# Patient Record
Sex: Male | Born: 1970 | Race: Black or African American | Hispanic: No | Marital: Married | Smoking: Never smoker
Health system: Southern US, Community
[De-identification: ages and names within clinical notes are randomized; demographics above are authoritative.]

## PROBLEM LIST (undated history)

## (undated) DIAGNOSIS — E785 Hyperlipidemia, unspecified: Secondary | ICD-10-CM

## (undated) DIAGNOSIS — I10 Essential (primary) hypertension: Secondary | ICD-10-CM

## (undated) DIAGNOSIS — R7303 Prediabetes: Secondary | ICD-10-CM

## (undated) DIAGNOSIS — E66812 Obesity, class 2: Secondary | ICD-10-CM

## (undated) HISTORY — DX: Essential (primary) hypertension: I10

## (undated) HISTORY — PX: APPENDECTOMY: SHX54

---
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
## 2010-01-23 ENCOUNTER — Emergency Department: Payer: Self-pay | Admitting: Emergency Medicine

## 2011-09-18 ENCOUNTER — Ambulatory Visit: Payer: Self-pay

## 2013-01-31 ENCOUNTER — Ambulatory Visit: Payer: Self-pay | Admitting: Family Medicine

## 2013-02-14 ENCOUNTER — Ambulatory Visit: Payer: Self-pay | Admitting: Family Medicine

## 2013-04-18 ENCOUNTER — Ambulatory Visit: Payer: Self-pay | Admitting: Family Medicine

## 2013-08-25 ENCOUNTER — Ambulatory Visit: Payer: Self-pay | Admitting: General Surgery

## 2013-09-06 ENCOUNTER — Ambulatory Visit: Payer: Self-pay | Admitting: General Surgery

## 2013-09-29 ENCOUNTER — Telehealth: Payer: Self-pay | Admitting: *Deleted

## 2013-09-29 NOTE — Telephone Encounter (Signed)
No show for appt. 

## 2014-04-18 ENCOUNTER — Encounter: Payer: Self-pay | Admitting: *Deleted

## 2014-04-26 ENCOUNTER — Encounter: Payer: Self-pay | Admitting: General Surgery

## 2014-04-26 ENCOUNTER — Ambulatory Visit (INDEPENDENT_AMBULATORY_CARE_PROVIDER_SITE_OTHER): Payer: BC Managed Care – PPO | Admitting: General Surgery

## 2014-04-26 VITALS — BP 134/80 | HR 66 | Resp 14 | Ht 70.0 in | Wt 252.0 lb

## 2014-04-26 DIAGNOSIS — K4090 Unilateral inguinal hernia, without obstruction or gangrene, not specified as recurrent: Secondary | ICD-10-CM

## 2014-04-26 NOTE — Progress Notes (Signed)
Patient ID: Jeffrey ModenaCalvin L Osmon, male   DOB: 1970-12-08, [age] y.o.   MRN: [id number]  Chief Complaint  Patient presents with  . Other    Evaluation of inguinal hernia    HPI Jeffrey Rivers is a 43 y.o. male who presents for an evaluation of an inguinal hernia. The patient states he noticed the hernia approximately 1 year ago. He states it has gotten larger. He has some irritation in that area from time to time. He also has an occasional burning sensation in that area. He is able to push the bulge back in.   HPI  Past Medical History  Diagnosis Date  . Hypertension     Past Surgical History  Procedure Laterality Date  . Appendectomy      Family History  Problem Relation Age of Onset  . Cancer Father 9960    colon    Social History History  Substance Use Topics  . Smoking status: Never Smoker   . Smokeless tobacco: Never Used  . Alcohol Use: No    No Known Allergies  Current Outpatient Prescriptions  Medication Sig Dispense Refill  . lisinopril (PRINIVIL,ZESTRIL) 40 MG tablet Take 1 tablet by mouth daily.  5  . tamsulosin (FLOMAX) 0.4 MG CAPS capsule Take 0.4 mg by mouth daily.   5   No current facility-administered medications for this visit.    Review of Systems Review of Systems  Constitutional: Negative.   Respiratory: Negative.   Cardiovascular: Negative.   Gastrointestinal: Negative.     Blood pressure 134/80, pulse 66, resp. rate 14, height 5\' 10"  (1.778 m), weight 252 lb (114.306 kg).  Physical Exam Physical Exam  Constitutional: He is oriented to person, place, and time. He appears well-developed and well-nourished.  Eyes: Conjunctivae are normal. No scleral icterus.  Neck: Neck supple. No thyromegaly present.  Cardiovascular: Normal rate, regular rhythm and normal heart sounds.   No murmur heard. Pulmonary/Chest: Effort normal and breath sounds normal.  Abdominal: Soft. Normal appearance and bowel sounds are normal. There is no hepatosplenomegaly. There  is no tenderness. A hernia is present. Hernia confirmed positive in the right inguinal area.    Lymphadenopathy:    He has no cervical adenopathy.  Neurological: He is alert and oriented to person, place, and time.  Skin: Skin is warm and dry.    Data Reviewed    Assessment    Right inguinal hernia.      Plan   Recommended repair. Feel open repair is reasonable given the significant scar associated in lower mid line. Procedure, risks and beefits, use of mesh all discussed. Pt is agreeable. [facility].       Sharlette Jansma G 04/27/2014, 6:21 AM

## 2014-04-26 NOTE — Patient Instructions (Addendum)
Open Hernia Repair Open hernia repair is surgery to fix a hernia. A hernia occurs when an internal organ or tissue pushes out through a weak spot in the abdominal wall muscles. Hernias commonly occur in the groin and around the navel. Most hernias tend to get worse over time. Surgery is often done to prevent the hernia from getting bigger, becoming uncomfortable, or becoming an emergency. Emergency surgery may be needed if abdominal contents get stuck in the opening (incarcerated hernia) or the blood supply gets cut off (strangulated hernia). In an open repair, a large cut (incision) is made in the abdomen to perform the surgery. LET [facility] CARE PROVIDER KNOW ABOUT:  Any allergies you have.  All medicines you are taking, including vitamins, herbs, eye drops, creams, and over-the-counter medicines.  Previous problems you or members of your family have had with the use of anesthetics.  Any blood disorders you have.  Previous surgeries you have had.  Medical conditions you have. RISKS AND COMPLICATIONS Generally, this is a safe procedure. However, as with any procedure, complications can occur. Possible complications include:  Infection.  Bleeding.  Nerve injury.  Chronic pain.  The hernia can come back.  Injury to the intestines. BEFORE THE PROCEDURE  Ask your health care provider about changing or stopping any regular medicines. Avoid taking aspirin or blood thinners as directed by your health care provider.  Do noteat or drink anything after midnight the night before surgery.  If you smoke, do not smoke for at least 2 weeks before your surgery.  Do not drink alcohol the day before your surgery.  Let your health care provider know if you develop a cold or any infection before your surgery.  Arrange for someone to drive you home after the procedure or after your hospital stay. Also arrange for someone to help you with activities during recovery. PROCEDURE   Small  monitors will be put on your body. They are used to check your heart, blood pressure, and oxygen level.   An IV access tube will be put into one of your veins. Medicine will be able to flow directly into your body through this IV tube.   You might be given a medicine to help you relax (sedative).   You will be given a medicine to make you sleep (general anesthetic). A breathing tube may be placed into your lungs during the procedure.  A cut (incision) is made over the hernia defect, and the contents are pushed back into the abdomen.  If the hernia is small, stitches may be used to bring the muscle edges back together.  Typically, a surgeon will place a mesh patch made of man-made material (synthetic) to cover the defect. The mesh is sewn to healthy muscle. This reduces the risk of the hernia coming back.  The tissue and skin over the hernia are then closed with stitches or staples.  If the hernia was large, a drain may be left in place to collect excess fluid where the hernia used to be.  Bandages (dressings) are used to cover the incision. AFTER THE PROCEDURE  You will be taken to a recovery area where your progress will be monitored.  If the hernia was small or in the groin (inguinal) region, you will likely be allowed to go home once you are awake, stable, and taking fluids well.  If the hernia was large, you may have to wait for your bowel function to return. You may need to stay in the hospital  for 2-3 days until you can eat and your pain is controlled. A drain may be left in place for 5-7 days. You will be taught how to care for the drain. Document Released: 11/26/2000 Document Revised: 03/23/2013 Document Reviewed: 01/12/2013 [facility] Patient Information 2015 [employer] [place]. This information is not intended to replace advice given to you by your health care provider. Make sure you discuss any questions you have with your health care provider.  Patient[facility] has been  [facility].

## 2014-04-27 ENCOUNTER — Encounter: Payer: Self-pay | Admitting: General Surgery

## 2014-05-26 ENCOUNTER — Other Ambulatory Visit: Payer: Self-pay | Admitting: General Surgery

## 2014-05-26 DIAGNOSIS — K4090 Unilateral inguinal hernia, without obstruction or gangrene, not specified as recurrent: Secondary | ICD-10-CM

## 2014-06-05 ENCOUNTER — Ambulatory Visit: Payer: Self-pay | Admitting: General Surgery

## 2014-06-05 ENCOUNTER — Encounter: Payer: Self-pay | Admitting: *Deleted

## 2014-06-05 DIAGNOSIS — K4090 Unilateral inguinal hernia, without obstruction or gangrene, not specified as recurrent: Secondary | ICD-10-CM

## 2014-06-05 HISTORY — PX: INGUINAL HERNIA REPAIR: SHX194

## 2014-06-05 HISTORY — PX: HERNIA REPAIR: SHX51

## 2014-06-22 ENCOUNTER — Ambulatory Visit (INDEPENDENT_AMBULATORY_CARE_PROVIDER_SITE_OTHER): Payer: Self-pay | Admitting: General Surgery

## 2014-06-22 ENCOUNTER — Encounter: Payer: Self-pay | Admitting: General Surgery

## 2014-06-22 VITALS — BP 140/90 | HR 76 | Resp 12 | Ht 70.0 in | Wt 249.0 lb

## 2014-06-22 DIAGNOSIS — K4090 Unilateral inguinal hernia, without obstruction or gangrene, not specified as recurrent: Secondary | ICD-10-CM

## 2014-06-22 NOTE — Patient Instructions (Addendum)
Patient to return in one month. Resume activities as tolerated.

## 2014-06-22 NOTE — Progress Notes (Signed)
This is a 44 year old male here today for his post op right inguinal hernia done on 06/05/14. Patient states he is doing well.  The right inguinal area is clean and healing well.Repair is intact. Patient to return on one month. Resume activities as tolerated.

## 2014-06-23 ENCOUNTER — Ambulatory Visit: Payer: Self-pay | Admitting: Family Medicine

## 2014-06-26 ENCOUNTER — Encounter: Payer: Self-pay | Admitting: *Deleted

## 2014-07-19 ENCOUNTER — Telehealth: Payer: Self-pay | Admitting: *Deleted

## 2014-07-19 NOTE — Telephone Encounter (Signed)
Left message for patient reminder appointment 07/20/14.

## 2014-07-20 ENCOUNTER — Encounter: Payer: Self-pay | Admitting: General Surgery

## 2014-07-20 ENCOUNTER — Ambulatory Visit (INDEPENDENT_AMBULATORY_CARE_PROVIDER_SITE_OTHER): Payer: Self-pay | Admitting: General Surgery

## 2014-07-20 VITALS — BP 130/80 | HR 76 | Resp 12 | Ht 70.0 in | Wt 252.0 lb

## 2014-07-20 DIAGNOSIS — K4090 Unilateral inguinal hernia, without obstruction or gangrene, not specified as recurrent: Secondary | ICD-10-CM

## 2014-07-20 NOTE — Progress Notes (Signed)
This is a 44 year old male here today for his post op right inguinal hernia done on 06/05/14. Patient states he is doing well.  The right inguinal area is clean and well healed. Repair is intact. Patient to return as needed. No activity restrictions.

## 2014-07-20 NOTE — Patient Instructions (Signed)
Patient to return as needed. 

## 2014-10-07 NOTE — Op Note (Signed)
PATIENT NAME:  Jeffrey Rivers, Jeffrey Rivers MR#:  [id number] DATE OF BIRTH:  Aug 13, 1970  DATE OF PROCEDURE:  06/05/2014  DATE OF PROCEDURE: 06/05/2014.   PREOPERATIVE DIAGNOSIS: Right inguinal hernia.   POSTOPERATIVE DIAGNOSIS: Right inguinal hernia.   OPERATION: Repair right inguinal hernia.   ANESTHESIA: General.   COMPLICATIONS: None.   ESTIMATED BLOOD LOSS: Minimal.   DRAINS: None.   DESCRIPTION OF PROCEDURE: The patient was put to sleep in the supine position on the operating room table. The right inguinal region was prepped and draped out as a sterile field. A timeout was performed. The landmarks were marked in the anterior/superior iliac spine and the pubic tubercle and 0.5% Marcaine with 1% Xylocaine was instilled along the inguinal canal and along the medial two thirds skin incision was made. It was then deepened through a fairly substantial layer of fatty tissue constituting the Camper's fascia and then the Scarpa fascia. Bleeding was controlled with cautery and ligatures of 3-0 Vicryl. The external oblique was exposed and was then opened along the line of its fibers through the external ring. Exploration revealed that the patient, in fact, had a small lipoma on an indirect sac but did not have a direct-type hernia. This lipoma and hernia were then freed and a high ligation with 2-0 Vicryl stitch was performed and amputated. This suture was used to transfix the stump to the undersurface of the internal oblique. The fascia covering the cord was reapproximated with 2-0 Vicryl. After ensuring proper anatomical landmarks it was noted that the ilioinguinal nerve was at a high level and did not come into the area of repair.  A Parietex  Pro-Grip mesh was then brought up and traced around the internal ring was snugged down to the posterior wall of the inguinal canal, tucked underneath the external oblique laterally. The medial end was tacked to the pubic tubercle with a single 2-0 PDS stitch. The external  oblique was closed over with a running 2-0 PDS. The wound was irrigated and closed in layers with 3-0 Vicryl and the skin with subcuticular 4-0 Vicryl, covered with LiquiBand.   The procedure was well tolerated. He was subsequently extubated and returned to the recovery room in stable condition   ____________________________ S.Wynona LunaG. Detra Bores, MD sgs:at D: 06/05/2014 08:47:09 ET T: 06/05/2014 08:58:41 ET JOB#: [id number]  cc: Timoteo ExposeS.G. Evette CristalSankar, MD, <Dictator> [facility] Wynona LunaG Tomeka Kantner MD ELECTRONICALLY SIGNED 06/06/2014 8:44

## 2015-04-12 ENCOUNTER — Other Ambulatory Visit: Payer: Self-pay | Admitting: Family Medicine

## 2015-04-12 NOTE — Telephone Encounter (Signed)
I am forwarding this encounter to the designated PCP and/or their nursing staff for further management of the tasks requested. Thank you.  

## 2015-08-23 ENCOUNTER — Emergency Department: Payer: 59

## 2015-08-23 ENCOUNTER — Encounter: Payer: Self-pay | Admitting: Emergency Medicine

## 2015-08-23 ENCOUNTER — Emergency Department
Admission: EM | Admit: 2015-08-23 | Discharge: 2015-08-23 | Disposition: A | Discharge status: Home / Self Care | Payer: 59 | Attending: Emergency Medicine | Admitting: Emergency Medicine

## 2015-08-23 DIAGNOSIS — I10 Essential (primary) hypertension: Secondary | ICD-10-CM | POA: Diagnosis not present

## 2015-08-23 DIAGNOSIS — R42 Dizziness and giddiness: Secondary | ICD-10-CM | POA: Diagnosis not present

## 2015-08-23 DIAGNOSIS — M25512 Pain in left shoulder: Secondary | ICD-10-CM | POA: Insufficient documentation

## 2015-08-23 DIAGNOSIS — Z79899 Other long term (current) drug therapy: Secondary | ICD-10-CM | POA: Diagnosis not present

## 2015-08-23 LAB — CBC
HCT: 44.4 % (ref 40.0–52.0)
Hemoglobin: 14.8 g/dL (ref 13.0–18.0)
MCH: 28 pg (ref 26.0–34.0)
MCHC: 33.5 g/dL (ref 32.0–36.0)
MCV: 83.5 fL (ref 80.0–100.0)
Platelets: 184 10*3/uL (ref 150–440)
RBC: 5.31 MIL/uL (ref 4.40–5.90)
RDW: 14.4 % (ref 11.5–14.5)
WBC: 4.5 10*3/uL (ref 3.8–10.6)

## 2015-08-23 LAB — BASIC METABOLIC PANEL
Anion gap: 6 (ref 5–15)
BUN: 17 mg/dL (ref 6–20)
CO2: 28 mmol/L (ref 22–32)
Calcium: 9.3 mg/dL (ref 8.9–10.3)
Chloride: 106 mmol/L (ref 101–111)
Creatinine, Ser: 1.04 mg/dL (ref 0.61–1.24)
GFR calc Af Amer: >60 mL/min (ref >60)
GFR calc non Af Amer: >60 mL/min (ref >60)
Glucose, Bld: 96 mg/dL (ref 65–99)
Potassium: 4.1 mmol/L (ref 3.5–5.1)
Sodium: 140 mmol/L (ref 135–145)

## 2015-08-23 LAB — TROPONIN I: Troponin I: <0.03 ng/mL (ref <0.031)

## 2015-08-23 NOTE — ED Notes (Signed)
Pt reports right shoulder pain x2 days and dizziness today; sent here from urgent care with copy of EKG. Attached to chart.

## 2015-08-23 NOTE — ED Provider Notes (Signed)
[facility] Emergency Department Provider Note  ____________________________________________  Time seen: 7:00 PM  I have reviewed the triage vital signs and the nursing notes.   HISTORY  Chief Complaint Shoulder Pain and Dizziness    HPI Jeffrey Rivers is a 45 y.o. male who complains of left shoulder pain that started about 7:00 AM today. He was just in the superior aspect of the shoulder on the left. Not worse with movement. He was at work doing usual activities when this happened. He has been doing increased weight lifting recently which may be related. He was not in the chest or neck. No headache vision changes.He did feel somewhat dizzy. He drank some water and the dizziness resolved and he felt better. He then took an aspirin and the shoulder pain resolved. He continued to work the rest of the day without difficulty and felt back to normal. He went to urgent care after work were EKG was abnormal so they sent him to the ED for evaluation. He denies any exertional symptoms, nor orthopnea, no pleuritic symptoms. History of hypertension but no diabetes hyperlipidemia smoking or early CAD in his family.     Past Medical History  Diagnosis Date  . Hypertension      There are no active problems to display for this patient.    Past Surgical History  Procedure Laterality Date  . Appendectomy    . Hernia repair Right 06/05/14     Current Outpatient Rx  Name  Route  Sig  Dispense  Refill  . ADVAIR DISKUS 250-50 MCG/DOSE AEPB      INHALE 1 PUFF BY MOUTH TWICE DAILY   1 each   0   . lisinopril (PRINIVIL,ZESTRIL) 40 MG tablet   Oral   Take 1 tablet by mouth daily.      5   . tamsulosin (FLOMAX) 0.4 MG CAPS capsule   Oral   Take 0.4 mg by mouth daily.       5      Allergies Review of patient's allergies indicates no known allergies.   Family History  Problem Relation Age of Onset  . Cancer Father 69    colon    Social  History Social History  Substance Use Topics  . Smoking status: Never Smoker   . Smokeless tobacco: Never Used  . Alcohol Use: No    Review of Systems  Constitutional:   No fever or chills. No weight changes Eyes:   No blurry vision or double vision.  ENT:   No sore throat.  Cardiovascular:   No chest pain. Respiratory:   No dyspnea or cough. Gastrointestinal:   Negative for abdominal pain, vomiting and diarrhea.  No BRBPR or melena. Genitourinary:   Negative for dysuria or difficulty urinating. Musculoskeletal:   Left shoulder pain as above. Skin:   Negative for rash. Neurological:   Negative for headaches, focal weakness or numbness. Psychiatric:  No anxiety or depression.   Endocrine:  No changes in energy or sleep difficulty.  10-point ROS otherwise negative.  ____________________________________________   PHYSICAL EXAM:  VITAL SIGNS: ED Triage Vitals  Enc Vitals Group     BP 08/23/15 1853 135/96 mmHg     Pulse Rate 08/23/15 1853 65     Resp 08/23/15 1853 18     Temp 08/23/15 1853 97.9 F (36.6 C)     Temp Source 08/23/15 1853 Oral     SpO2 08/23/15 1853 100 %     Weight 08/23/15 1853  250 lb (113.399 kg)     Height 08/23/15 1853 5\' 10"  (1.778 m)     Head Cir --      Peak Flow --      Pain Score 08/23/15 1801 0     Pain Loc --      Pain Edu? --      Excl. in GC? --     Vital signs reviewed, nursing assessments reviewed.   Constitutional:   Alert and oriented. Well appearing and in no distress. Eyes:   No scleral icterus. No conjunctival pallor. PERRL. EOMI ENT   Head:   Normocephalic and atraumatic.   Nose:   No congestion/rhinnorhea. No septal hematoma   Mouth/Throat:   MMM, no pharyngeal erythema. No peritonsillar mass.    Neck:   No stridor. No SubQ emphysema. No meningismus. No bruit Hematological/Lymphatic/Immunilogical:   No cervical lymphadenopathy. Cardiovascular:   RRR. Symmetric bilateral radial and DP pulses.  No murmurs.   Respiratory:   Normal respiratory effort without tachypnea nor retractions. Breath sounds are clear and equal bilaterally. No wheezes/rales/rhonchi. Musculoskeletal:   Nontender with normal range of motion in all extremities. No joint effusions.  No lower extremity tenderness.  No edema. No shoulder tenderness. No pain with axial loading of the C-spine. Neurologic:   Normal speech and language.  CN 2-10 normal. Motor grossly intact. No gross focal neurologic deficits are appreciated.   Psychiatric:   Mood and affect are normal. ____________________________________________    LABS (pertinent positives/negatives) (all labs ordered are listed, but only abnormal results are displayed) Labs Reviewed  BASIC METABOLIC PANEL  TROPONIN I  CBC   ____________________________________________   EKG  Interpreted by me Normal sinus rhythm rate of 62, normal axis, normal intervals. High voltage QRS complexes. Criteria for LVH. Normal ST segments. Isolated T-wave inversion in lead 3 which is nonspecific and is likely repolarization abnormality in setting of LVH  ____________________________________________    RADIOLOGY  Chest x-ray unremarkable  ____________________________________________   PROCEDURES   ____________________________________________   INITIAL IMPRESSION / ASSESSMENT AND PLAN / ED COURSE  Pertinent labs & imaging results that were available during my care of the patient were reviewed by me and considered in my medical decision making (see chart for details).  Patient presents with vague left shoulder pain in the superior aspect. Nonreproducible on exam, but does not sound at all like anything related to his cardiovascular system, lungs, or a neurologic event. He is well-appearing, no acute distress. Symptoms resolved as of 9 AM, at least 9 hours prior to ED evaluation. Labs chest x-ray EKG are all unremarkable except for LVH. We'll have him follow-up with cardiology  and primary care.     ____________________________________________   FINAL CLINICAL IMPRESSION(S) / ED DIAGNOSES  Final diagnoses:  Shoulder pain, acute, left      Sharman CheekPhillip Earlean Fidalgo, MD 08/23/15 1921

## 2015-08-23 NOTE — ED Notes (Signed)
MD Stafford at bedside. 

## 2015-08-23 NOTE — Discharge Instructions (Signed)
Heat Therapy °Heat therapy can help ease sore, stiff, injured, and tight muscles and joints. Heat relaxes your muscles, which may help ease your pain.  °RISKS AND COMPLICATIONS °If you have any of the following conditions, do not use heat therapy unless your health care provider has approved: °· Poor circulation. °· Healing wounds or scarred skin in the area being treated. °· Diabetes, heart disease, or high blood pressure. °· Not being able to feel (numbness) the area being treated. °· Unusual swelling of the area being treated. °· Active infections. °· Blood clots. °· Cancer. °· Inability to communicate pain. This may include young children and people who have problems with their brain function (dementia). °· Pregnancy. °Heat therapy should only be used on old, pre-existing, or long-lasting (chronic) injuries. Do not use heat therapy on new injuries unless directed by your health care provider. °HOW TO USE HEAT THERAPY °There are several different kinds of heat therapy, including: °· Moist heat pack. °· Warm water bath. °· Hot water bottle. °· Electric heating pad. °· Heated gel pack. °· Heated wrap. °· Electric heating pad. °Use the heat therapy method suggested by your health care provider. Follow your health care provider's instructions on when and how to use heat therapy. °GENERAL HEAT THERAPY RECOMMENDATIONS °· Do not sleep while using heat therapy. Only use heat therapy while you are awake. °· Your skin may turn pink while using heat therapy. Do not use heat therapy if your skin turns red. °· Do not use heat therapy if you have new pain. °· High heat or long exposure to heat can cause burns. Be careful when using heat therapy to avoid burning your skin. °· Do not use heat therapy on areas of your skin that are already irritated, such as with a rash or sunburn. °SEEK MEDICAL CARE IF: °· You have blisters, redness, swelling, or numbness. °· You have new pain. °· Your pain is worse. °MAKE SURE  YOU: °· Understand these instructions. °· Will watch your condition. °· Will get help right away if you are not doing well or get worse. °  °This information is not intended to replace advice given to you by your health care provider. Make sure you discuss any questions you have with your health care provider. °  °Document Released: 08/25/2011 Document Revised: 06/23/2014 Document Reviewed: 07/26/2013 °Elsevier Interactive Patient Education ©2016 [employer] ° °Shoulder Pain °The shoulder is the joint that connects your arms to your body. The bones that form the shoulder joint include the upper arm bone (humerus), the shoulder blade (scapula), and the collarbone (clavicle). The top of the humerus is shaped like a ball and fits into a rather flat socket on the scapula (glenoid cavity). A combination of muscles and strong, fibrous tissues that connect muscles to bones (tendons) support your shoulder joint and hold the ball in the socket. Small, fluid-filled sacs (bursae) are located in different areas of the joint. They act as cushions between the bones and the overlying soft tissues and help reduce friction between the gliding tendons and the bone as you move your arm. Your shoulder joint allows a wide range of motion in your arm. This range of motion allows you to do things like scratch your back or throw a ball. However, this range of motion also makes your shoulder more prone to pain from overuse and injury. °Causes of shoulder pain can originate from both injury and overuse and usually can be grouped in the following four categories: °· Redness,   swelling, and pain (inflammation) of the tendon (tendinitis) or the bursae (bursitis). °· Instability, such as a dislocation of the joint. °· Inflammation of the joint (arthritis). °· Broken bone (fracture). °HOME CARE INSTRUCTIONS  °· Apply ice to the sore area. °¨ Put ice in a plastic bag. °¨ Place a towel between your skin and the bag. °¨ Leave the ice on for 15-20  minutes, 3-4 times per day for the first 2 days, or as directed by your health care provider. °· Stop using cold packs if they do not help with the pain. °· If you have a shoulder sling or immobilizer, wear it as long as your caregiver instructs. Only remove it to shower or bathe. Move your arm as little as possible, but keep your hand moving to prevent swelling. °· Squeeze a soft ball or foam pad as much as possible to help prevent swelling. °· Only take over-the-counter or prescription medicines for pain, discomfort, or fever as directed by your caregiver. °SEEK MEDICAL CARE IF:  °· Your shoulder pain increases, or new pain develops in your arm, hand, or fingers. °· Your hand or fingers become cold and numb. °· Your pain is not relieved with medicines. °SEEK IMMEDIATE MEDICAL CARE IF:  °· Your arm, hand, or fingers are numb or tingling. °· Your arm, hand, or fingers are significantly swollen or turn white or blue. °MAKE SURE YOU:  °· Understand these instructions. °· Will watch your condition. °· Will get help right away if you are not doing well or get worse. °  °This information is not intended to replace advice given to you by your health care provider. Make sure you discuss any questions you have with your health care provider. °  °Document Released: 03/12/2005 Document Revised: 06/23/2014 Document Reviewed: 09/25/2014 °Elsevier Interactive Patient Education ©2016 [employer] ° °

## 2015-09-10 ENCOUNTER — Other Ambulatory Visit: Payer: Self-pay

## 2015-09-10 MED ORDER — LISINOPRIL 40 MG PO TABS: 40.0000 mg | ORAL_TABLET | Freq: Every day | ORAL | Status: DC

## 2015-10-08 ENCOUNTER — Encounter: Payer: Self-pay | Admitting: Family Medicine

## 2015-10-17 ENCOUNTER — Encounter: Payer: Self-pay | Admitting: Family Medicine

## 2015-11-02 ENCOUNTER — Other Ambulatory Visit: Payer: Self-pay | Admitting: Family Medicine

## 2015-11-02 NOTE — Telephone Encounter (Signed)
Patient requesting refill. 

## 2015-11-05 ENCOUNTER — Encounter: Payer: Self-pay | Admitting: Family Medicine

## 2015-11-05 ENCOUNTER — Ambulatory Visit (INDEPENDENT_AMBULATORY_CARE_PROVIDER_SITE_OTHER): Payer: 59 | Admitting: Family Medicine

## 2015-11-05 VITALS — BP 118/78 | HR 72 | Temp 97.8°F | Resp 14 | Wt 249.0 lb

## 2015-11-05 DIAGNOSIS — I517 Cardiomegaly: Secondary | ICD-10-CM | POA: Diagnosis not present

## 2015-11-05 DIAGNOSIS — R55 Syncope and collapse: Secondary | ICD-10-CM

## 2015-11-05 DIAGNOSIS — R39198 Other difficulties with micturition: Secondary | ICD-10-CM | POA: Diagnosis not present

## 2015-11-05 DIAGNOSIS — Z5181 Encounter for therapeutic drug level monitoring: Secondary | ICD-10-CM | POA: Diagnosis not present

## 2015-11-05 DIAGNOSIS — R000 Tachycardia, unspecified: Secondary | ICD-10-CM

## 2015-11-05 DIAGNOSIS — Z13220 Encounter for screening for lipoid disorders: Secondary | ICD-10-CM

## 2015-11-05 DIAGNOSIS — R9431 Abnormal electrocardiogram [ECG] [EKG]: Secondary | ICD-10-CM | POA: Diagnosis not present

## 2015-11-05 DIAGNOSIS — I10 Essential (primary) hypertension: Secondary | ICD-10-CM | POA: Diagnosis not present

## 2015-11-05 DIAGNOSIS — Z125 Encounter for screening for malignant neoplasm of prostate: Secondary | ICD-10-CM | POA: Insufficient documentation

## 2015-11-05 HISTORY — DX: Essential (primary) hypertension: I10

## 2015-11-05 MED ORDER — LISINOPRIL 40 MG PO TABS: 40.0000 mg | ORAL_TABLET | Freq: Every day | ORAL | Status: DC

## 2015-11-05 MED ORDER — TAMSULOSIN HCL 0.4 MG PO CAPS: 0.4000 mg | ORAL_CAPSULE | Freq: Every day | ORAL | Status: DC

## 2015-11-05 NOTE — Progress Notes (Signed)
BP 118/78 mmHg  Pulse 72  Temp(Src) 97.8 F (36.6 C) (Oral)  Resp 14  Wt 249 lb (112.946 kg)  SpO2 96%   Subjective:    Patient ID: Jeffrey Rivers, male    DOB: [birth year], 45 y.o.   MRN: [id number]  HPI: Jeffrey Rivers is a 45 y.o. male  Chief Complaint  Patient presents with  . 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; no chest pain No chest pain; he is having SHOB No leg edema He knows he needs to lose weight EKG done today; see copy on the chart; interpreted by me; compared to previous, relatively unchanged from last ER EKG  ER note reviewed; he was on Advair in Nov/Dec but not taking for months CBC, tropinin I, BMP all normal in the ER No hx of scarlet fever; no one in the family  [facility] 2[facility] 11/05/2015  Decreased Interest 0  Down, Depressed, Hopeless 0  PHQ - 2 Score 0   Relevant past medical, surgical, family and social history reviewed Past Medical History  Diagnosis Date  . Hypertension   . Essential hypertension, benign 11/05/2015   Past Surgical History  Procedure Laterality Date  . Appendectomy    . Hernia repair Right 06/05/14   Family History  Problem Relation Age of Onset  . Cancer Father 4860    colon  negative for heart disease Diabetes - uncles Social History  Substance Use Topics  . Smoking status: Never Smoker   . Smokeless tobacco: Never Used  . Alcohol Use: No   Interim  medical history since last visit reviewed. Allergies and medications reviewed  Review of Systems Per HPI unless specifically indicated above     Objective:    BP 118/78 mmHg  Pulse 72  Temp(Src) 97.8 F (36.6 C) (Oral)  Resp 14  Wt 249 lb (112.946 kg)  SpO2 96%  Wt Readings from Last 3 Encounters:  11/05/15 249 lb (112.946 kg)  08/23/15 250 lb (113.399 kg)  07/20/14 252 lb (114.306 kg)   body mass index is 35.73 kg/(m^2).  Physical Exam  Constitutional: He appears well-developed and well-nourished. No distress.  obese  HENT:  Head: Normocephalic and atraumatic.  Eyes: EOM are normal. No scleral icterus.  Neck: Carotid bruit is not present. No thyromegaly present.  Cardiovascular: Normal rate and regular rhythm.   No extrasystoles are present. PMI is not displaced.  Exam reveals no gallop.   No murmur heard. Pulses:      Radial pulses are 2+ on the right side, and 2+ on the left side.  Pulmonary/Chest: Effort normal and breath sounds normal.  Abdominal: Soft. Bowel sounds are normal. He exhibits no distension.  Musculoskeletal: He exhibits no edema.  Neurological: He is alert. He displays no tremor. No cranial nerve deficit. Coordination and gait normal.  Skin: Skin is warm and dry. He is  not diaphoretic. No pallor.  Psychiatric: He has a normal mood and affect. His behavior is normal. Judgment and thought content normal. His mood appears not anxious. He does not exhibit a depressed mood.      Assessment & Plan:   Problem List Items Addressed This Visit      Cardiovascular and Mediastinum   Essential hypertension, benign    Controlled today; weight loss and DASH guidelines would be helpful      Relevant Medications   lisinopril (PRINIVIL,ZESTRIL) 40 MG tablet   Other Relevant Orders   Basic metabolic panel (Completed)   LVH (left ventricular hypertrophy)    And abnormal EKG; i am concerned about the possible of LVOT obstruction causing his symptoms; urgent  referral to cardiology; no strenuous activities, including energetic preaching; reasons to seek medical attention, call 911 reviewed; patient agrees      Relevant Medications   lisinopril (PRINIVIL,ZESTRIL) 40 MG tablet   Other Relevant Orders   Ambulatory referral to Cardiology     Other   Medication monitoring encounter   Relevant Orders   Basic metabolic panel (Completed)   Prostate cancer screening   Relevant Orders   PSA (Completed)   Tachycardia   Relevant Orders   TSH (Completed)    Other Visit Diagnoses    Syncope, near    -  Primary    which sounds more cardiogenic in nature, given the abnormal EKG; refer to cardiologist for urgent work-up, echo; no strenuous activities in the meantime    Relevant Medications    lisinopril (PRINIVIL,ZESTRIL) 40 MG tablet    Other Relevant Orders    EKG 12-Lead    Ambulatory referral to Cardiology    Abnormal EKG        Relevant Orders    Ambulatory referral to Cardiology    Basic metabolic panel (Completed)    TSH (Completed)    Slow urinary stream        Relevant Orders    PSA (Completed)    Lipid screening        Relevant Orders    Lipid Panel w/o Chol/HDL Ratio (Completed)       Follow up plan: Return in about 2 weeks (around 11/19/2015).  An after-visit summary was printed and given to the patient at check-out.  Please see the patient instructions which may contain other information and recommendations beyond what is mentioned above in the assessment and plan.  Meds ordered this encounter  Medications  . lisinopril (PRINIVIL,ZESTRIL) 40 MG tablet    Sig: Take 1 tablet (40 mg total) by mouth daily.    Dispense:  30 tablet    Refill:  5  . tamsulosin (FLOMAX) 0.4 MG CAPS capsule    Sig: Take 1 capsule (0.4 mg total) by mouth daily.    Dispense:  30 capsule    Refill:  5   Orders Placed This Encounter  Procedures  . Basic metabolic panel  . Lipid Panel w/o Chol/HDL Ratio  . PSA  . TSH  . Ambulatory referral to  Cardiology  . EKG 12-Lead

## 2015-11-05 NOTE — Patient Instructions (Addendum)
We'll have you see the cardiologist today or tomorrow Limit your physical activity and stress; do not exert yourself physically until cleared by cardiologist If you feel light-headed, stop what you doing, sit down, drink fluids If you develop chest pain, then call 911 Let's get bloodwork today If you have not heard anything from my staff by tomorrow at lunch about any orders/referrals/studies from today, please contact us here to follow-up [phone]

## 2015-11-06 ENCOUNTER — Ambulatory Visit: Payer: Self-pay | Admitting: Family Medicine

## 2015-11-06 ENCOUNTER — Telehealth: Payer: Self-pay | Admitting: Family Medicine

## 2015-11-06 LAB — LIPID PANEL W/O CHOL/HDL RATIO
Cholesterol, Total: 217 mg/dL — ABNORMAL HIGH (ref 100–199)
HDL: 59 mg/dL (ref >39)
LDL Calculated: 146 mg/dL — ABNORMAL HIGH (ref 0–99)
Triglycerides: 62 mg/dL (ref 0–149)
VLDL Cholesterol Cal: 12 mg/dL (ref 5–40)

## 2015-11-06 LAB — BASIC METABOLIC PANEL
BUN/Creatinine Ratio: 15 (ref 9–20)
BUN: 16 mg/dL (ref 6–24)
CO2: 23 mmol/L (ref 18–29)
Calcium: 9.5 mg/dL (ref 8.7–10.2)
Chloride: 101 mmol/L (ref 96–106)
Creatinine, Ser: 1.08 mg/dL (ref 0.76–1.27)
GFR calc Af Amer: 97 mL/min/{1.73_m2} (ref >59)
GFR calc non Af Amer: 84 mL/min/{1.73_m2} (ref >59)
Glucose: 95 mg/dL (ref 65–99)
Potassium: 4.4 mmol/L (ref 3.5–5.2)
Sodium: 141 mmol/L (ref 134–144)

## 2015-11-06 LAB — TSH: TSH: 1.19 u[IU]/mL (ref 0.450–4.500)

## 2015-11-06 LAB — PSA: Prostate Specific Ag, Serum: 0.5 ng/mL (ref 0.0–4.0)

## 2015-11-06 NOTE — Telephone Encounter (Signed)
Checking status on lab results, please return call

## 2015-11-07 NOTE — Telephone Encounter (Signed)
I spoke with patient; he did a holter monitor; he has not heard anything back yet; they did not do an echo (?); that was specifically in my referral note I talked with patient about labs; cholesterol modestly elevated (total and LDL) but excellent HDL; he'll try to lose a little weight and cut down on saturated fats; recheck in 3-6 months ----------------- Jeffrey Rivers, Please call cardiology office; I was really hoping that they would get an echocardiogram; I'm concerned about possible LVOT obstruction If they are not planning on getting an echo, then let's order one to be done ASAP Request their note too, please; I don't see him in CareEverywhere; thank you

## 2015-11-08 NOTE — Telephone Encounter (Signed)
They are faxing not and he has an echo and stress test scheduled on the 16th

## 2015-11-08 NOTE — Telephone Encounter (Signed)
Thank you :)

## 2015-11-17 NOTE — Assessment & Plan Note (Addendum)
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

## 2015-11-17 NOTE — Assessment & Plan Note (Signed)
Controlled today; weight loss and DASH guidelines would be helpful

## 2015-11-19 ENCOUNTER — Ambulatory Visit: Payer: 59 | Admitting: Family Medicine

## 2015-11-29 ENCOUNTER — Ambulatory Visit (INDEPENDENT_AMBULATORY_CARE_PROVIDER_SITE_OTHER): Payer: 59 | Admitting: Family Medicine

## 2015-11-29 ENCOUNTER — Encounter: Payer: Self-pay | Admitting: Family Medicine

## 2015-11-29 VITALS — BP 112/82 | HR 65 | Temp 97.8°F | Resp 18 | Ht 70.0 in | Wt 249.2 lb

## 2015-11-29 DIAGNOSIS — N138 Other obstructive and reflux uropathy: Secondary | ICD-10-CM | POA: Insufficient documentation

## 2015-11-29 DIAGNOSIS — N401 Enlarged prostate with lower urinary tract symptoms: Secondary | ICD-10-CM

## 2015-11-29 DIAGNOSIS — I10 Essential (primary) hypertension: Secondary | ICD-10-CM

## 2015-11-29 DIAGNOSIS — I517 Cardiomegaly: Secondary | ICD-10-CM

## 2015-11-29 NOTE — Progress Notes (Signed)
BP 112/82 mmHg  Pulse 65  Temp(Src) 97.8 F (36.6 C) (Oral)  Resp 18  Ht  (1.778 m)  Wt 249 lb 3.2 oz (113.036 kg)  BMI 35.76 kg/m2  SpO2 98%   Subjective:    Patient ID: Jeffrey Rivers, male    DOB: 19-Jun-1970, 45 y.o.   MRN: [id number]  HPI: Jeffrey Rivers is a 45 y.o. male  Chief Complaint  Patient presents with  . Hypertension    has not taken medication in 1 month. Makes him dizzy, drained, has cardiology appoinment tomorrow   He has been feeling good off of the medicine Not dizzy and not light-headed No chest pain No swelling in the legs Quit medicine about a month ago; no bad reactions Does some fast food, some canned beans No decongestants  Went to see cardiologist; stress test pending Supposed to have stress test tomorrow but will reschedule Not much physical work Tested for sleep apnea already last year, weight same as last year Going on vacation next week He would love to lose 10-20 pounds  Mentions BPH; will refer to urology  Depression screen [facility] 2/9 11/29/2015 11/05/2015  Decreased Interest 0 0  Down, Depressed, Hopeless 0 0  PHQ - 2 Score 0 0   Relevant past medical, surgical, family and social history reviewed Past Medical History  Diagnosis Date  . Hypertension   . Essential hypertension, benign 11/05/2015   Social History  Substance Use Topics  . Smoking status: Never Smoker   . Smokeless tobacco: Never Used  . Alcohol Use: No   Interim medical history since last visit reviewed. Allergies and medications reviewed  Review of Systems Per HPI unless specifically indicated above     Objective:    BP 112/82 mmHg  Pulse 65  Temp(Src) 97.8 F (36.6 C) (Oral)  Resp 18  Ht  (1.778 m)  Wt 249 lb 3.2 oz (113.036 kg)  BMI 35.76 kg/m2  SpO2 98%  Wt Readings from Last 3 Encounters:  11/29/15 249 lb 3.2 oz (113.036 kg)  11/05/15 249 lb (112.946 kg)  08/23/15 250 lb (113.399 kg)    Physical Exam  Constitutional: He appears  well-developed and well-nourished. No distress.  Weight stable  Cardiovascular: Normal rate and regular rhythm.   Pulmonary/Chest: Effort normal and breath sounds normal.  Abdominal: He exhibits no distension.  Musculoskeletal: He exhibits no edema.  Psychiatric: He has a normal mood and affect.  Vitals reviewed.     Assessment & Plan:   Problem List Items Addressed This Visit      Cardiovascular and Mediastinum   LVH (left ventricular hypertrophy)    Seeing cardiologist; BP controlled today; so important to work on weight loss and he'll try to lose 10-20 pounds over the upcoming few months      Essential hypertension, benign - Primary    Controlled today; ork on weight loss and DASH guidelines        Genitourinary   BPH (benign prostatic hypertrophy) with urinary obstruction    Refer to urologist      Relevant Orders   Ambulatory referral to Urology      Follow up plan: Return in about 3 months (around 02/29/2016) for follow-up.  An after-visit summary was printed and given to the patient at check-out.  Please see the patient instructions which may contain other information and recommendations beyond what is mentioned above in the assessment and plan.  Orders Placed This Encounter  Procedures  .  Ambulatory referral to Urology

## 2015-11-29 NOTE — Patient Instructions (Addendum)
Check out the information at familydoctor.org entitled "Nutrition for Weight Loss: What You Need to Know about Fad Diets" Try to lose between 1-2 pounds per week by taking in fewer calories and burning off more calories You can succeed by limiting portions, limiting foods dense in calories and fat, becoming more active, and drinking 8 glasses of water a day (64 ounces) Don't skip meals, especially breakfast, as skipping meals may alter your metabolism Do not use over-the-counter weight loss pills or gimmicks that claim rapid weight loss A healthy BMI (or body mass index) is between 18.5 and 24.9 You can calculate your ideal BMI at the NIH website JobEconomics.hu  No strenuous activity until cleared by cardiologist  We'll have you see the urologist  Your goal blood pressure is less than 140 mmHg on top. Try to follow the DASH guidelines (DASH stands for Dietary Approaches to Stop Hypertension) Try to limit the sodium in your diet.  Ideally, consume less than 1.5 grams (less than 1,500mg ) per day. Do not add salt when cooking or at the table.  Check the sodium amount on labels when shopping, and choose items lower in sodium when given a choice. Avoid or limit foods that already contain a lot of sodium. Eat a diet rich in fruits and vegetables and whole grains.  DASH Eating Plan DASH stands for "Dietary Approaches to Stop Hypertension." The DASH eating plan is a healthy eating plan that has been shown to reduce high blood pressure (hypertension). Additional health benefits may include reducing the risk of type 2 diabetes mellitus, heart disease, and stroke. The DASH eating plan may also help with weight loss. WHAT DO I NEED TO KNOW ABOUT THE DASH EATING PLAN? For the DASH eating plan, you will follow these general guidelines:  Choose foods with a percent daily value for sodium of less than 5% (as listed on the food label).  Use salt-free  seasonings or herbs instead of table salt or sea salt.  Check with your health care provider or pharmacist before using salt substitutes.  Eat lower-sodium products, often labeled as "lower sodium" or "no salt added."  Eat fresh foods.  Eat more vegetables, fruits, and low-fat dairy products.  Choose whole grains. Look for the word "whole" as the first word in the ingredient list.  Choose fish and skinless chicken or [place] more often than red meat. Limit fish, poultry, and meat to 6 oz (170 g) each day.  Limit sweets, desserts, sugars, and sugary drinks.  Choose heart-healthy fats.  Limit cheese to 1 oz (28 g) per day.  Eat more home-cooked food and less restaurant, buffet, and fast food.  Limit fried foods.  Cook foods using methods other than frying.  Limit canned vegetables. If you do use them, rinse them well to decrease the sodium.  When eating at a restaurant, ask that your food be prepared with less salt, or no salt if possible. WHAT FOODS CAN I EAT? Seek help from a dietitian for individual calorie needs. Grains Whole grain or whole wheat bread. Brown rice. Whole grain or whole wheat pasta. Quinoa, bulgur, and whole grain cereals. Low-sodium cereals. Corn or whole wheat flour tortillas. Whole grain cornbread. Whole grain crackers. Low-sodium crackers. Vegetables Fresh or frozen vegetables (raw, steamed, roasted, or grilled). Low-sodium or reduced-sodium tomato and vegetable juices. Low-sodium or reduced-sodium tomato sauce and paste. Low-sodium or reduced-sodium canned vegetables.  Fruits All fresh, canned (in natural juice), or frozen fruits. Meat and Other Protein Products Ground beef (85%  or leaner), grass-fed beef, or beef trimmed of fat. Skinless chicken or [place]. Ground chicken or [place]. Pork trimmed of fat. All fish and seafood. Eggs. Dried beans, peas, or lentils. Unsalted nuts and seeds. Unsalted canned beans. Dairy Low-fat dairy products, such as skim or  1% milk, 2% or reduced-fat cheeses, low-fat ricotta or cottage cheese, or plain low-fat yogurt. Low-sodium or reduced-sodium cheeses. Fats and Oils Tub margarines without trans fats. Light or reduced-fat mayonnaise and salad dressings (reduced sodium). Avocado. Safflower, olive, or canola oils. Natural peanut or almond butter. Other Unsalted popcorn and pretzels. The items listed above may not be a complete list of recommended foods or beverages. Contact your dietitian for more options. WHAT FOODS ARE NOT RECOMMENDED? Grains White bread. White pasta. White rice. Refined cornbread. Bagels and croissants. Crackers that contain trans fat. Vegetables Creamed or fried vegetables. Vegetables in a cheese sauce. Regular canned vegetables. Regular canned tomato sauce and paste. Regular tomato and vegetable juices. Fruits Dried fruits. Canned fruit in light or heavy syrup. Fruit juice. Meat and Other Protein Products Fatty cuts of meat. Ribs, chicken wings, bacon, sausage, bologna, salami, chitterlings, fatback, hot dogs, bratwurst, and packaged luncheon meats. Salted nuts and seeds. Canned beans with salt. Dairy Whole or 2% milk, cream, half-and-half, and cream cheese. Whole-fat or sweetened yogurt. Full-fat cheeses or blue cheese. Nondairy creamers and whipped toppings. Processed cheese, cheese spreads, or cheese curds. Condiments Onion and garlic salt, seasoned salt, table salt, and sea salt. Canned and packaged gravies. Worcestershire sauce. Tartar sauce. Barbecue sauce. Teriyaki sauce. Soy sauce, including reduced sodium. Steak sauce. Fish sauce. Oyster sauce. Cocktail sauce. Horseradish. Ketchup and mustard. Meat flavorings and tenderizers. Bouillon cubes. Hot sauce. Tabasco sauce. Marinades. Taco seasonings. Relishes. Fats and Oils Butter, stick margarine, lard, shortening, ghee, and bacon fat. Coconut, palm kernel, or palm oils. Regular salad dressings. Other Pickles and olives. Salted popcorn  and pretzels. The items listed above may not be a complete list of foods and beverages to avoid. Contact your dietitian for more information. WHERE CAN I FIND MORE INFORMATION? National Heart, Lung, and Blood Institute: CablePromo.itwww.nhlbi.nih.gov/health/health-topics/topics/dash/   This information is not intended to replace advice given to you by your health care provider. Make sure you discuss any questions you have with your health care provider.   Document Released: 05/22/2011 Document Revised: 06/23/2014 Document Reviewed: 04/06/2013 Elsevier Interactive Patient Education [employer]2016 Elsevier Inc.

## 2015-12-15 NOTE — Assessment & Plan Note (Signed)
Seeing cardiologist; BP controlled today; so important to work on weight loss and he'll try to lose 10-20 pounds over the upcoming few months

## 2015-12-15 NOTE — Assessment & Plan Note (Signed)
Controlled today; ork on weight loss and DASH guidelines

## 2015-12-15 NOTE — Assessment & Plan Note (Signed)
Refer to urologist 

## 2015-12-19 ENCOUNTER — Encounter: Payer: Self-pay | Admitting: Urology

## 2015-12-19 ENCOUNTER — Ambulatory Visit (INDEPENDENT_AMBULATORY_CARE_PROVIDER_SITE_OTHER): Payer: 59 | Admitting: Urology

## 2015-12-19 VITALS — BP 152/93 | HR 76 | Ht 70.0 in | Wt 250.0 lb

## 2015-12-19 DIAGNOSIS — R3911 Hesitancy of micturition: Secondary | ICD-10-CM | POA: Diagnosis not present

## 2015-12-19 DIAGNOSIS — N401 Enlarged prostate with lower urinary tract symptoms: Secondary | ICD-10-CM

## 2015-12-19 DIAGNOSIS — N138 Other obstructive and reflux uropathy: Secondary | ICD-10-CM

## 2015-12-19 DIAGNOSIS — I10 Essential (primary) hypertension: Secondary | ICD-10-CM | POA: Insufficient documentation

## 2015-12-19 LAB — URINALYSIS, COMPLETE
Bilirubin, UA: NEGATIVE
Glucose, UA: NEGATIVE
Ketones, UA: NEGATIVE
Leukocytes, UA: NEGATIVE
Nitrite, UA: NEGATIVE
Protein, UA: NEGATIVE
RBC, UA: NEGATIVE
Specific Gravity, UA: >1.03 — ABNORMAL HIGH (ref 1.005–1.030)
Urobilinogen, Ur: 0.2 mg/dL (ref 0.2–1.0)
pH, UA: 5 (ref 5.0–7.5)

## 2015-12-19 LAB — MICROSCOPIC EXAMINATION
Bacteria, UA: NONE SEEN
Epithelial Cells (non renal): NONE SEEN /hpf (ref 0–10)

## 2015-12-19 LAB — BLADDER SCAN AMB NON-IMAGING

## 2015-12-19 MED ORDER — ALFUZOSIN HCL ER 10 MG PO TB24: 10.0000 mg | ORAL_TABLET | Freq: Every day | ORAL | Status: DC

## 2015-12-19 NOTE — Progress Notes (Signed)
Consult: BPH with lower urinary tract symptoms Referred by: Dr. Sherie DonLada  Chief Complaint: Urgency, hesitancy and weak stream  History of Present Illness: Patient reports history of BPH and symptoms of weak stream, frequency, urgency, nocturia, intermittent flow, hesitancy and straining to void. He's had symptoms for several months. He tried tamsulosin which improved his symptoms but gave him dizziness. He denies trouble getting or maintaining an erection. He is most bothered hesitancy and weak stream which follows an urge to void.  AUA symptom score of 23, quality of life unhappy. Symptoms of incomplete emptying, frequency, intermittency, urgency, weak stream, straining, nocturia. Flow symptoms more predominant.   No dysuria or gross hematuria. No NG risk.   PSA was 0.19 Oct 2015. He has no family history of prostate cancer.  UA today normal. 0-5 white cells, 0-2 red cell, no bacteria.  He is married with one child. His wife is 43 yo and they desire no more children.   PVR = 0 ml   Past Medical History  Diagnosis Date  . Hypertension   . Essential hypertension, benign 11/05/2015   Past Surgical History  Procedure Laterality Date  . Appendectomy    . Hernia repair Right 06/05/14    Home Medications:   (Not in a hospital admission) Allergies: No Known Allergies  Family History  Problem Relation Age of Onset  . Cancer Father 4960    colon   Social History:  reports that he has never smoked. He has never used smokeless tobacco. He reports that he does not drink alcohol or use illicit drugs.  ROS: A complete review of systems was performed.  All systems are negative except for pertinent findings as noted. ROS   Physical Exam:  Vital signs in last 24 hours: @VSRANGES @ General:  Alert and oriented, No acute distress HEENT: Normocephalic, atraumatic Cardiovascular: Regular rate and rhythm Lungs: Regular rate and effort Abdomen: Soft, nontender, nondistended, no abdominal  masses Back: No CVA tenderness Extremities: No edema Neurologic: Grossly intact GU: The penis is circumcised and without mass or lesion. The testicles were descended bilaterally and palpably normal. I did not appreciate any inguinal hernias. DRE: The prostate was palpably normal and about 30 g in size. The prostate was smooth and without hard area or nodule.  Laboratory Data:  No results found for this or any previous visit (from the past 24 hour(s)). No results found for this or any previous visit (from the past 240 hour(s)). Creatinine: No results for input(s): CREATININE in the last 168 hours.  Impression/Assessment/plan:  BPH with LUTS  - etiologies include possible urethral stricture, BPH with bladder outlet obstruction, bladder neck obstruction and even overactive bladder. We discussed the nature risks and benefits of Alfuzosin and he elected to try that medicine. He'll follow-up for cystoscopy to evaluate the urethra and the prostate. Depending on his anatomy something like a Urolift or transurethral incision of the bladder neck may be less invasive than other procedures. We did discuss risk of permanent retrograde ejaculation with procedures, but patient desires no more children.   Jeffrey Rivers 12/19/2015, 3:42 PM

## 2016-01-14 ENCOUNTER — Ambulatory Visit (INDEPENDENT_AMBULATORY_CARE_PROVIDER_SITE_OTHER): Payer: 59 | Admitting: Urology

## 2016-01-14 ENCOUNTER — Encounter: Payer: Self-pay | Admitting: Urology

## 2016-01-14 VITALS — BP 133/83 | HR 78 | Ht 70.0 in | Wt 253.0 lb

## 2016-01-14 DIAGNOSIS — N401 Enlarged prostate with lower urinary tract symptoms: Secondary | ICD-10-CM | POA: Diagnosis not present

## 2016-01-14 DIAGNOSIS — N138 Other obstructive and reflux uropathy: Secondary | ICD-10-CM

## 2016-01-14 MED ORDER — CIPROFLOXACIN HCL 500 MG PO TABS
500.0000 mg | ORAL_TABLET | Freq: Once | ORAL | Status: AC
  Administered 2016-01-14: 500 mg via ORAL

## 2016-01-14 MED ORDER — LIDOCAINE HCL 2 % EX GEL
1.0000 "application " | Freq: Once | CUTANEOUS | Status: AC
  Administered 2016-01-14: 1 via URETHRAL

## 2016-01-14 NOTE — Progress Notes (Signed)
Patient returns with h/o BPH and symptoms of weak stream, frequency, urgency, nocturia, intermittent flow, hesitancy and straining to void. He's had symptoms for several months. He tried tamsulosin which improved his symptoms but gave him dizziness. He denies trouble getting or maintaining an erection. He is most bothered hesitancy and weak stream which follows an urge to void. AUA symptom score of 23, quality of life unhappy. Symptoms of incomplete emptying, frequency, intermittency, urgency, weak stream, straining, nocturia. Flow symptoms more predominant. No dysuria or gross hematuria. No NG risk. His PVR was 0 ml.   PSA was 0.19 Oct 2015. He has no family history of prostate cancer. DRE normal Jul 2017.   He is married with one child. His wife is 49 yo and they desire no more children.   Today, patient is seen for the above. He returns for cystoscopy. He tried alfuzosin but it made him feel drowsy and gave him heart palpitations and he stopped. Of note it did improve his weak stream.  Physical exam: No acute distress Alert and oriented No focal neurologic deficits Respiratory-normal rate and effort Extremity-no edema  Procedure: Cystoscopy-after consent was obtained patient was placed supine and prepped and draped in the usual sterile fashion. Lidocaine jelly was instilled per urethra. The cystoscope was passed per urethra and the bladder inspected. Findings: The urethra was normal, the lateral lobes of the prostate were nonobstructing but he had a prominent obstructing median lobe. The trigone and ureteral orifices were in their normal orthotopic position with clear efflux. The bladder was unremarkable.  Assessment/plan: Patient with predominant flow symptoms and although alpha blockers improve his symptoms he cannot tolerate the side effects. He has a prominent median lobe on exam and would likely do well with a TURP as his prostate is not very big but the median lobe appears obstructive. We  discussed the nature risks benefits and alternatives to TURP including continued surveillance, HoLEP and GLPVP. A drama picture the anatomy and we discussed in most cases flow symptoms drastically improve after these procedures and frequency and urgency usually improve but sometimes remain the same or rarely worsen. We also discussed risks of retrograde ejaculation, bleeding, stricture, incontinence and erectile dysfunction among others. He will consider. In the meantime will give him a trial of Rapaflo. He'll return in a few months.

## 2016-01-15 LAB — MICROSCOPIC EXAMINATION
Bacteria, UA: NONE SEEN
Epithelial Cells (non renal): NONE SEEN /hpf (ref 0–10)
WBC, UA: NONE SEEN /hpf (ref 0–?)

## 2016-01-15 LAB — URINALYSIS, COMPLETE
Bilirubin, UA: NEGATIVE
Glucose, UA: NEGATIVE
Ketones, UA: NEGATIVE
Leukocytes, UA: NEGATIVE
Nitrite, UA: NEGATIVE
Protein, UA: NEGATIVE
RBC, UA: NEGATIVE
Specific Gravity, UA: >1.03 — ABNORMAL HIGH (ref 1.005–1.030)
Urobilinogen, Ur: 0.2 mg/dL (ref 0.2–1.0)
pH, UA: 5 (ref 5.0–7.5)

## 2016-02-29 ENCOUNTER — Ambulatory Visit: Payer: 59 | Admitting: Family Medicine

## 2016-03-03 ENCOUNTER — Ambulatory Visit: Payer: 59 | Admitting: Family Medicine

## 2016-03-17 ENCOUNTER — Ambulatory Visit (INDEPENDENT_AMBULATORY_CARE_PROVIDER_SITE_OTHER): Payer: 59 | Admitting: Family Medicine

## 2016-03-17 ENCOUNTER — Encounter: Payer: Self-pay | Admitting: Family Medicine

## 2016-03-17 DIAGNOSIS — N401 Enlarged prostate with lower urinary tract symptoms: Secondary | ICD-10-CM

## 2016-03-17 DIAGNOSIS — I517 Cardiomegaly: Secondary | ICD-10-CM

## 2016-03-17 DIAGNOSIS — Z6836 Body mass index (BMI) 36.0-36.9, adult: Secondary | ICD-10-CM

## 2016-03-17 DIAGNOSIS — R5383 Other fatigue: Secondary | ICD-10-CM | POA: Diagnosis not present

## 2016-03-17 DIAGNOSIS — I10 Essential (primary) hypertension: Secondary | ICD-10-CM | POA: Diagnosis not present

## 2016-03-17 DIAGNOSIS — E6609 Other obesity due to excess calories: Secondary | ICD-10-CM | POA: Diagnosis not present

## 2016-03-17 DIAGNOSIS — E785 Hyperlipidemia, unspecified: Secondary | ICD-10-CM | POA: Diagnosis not present

## 2016-03-17 DIAGNOSIS — N138 Other obstructive and reflux uropathy: Secondary | ICD-10-CM | POA: Diagnosis not present

## 2016-03-17 NOTE — Assessment & Plan Note (Signed)
Check lipids (fasting); limit saturated fats 

## 2016-03-17 NOTE — Assessment & Plan Note (Signed)
Encouraged pt to call cardiologist and see about other testing in light of concerns regarding radiation, such as echo

## 2016-03-17 NOTE — Assessment & Plan Note (Signed)
Check multiple labs; encouraged 7 hours of sleep a night

## 2016-03-17 NOTE — Patient Instructions (Addendum)
Do check with your cardiologist about any other kind of stress testing, if you are concerned about radiation  Return for fasting, first morning labs some time soon  Try to limit saturated fats in your diet (bologna, hot dogs, barbeque, cheeseburgers, hamburgers, steak, bacon, sausage, cheese, etc.) and get more fresh fruits, vegetables, and whole grains  Your goal blood pressure is less than 140 mmHg on top. Try to follow the DASH guidelines (DASH stands for Dietary Approaches to Stop Hypertension) Try to limit the sodium in your diet.  Ideally, consume less than 1.5 grams (less than 1,500mg ) per day. Do not add salt when cooking or at the table.  Check the sodium amount on labels when shopping, and choose items lower in sodium when given a choice. Avoid or limit foods that already contain a lot of sodium. Eat a diet rich in fruits and vegetables and whole grains.  Check out the information at familydoctor.org entitled "Nutrition for Weight Loss: What You Need to Know about Fad Diets" Try to lose between 1-2 pounds per week by taking in fewer calories and burning off more calories You can succeed by limiting portions, limiting foods dense in calories and fat, becoming more active, and drinking 8 glasses of water a day (64 ounces) Don't skip meals, especially breakfast, as skipping meals may alter your metabolism Do not use over-the-counter weight loss pills or gimmicks that claim rapid weight loss A healthy BMI (or body mass index) is between 18.5 and 24.9 You can calculate your ideal BMI at the NIH website JobE[employer]ics.hu   DASH Eating Plan DASH stands for "Dietary Approaches to Stop Hypertension." The DASH eating plan is a healthy eating plan that has been shown to reduce high blood pressure (hypertension). Additional health benefits may include reducing the risk of type 2 diabetes mellitus, heart disease, and stroke. The DASH eating plan may  also help with weight loss. WHAT DO I NEED TO KNOW ABOUT THE DASH EATING PLAN? For the DASH eating plan, you will follow these general guidelines:  Choose foods with a percent daily value for sodium of less than 5% (as listed on the food label).  Use salt-free seasonings or herbs instead of table salt or sea salt.  Check with your health care provider or pharmacist before using salt substitutes.  Eat lower-sodium products, often labeled as "lower sodium" or "no salt added."  Eat fresh foods.  Eat more vegetables, fruits, and low-fat dairy products.  Choose whole grains. Look for the word "whole" as the first word in the ingredient list.  Choose fish and skinless chicken or [place] more often than red meat. Limit fish, poultry, and meat to 6 oz (170 g) each day.  Limit sweets, desserts, sugars, and sugary drinks.  Choose heart-healthy fats.  Limit cheese to 1 oz (28 g) per day.  Eat more home-cooked food and less restaurant, buffet, and fast food.  Limit fried foods.  Cook foods using methods other than frying.  Limit canned vegetables. If you do use them, rinse them well to decrease the sodium.  When eating at a restaurant, ask that your food be prepared with less salt, or no salt if possible. WHAT FOODS CAN I EAT? Seek help from a dietitian for individual calorie needs. Grains Whole grain or whole wheat bread. Brown rice. Whole grain or whole wheat pasta. Quinoa, bulgur, and whole grain cereals. Low-sodium cereals. Corn or whole wheat flour tortillas. Whole grain cornbread. Whole grain crackers. Low-sodium crackers. Vegetables Fresh or frozen  vegetables (raw, steamed, roasted, or grilled). Low-sodium or reduced-sodium tomato and vegetable juices. Low-sodium or reduced-sodium tomato sauce and paste. Low-sodium or reduced-sodium canned vegetables.  Fruits All fresh, canned (in natural juice), or frozen fruits. Meat and Other Protein Products Ground beef (85% or leaner),  grass-fed beef, or beef trimmed of fat. Skinless chicken or [place]. Ground chicken or [place]. Pork trimmed of fat. All fish and seafood. Eggs. Dried beans, peas, or lentils. Unsalted nuts and seeds. Unsalted canned beans. Dairy Low-fat dairy products, such as skim or 1% milk, 2% or reduced-fat cheeses, low-fat ricotta or cottage cheese, or plain low-fat yogurt. Low-sodium or reduced-sodium cheeses. Fats and Oils Tub margarines without trans fats. Light or reduced-fat mayonnaise and salad dressings (reduced sodium). Avocado. Safflower, olive, or canola oils. Natural peanut or almond butter. Other Unsalted popcorn and pretzels. The items listed above may not be a complete list of recommended foods or beverages. Contact your dietitian for more options. WHAT FOODS ARE NOT RECOMMENDED? Grains White bread. White pasta. White rice. Refined cornbread. Bagels and croissants. Crackers that contain trans fat. Vegetables Creamed or fried vegetables. Vegetables in a cheese sauce. Regular canned vegetables. Regular canned tomato sauce and paste. Regular tomato and vegetable juices. Fruits Dried fruits. Canned fruit in light or heavy syrup. Fruit juice. Meat and Other Protein Products Fatty cuts of meat. Ribs, chicken wings, bacon, sausage, bologna, salami, chitterlings, fatback, hot dogs, bratwurst, and packaged luncheon meats. Salted nuts and seeds. Canned beans with salt. Dairy Whole or 2% milk, cream, half-and-half, and cream cheese. Whole-fat or sweetened yogurt. Full-fat cheeses or blue cheese. Nondairy creamers and whipped toppings. Processed cheese, cheese spreads, or cheese curds. Condiments Onion and garlic salt, seasoned salt, table salt, and sea salt. Canned and packaged gravies. Worcestershire sauce. Tartar sauce. Barbecue sauce. Teriyaki sauce. Soy sauce, including reduced sodium. Steak sauce. Fish sauce. Oyster sauce. Cocktail sauce. Horseradish. Ketchup and mustard. Meat flavorings and  tenderizers. Bouillon cubes. Hot sauce. Tabasco sauce. Marinades. Taco seasonings. Relishes. Fats and Oils Butter, stick margarine, lard, shortening, ghee, and bacon fat. Coconut, palm kernel, or palm oils. Regular salad dressings. Other Pickles and olives. Salted popcorn and pretzels. The items listed above may not be a complete list of foods and beverages to avoid. Contact your dietitian for more information. WHERE CAN I FIND MORE INFORMATION? National Heart, Lung, and Blood Institute: CablePromo.itwww.nhlbi.nih.gov/health/health-topics/topics/dash/   This information is not intended to replace advice given to you by your health care provider. Make sure you discuss any questions you have with your health care provider.   Document Released: 05/22/2011 Document Revised: 06/23/2014 Document Reviewed: 04/06/2013 Elsevier Interactive Patient Education [employer]2016 Elsevier Inc.

## 2016-03-17 NOTE — Assessment & Plan Note (Signed)
Well-controlled today; DASH guidelines encouraged

## 2016-03-17 NOTE — Assessment & Plan Note (Signed)
Managed by urologist 

## 2016-03-17 NOTE — Progress Notes (Signed)
BP 114/62   Pulse 74   Temp 99 F (37.2 C) (Oral)   Resp 14   Wt 251 lb (113.9 kg)   SpO2 95%   BMI 36.01 kg/m    Subjective:    Patient ID: Jeffrey Rivers, male    DOB: October 04, 1970, 45 y.o.   MRN: [id number]  HPI: Jeffrey Rivers is a 45 y.o. male  Chief Complaint  Patient presents with  . Follow-up   He had HTN and LVH, saw cardiologist, did not have the stress test though (financial issues, I believe) He has been running and exercising, never had any problems  Tested for sleep apnea, one year Trying to lose weight Went to see urologist about BPH, went fine Tired, energy level not as good as it should be; around lunch time it goes down TSH normal in May Lab Results  Component Value Date   TSH 1.190 11/05/2015   Getting 5-6 hours of sleep Just a little body ache from maybe lifting something heavy; no runny nose; used some new muscles High cholesterol; LDL 146; bacon is the problem; 2-3 x a week eats red meat Sweets make him tired; chocolate makes him drained  [facility] 03/17/2016 11/29/2015 11/05/2015  Decreased Interest 0 0 0  Down, Depressed, Hopeless 0 0 0  PHQ - 2 Score 0 0 0   Relevant past medical, surgical, family and social history reviewed Past Medical History:  Diagnosis Date  . Essential hypertension, benign 11/05/2015  . Hypertension    Past Surgical History:  Procedure Laterality Date  . APPENDECTOMY    . HERNIA REPAIR Right 06/05/14   Family History  Problem Relation Age of Onset  . Cancer Father 2960    colon  . Prostate cancer Neg Hx   . Kidney cancer Neg Hx    Social History  Substance Use Topics  . Smoking status: Never Smoker  . Smokeless tobacco: Never Used  . Alcohol use No    Interim medical history since last visit reviewed. Allergies and medications reviewed  Review of Systems Per HPI unless specifically indicated above     Objective:    BP 114/62   Pulse 74   Temp 99 F (37.2 C) (Oral)   Resp 14    Wt 251 lb (113.9 kg)   SpO2 95%   BMI 36.01 kg/m   Wt Readings from Last 3 Encounters:  03/17/16 251 lb (113.9 kg)  01/14/16 253 lb (114.8 kg)  12/19/15 250 lb (113.4 kg)    Physical Exam  Constitutional: He appears well-developed and well-nourished. No distress.  obese  Cardiovascular: Normal rate and regular rhythm.   Pulmonary/Chest: Effort normal and breath sounds normal.  Abdominal: He exhibits no distension.  Musculoskeletal: He exhibits no edema.  Psychiatric: He has a normal mood and affect.  Vitals reviewed.     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     Follow up plan: Return in about 6 months (around 09/15/2016).  An after-visit summary was printed and given to the patient at check-out.  Please see the patient instructions which may contain other information and recommendations beyond what is mentioned above in the assessment and plan.  Orders Placed This Encounter  Procedures  . Testosterone Total,Free,Bio, Males  . Vitamin B12  . VITAMIN D 25  Hydroxy (Vit-D Deficiency, Fractures)  . COMPLETE METABOLIC PANEL WITH GFR  . Lipid Panel w/Direct LDL:HDL Ratio

## 2016-03-22 DIAGNOSIS — E66812 Obesity, class 2: Secondary | ICD-10-CM | POA: Insufficient documentation

## 2016-03-22 DIAGNOSIS — Z6837 Body mass index (BMI) 37.0-37.9, adult: Secondary | ICD-10-CM | POA: Insufficient documentation

## 2016-03-22 DIAGNOSIS — E669 Obesity, unspecified: Secondary | ICD-10-CM | POA: Insufficient documentation

## 2016-03-22 NOTE — Assessment & Plan Note (Signed)
Encouraged weight loss 

## 2016-04-21 ENCOUNTER — Ambulatory Visit: Payer: 59 | Admitting: Urology

## 2016-05-17 ENCOUNTER — Telehealth: Payer: Self-pay | Admitting: Family Medicine

## 2016-05-17 NOTE — Telephone Encounter (Signed)
Please follow-up with the patient about the labs that were ordered in October; please ask him to have those done in the next week or two; early morning, fasting; thank you

## 2016-05-19 NOTE — Telephone Encounter (Signed)
Called Patient regarding his labs. Labs were order in October. Patient understood that he need his labs done. Patient will come in and get them done.

## 2016-08-15 ENCOUNTER — Telehealth: Payer: Self-pay

## 2016-08-15 NOTE — Telephone Encounter (Signed)
Spoke with patient about his over due labs. Pt stated he can come in next week to get the labs done. Mention to pt that she will need lipid panel therefore he has to come in fasting. Advise pt to come in the morning; pt understood.

## 2016-12-22 ENCOUNTER — Ambulatory Visit (INDEPENDENT_AMBULATORY_CARE_PROVIDER_SITE_OTHER): Payer: BLUE CROSS/BLUE SHIELD | Admitting: Family Medicine

## 2016-12-22 ENCOUNTER — Encounter: Payer: Self-pay | Admitting: Family Medicine

## 2016-12-22 ENCOUNTER — Ambulatory Visit
Admission: RE | Admit: 2016-12-22 | Discharge: 2016-12-22 | Disposition: A | Discharge status: Home / Self Care | Payer: BLUE CROSS/BLUE SHIELD | Source: Ambulatory Visit | Attending: Family Medicine | Admitting: Family Medicine

## 2016-12-22 VITALS — BP 128/88 | HR 80 | Temp 98.4°F | Resp 18 | Ht 70.0 in | Wt 253.1 lb

## 2016-12-22 DIAGNOSIS — R52 Pain, unspecified: Secondary | ICD-10-CM | POA: Diagnosis not present

## 2016-12-22 DIAGNOSIS — R0602 Shortness of breath: Secondary | ICD-10-CM

## 2016-12-22 DIAGNOSIS — J029 Acute pharyngitis, unspecified: Secondary | ICD-10-CM

## 2016-12-22 LAB — POCT RAPID STREP A (OFFICE): Rapid Strep A Screen: NEGATIVE

## 2016-12-22 MED ORDER — MAGIC MOUTHWASH W/LIDOCAINE: 5.0000 mL | Freq: Three times a day (TID) | ORAL | 0 refills | Status: DC | PRN

## 2016-12-22 MED ORDER — AMOXICILLIN-POT CLAVULANATE 875-125 MG PO TABS: 1.0000 | ORAL_TABLET | Freq: Two times a day (BID) | ORAL | 0 refills | Status: AC

## 2016-12-22 NOTE — Progress Notes (Addendum)
Name: Jeffrey Rivers   MRN: [id number]    DOB: [birth year]   Date:12/22/2016       Progress Note  Subjective  Chief Complaint  Chief Complaint  Patient presents with  . Sore Throat    chills, sweats, bodyaches, lymph nodes feels swollen 4 days    HPI  Pt presents with 4 day history of body aches, swollen lymph nodes in neck, fevers/chills, myalgias, sore throat, shortness of breath with walking, decreased appetite and food not tasting right, and significant fatigue, some loose stools. No cough, nasal congestion, chest pain, ear pain/pressure, sinus pain/pressure, nausea or vomiting, hematochezia/melena or abdominal pain. No recent tick/insect bites.  Has been taking 600mg  Ibuprofen twice daily and this helps, but when it wears off he feels terrible again. Throat Pain is 4-5/10 now, it goes up to 8/10 without ibuprofen.  Patient Active Problem List   Diagnosis Date Noted  . Obesity 03/22/2016  . Fatigue 03/17/2016  . Hyperlipidemia LDL goal <100 03/17/2016  . BP (high blood pressure) 12/19/2015  . Hesitancy 12/19/2015  . Benign prostatic hyperplasia with urinary obstruction 11/29/2015  . Essential hypertension, benign 11/05/2015  . LVH (left ventricular hypertrophy) 11/05/2015  . Medication monitoring encounter 11/05/2015  . Prostate cancer screening 11/05/2015  . Tachycardia 11/05/2015    Social History  Substance Use Topics  . Smoking status: Never Smoker  . Smokeless tobacco: Never Used  . Alcohol use No    Current Outpatient Prescriptions:  .  alfuzosin (UROXATRAL) 10 MG 24 hr tablet, Take 1 tablet (10 mg total) by mouth daily with breakfast., Disp: 30 tablet, Rfl: 11  No Known Allergies  ROS  Ten systems reviewed and is negative except as mentioned in HPI  Objective  Vitals:   12/22/16 0844  BP: 128/88  Pulse: 80  Resp: 18  Temp: 98.4 F (36.9 C)  TempSrc: Oral  SpO2: 98%  Weight: 253 lb 1.6 oz (114.8 kg)  Height: 5\' 10"  (1.778 m)   Body mass index is  36.32 kg/m.  Nursing Note and Vital Signs reviewed.  Physical Exam Constitutional: Patient appears well-developed and well-nourished. Obese No distress.  HEENT: head atraumatic, normocephalic, pupils equal and reactive to light, EOM's intact, TM's without erythema or bulging, no maxillary or frontal sinus pain on palpation, neck supple with bilateral lymphadenopathy to submandibular nodes, oropharynx erythematous and moist without exudate, tonsils +1. Cardiovascular: Normal rate, regular rhythm, S1/S2 present.  No murmur or rub heard. No BLE edema. Pulmonary/Chest: Effort normal and breath sounds clear. No respiratory distress or retractions. Psychiatric: Patient has a normal mood and affect. behavior is normal. Judgment and thought content normal.  Recent Results (from the past 2160 hour(s))  POCT rapid strep A     Status: None   Collection Time: 12/22/16  8:47 AM  Result Value Ref Range   Rapid Strep A Screen Negative Negative     Assessment & Plan  1. Pharyngitis, unspecified etiology - magic mouthwash w/lidocaine SOLN; Take 5 mLs by mouth 3 (three) times daily as needed for mouth pain.  Dispense: 50 mL; Refill: 0 - amoxicillin-clavulanate (AUGMENTIN) 875-125 MG tablet; Take 1 tablet by mouth 2 (two) times daily.  Dispense: 20 tablet; Refill: 0  2. Sore throat - POCT rapid strep A - magic mouthwash w/lidocaine SOLN; Take 5 mLs by mouth 3 (three) times daily as needed for mouth pain.  Dispense: 50 mL; Refill: 0  3. Body aches Tylenol or Advil PRN for fevers and body aches.  4.  Shortness of breath on exertion - DG Chest 2 View; Future - Negative for PNA  - Work note for today and tomorrow provided.  -Red flags and when to present for emergency care or RTC including fever >101.67F, chest pain, shortness of breath, new/worsening/un-resolving symptoms, throat swelling/tightness, significant malaise/fatigue reviewed with patient at time of visit. Follow up and care instructions  discussed and provided in AVS.  I have reviewed this encounter including the documentation in this note and/or discussed this patient with the Deboraha Sprangprovider,Emily Boyce, FNP, NP-C. I am certifying that I agree with the content of this note as supervising physician.  Alba CoryKrichna Sowles, MD [facility] 12/22/2016, 1:22 PM

## 2016-12-22 NOTE — Patient Instructions (Addendum)
-   Please use your Albuterol inhaler as needed for shortness of breath. - Continue Ibuprofen for pain and/or fever - 600mg  every 8 hours. - Please use a cool mist humidifier at night. - Take probiotic or eat probiotic yogurt daily while taking your antibiotic and for 7 days afterwards.  Pharyngitis Pharyngitis is a sore throat (pharynx). There is redness, pain, and swelling of your throat. Follow these instructions at home:  Drink enough fluids to keep your pee (urine) clear or pale yellow.  Only take medicine as told by your doctor. ? You may get sick again if you do not take medicine as told. Finish your medicines, even if you start to feel better. ? Do not take aspirin.  Rest.  Rinse your mouth (gargle) with salt water ( tsp of salt per 1 qt of water) every 1-2 hours. This will help the pain.  If you are not at risk for choking, you can suck on hard candy or sore throat lozenges. Contact a doctor if:  You have large, tender lumps on your neck.  You have a rash.  You cough up green, yellow-brown, or bloody spit. Get help right away if:  You have a stiff neck.  You drool or cannot swallow liquids.  You throw up (vomit) or are not able to keep medicine or liquids down.  You have very bad pain that does not go away with medicine.  You have problems breathing (not from a stuffy nose). This information is not intended to replace advice given to you by your health care provider. Make sure you discuss any questions you have with your health care provider. Document Released: 11/19/2007 Document Revised: 11/08/2015 Document Reviewed: 02/07/2013 Elsevier Interactive Patient Education  2017 [employer]

## 2016-12-24 IMAGING — CR DG CHEST 2V
2 series · 2 of 2 positions shown · non-contrast
Comparison: 06/23/2014

CLINICAL DATA: Pt reports right shoulder pain x2 days and dizziness
today; sent here from [HOSPITAL]. Pt has hx of HTN. Never a smoker.
Denies heart/lung hx. shielded

EXAM:
CHEST  2 VIEW

[chest pa]
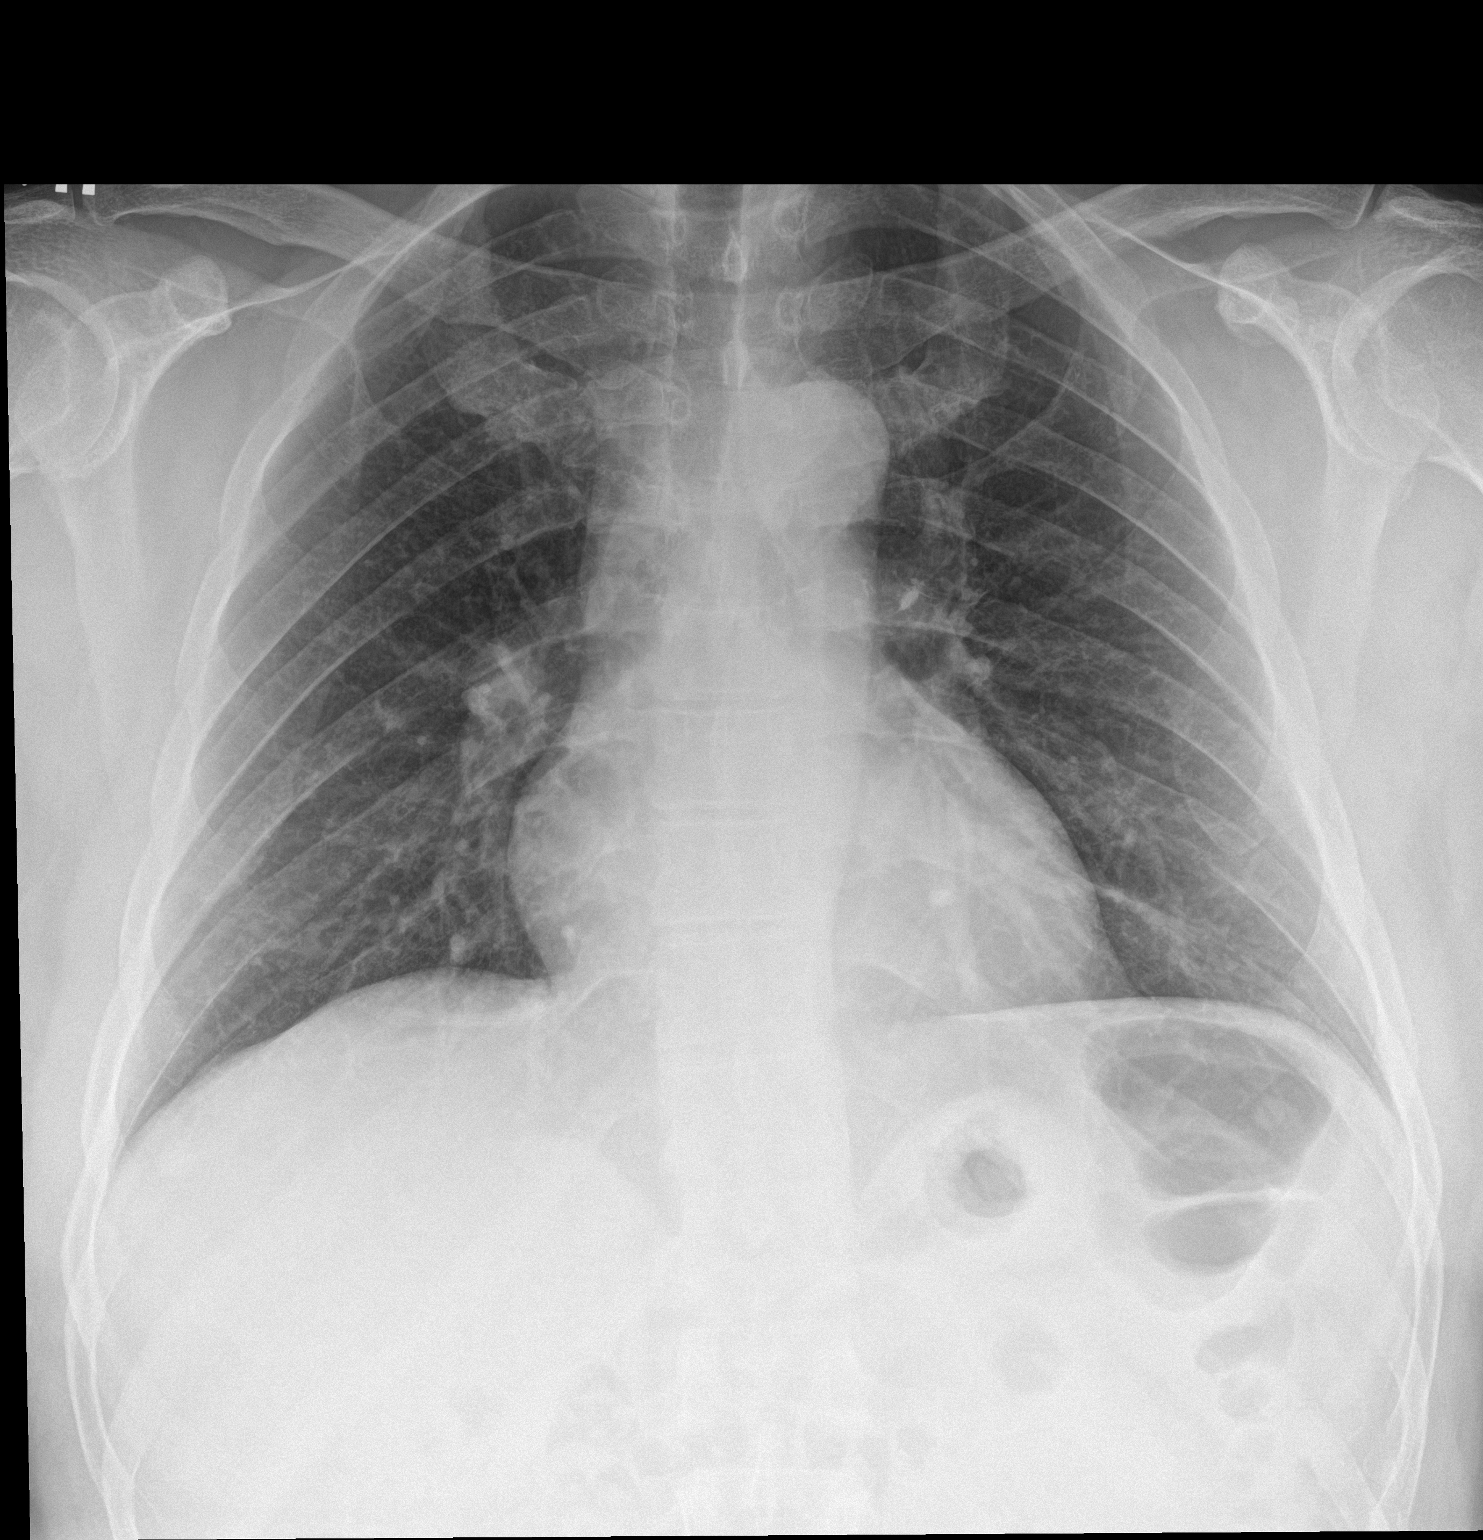

[chest lat]
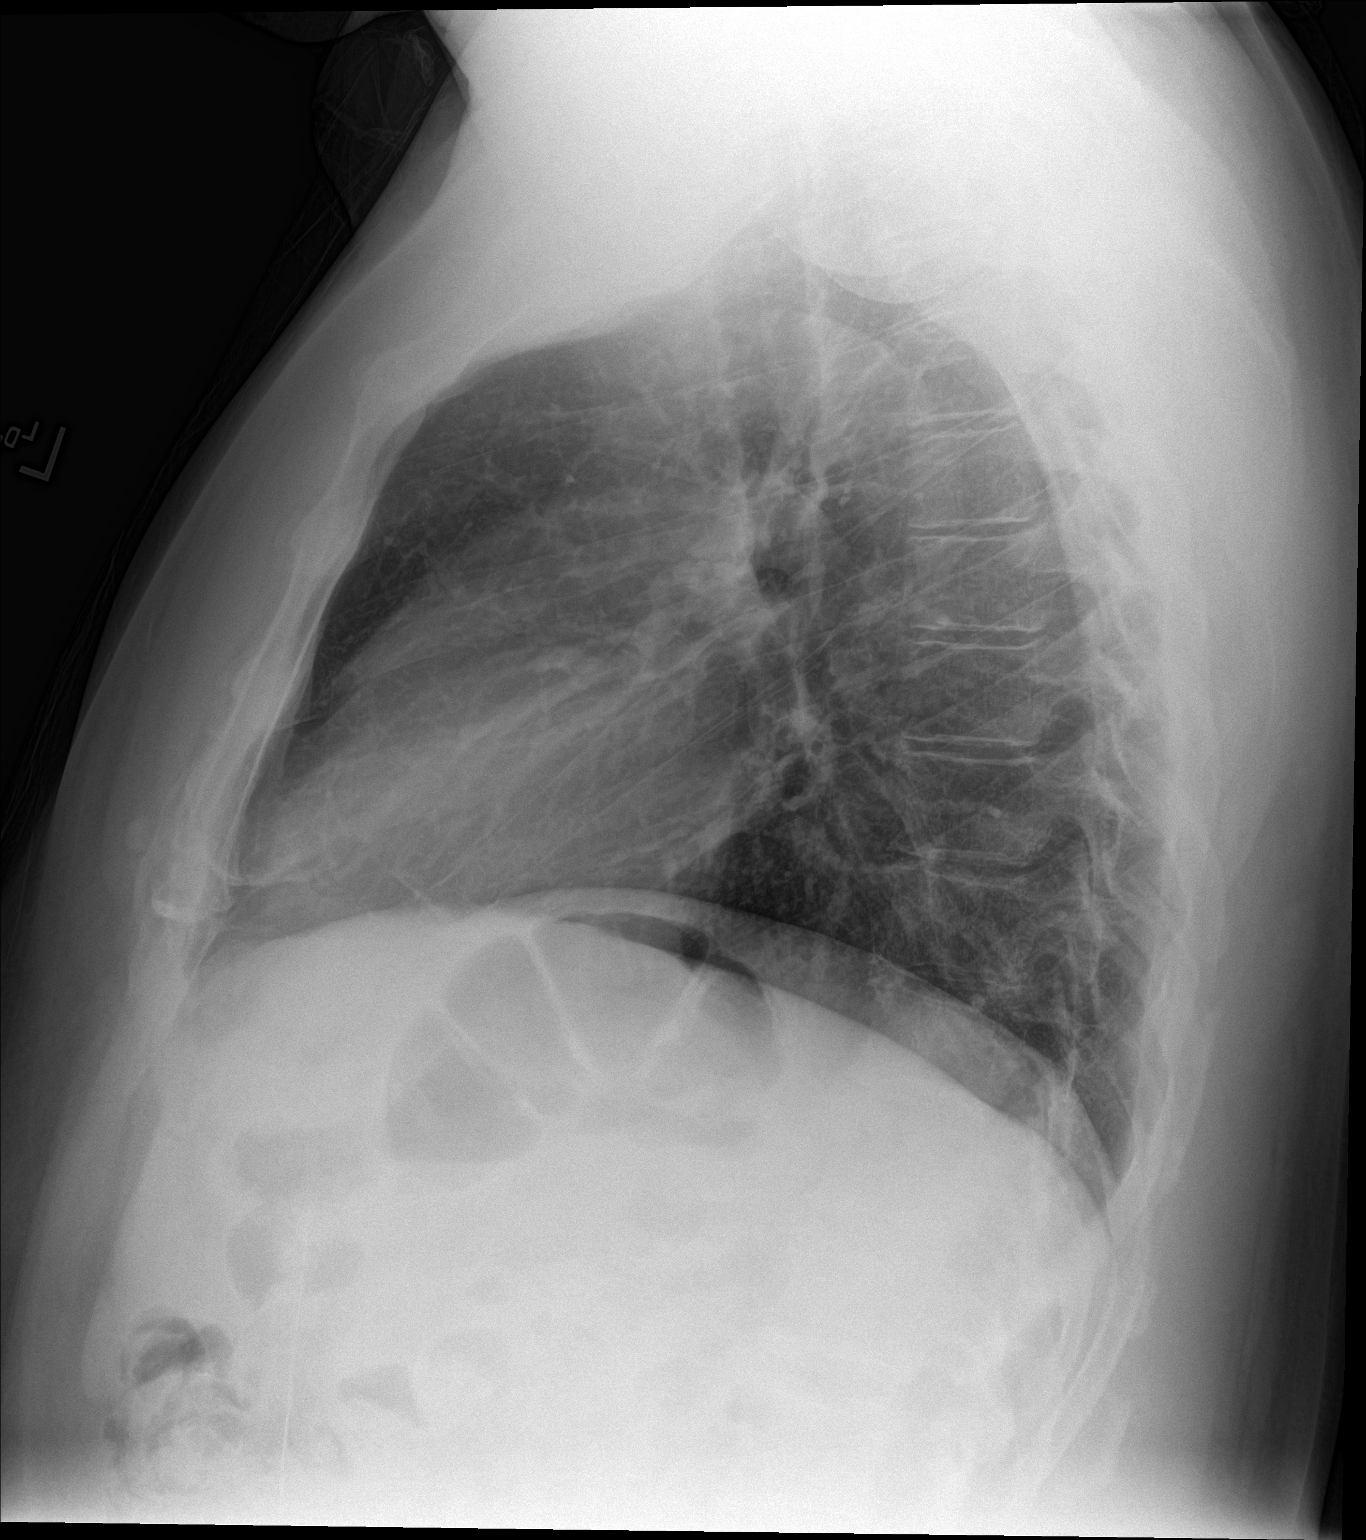

[2 of 2 positions shown; findings below may reference images not displayed]

FINDINGS: The heart size and mediastinal contours are within normal limits.
Both lungs are clear. The visualized skeletal structures are
unremarkable.
IMPRESSION: No active cardiopulmonary disease.

## 2017-01-30 ENCOUNTER — Ambulatory Visit: Payer: BLUE CROSS/BLUE SHIELD | Admitting: Family Medicine

## 2017-02-02 ENCOUNTER — Encounter: Payer: Self-pay | Admitting: Family Medicine

## 2017-02-02 ENCOUNTER — Other Ambulatory Visit: Payer: Self-pay | Admitting: Family Medicine

## 2017-02-02 ENCOUNTER — Ambulatory Visit (INDEPENDENT_AMBULATORY_CARE_PROVIDER_SITE_OTHER): Payer: BLUE CROSS/BLUE SHIELD | Admitting: Family Medicine

## 2017-02-02 VITALS — BP 128/88 | HR 68 | Temp 97.6°F | Resp 14 | Ht 70.5 in | Wt 250.6 lb

## 2017-02-02 DIAGNOSIS — Z114 Encounter for screening for human immunodeficiency virus [HIV]: Secondary | ICD-10-CM

## 2017-02-02 DIAGNOSIS — E785 Hyperlipidemia, unspecified: Secondary | ICD-10-CM

## 2017-02-02 DIAGNOSIS — Z6835 Body mass index (BMI) 35.0-35.9, adult: Secondary | ICD-10-CM

## 2017-02-02 DIAGNOSIS — I10 Essential (primary) hypertension: Secondary | ICD-10-CM | POA: Diagnosis not present

## 2017-02-02 DIAGNOSIS — E6609 Other obesity due to excess calories: Secondary | ICD-10-CM

## 2017-02-02 DIAGNOSIS — M25512 Pain in left shoulder: Secondary | ICD-10-CM

## 2017-02-02 DIAGNOSIS — N138 Other obstructive and reflux uropathy: Secondary | ICD-10-CM

## 2017-02-02 DIAGNOSIS — N401 Enlarged prostate with lower urinary tract symptoms: Secondary | ICD-10-CM | POA: Diagnosis not present

## 2017-02-02 NOTE — Progress Notes (Signed)
BP 128/88   Pulse 68   Temp 97.6 F (36.4 C) (Oral)   Resp 14   Ht 5' 10.5" (1.791 m)   Wt 250 lb 9.6 oz (113.7 kg)   SpO2 96%   BMI 35.45 kg/m    Subjective:    Patient ID: Jeffrey Rivers, male    DOB: [birth year], 46 y.o.   MRN: [id number]  HPI: Jeffrey Rivers is a 46 y.o. male  Chief Complaint  Patient presents with  . Arm Pain    left (possibly the way he was sleeping) since he scheduled has not had anymore  . blood work    HPI Patient is here for an acute visit His left arm has felt "funny" for a few days It happeneed last week; not bothering him now at all; nothing since He thinks it was how he slept on it; they bought a new mattress andw as sleeping on the side twiisted to the side Symptoms lasted about 2 days; symptoms were there when he woke up in the morning; when he turned a certain way, it would go way; sometimes feels like he has to stretch a muscle in his shoulder; no numbness or tingling down the arm Felt sore like he'd been hit by a hammer; in the shoulder joint; he could move a certain way and it would go away completely He took some ibuprofen and did push ups and it went away Nothing similar before No SHOB; no nausea; no chest pressure  Hypertension; has a maqchine at home, but not checking at home; no headaches or vision probloems High cholesterol; came fasting; last food was peanuts at 9 pm last night; not much cheese; about six eggs a week Trying to be more active, never gets chest pain when he works out, trying to lose weight; it's tough; definitifely cutting out pork and red meat; loves sweets He still has a problem with his prostate; ran out of the medicine; medicine helped  Depression screen [facility] 2/9 02/02/2017 03/17/2016 11/29/2015 11/05/2015  Decreased Interest 0 0 0 0  Down, Depressed, Hopeless 0 0 0 0  PHQ - 2 Score 0 0 0 0    Relevant past medical, surgical, family and social history reviewed Past Medical History:  Diagnosis Date  .  Essential hypertension, benign 11/05/2015  . Hypertension    Past Surgical History:  Procedure Laterality Date  . APPENDECTOMY    . HERNIA REPAIR Right 06/05/14   Family History  Problem Relation Age of Onset  . Cancer Father 23       colon  . Prostate cancer Neg Hx   . Kidney cancer Neg Hx   father was about 77 or 77 years old Social History   Social History  . Marital status: Married    Spouse name: N/A  . Number of children: N/A  . Years of education: N/A   Occupational History  . Not on file.   Social History Main Topics  . Smoking status: Never Smoker  . Smokeless tobacco: Never Used  . Alcohol use No  . Drug use: No  . Sexual activity: Not Currently   Other Topics Concern  . Not on file   Social History Narrative  . No narrative on file    Interim medical history since last visit reviewed. Allergies and medications reviewed  Review of Systems Per HPI unless specifically indicated above     Objective:    BP 128/88   Pulse 68   Temp  97.6 F (36.4 C) (Oral)   Resp 14   Ht 5' 10.5" (1.791 m)   Wt 250 lb 9.6 oz (113.7 kg)   SpO2 96%   BMI 35.45 kg/m   Wt Readings from Last 3 Encounters:  02/02/17 250 lb 9.6 oz (113.7 kg)  12/22/16 253 lb 1.6 oz (114.8 kg)  03/17/16 251 lb (113.9 kg)    Physical Exam  Constitutional: He appears well-developed and well-nourished. No distress.  obese  Cardiovascular: Normal rate and regular rhythm.   Pulmonary/Chest: Effort normal and breath sounds normal.  Abdominal: He exhibits no distension.  Musculoskeletal: He exhibits no edema.       Left shoulder: He exhibits normal range of motion, no tenderness, no bony tenderness, no swelling, no effusion, no crepitus, no deformity and normal strength.  Neurological:  LE strength 5/5  Psychiatric: He has a normal mood and affect. His mood appears not anxious. He does not exhibit a depressed mood.  Vitals reviewed.   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)    Order PSA and start back on medicine      Relevant Orders   PSA     Other   Obesity (Chronic)    Work on weight loss; next goal is BMI less than 30; calculated weight, see AVS      Hyperlipidemia LDL goal <100 (Chronic)    Check lipids today; limit saturated fats and get more grains and fiber      Relevant Orders   COMPLETE METABOLIC PANEL WITH GFR   Lipid panel    Other Visit Diagnoses    Screening for HIV (human immunodeficiency virus)       Relevant Orders   HIV antibody (with reflex)   Acute pain of left shoulder       completely resolved; sounds musculoskeletal, sx changed with movement; no CP, no SHOB, no nausea; call me if recurs       Follow up plan: Return in about 6 months (around 08/05/2017) for twenty minute follow-up with fasting labs.  An after-visit summary was printed and given to the patient at check-out.  Please see the patient instructions which may contain other information and recommendations beyond what is mentioned above in the assessment and plan.  No orders of the defined types were placed in this encounter.   Orders Placed This Encounter  Procedures  . HIV antibody (with reflex)  . PSA  . COMPLETE METABOLIC PANEL WITH GFR  . Lipid panel

## 2017-02-02 NOTE — Patient Instructions (Addendum)
Our next weight goal for you is 211 pounds or less; that will get your weight out of the obesity category Let me know if your symptoms return We'll get labs today You will be due for a colonoscopy in May 2019; please call us then so we can put in that referral Check out the information at [email or web address] entitled "Nutrition for Weight Loss: What You Need to Know about Fad Diets" Try to lose between 1-2 pounds per week by taking in fewer calories and burning off more calories You can succeed by limiting portions, limiting foods dense in calories and fat, becoming more active, and drinking 8 glasses of water a day (64 ounces) Don't skip meals, especially breakfast, as skipping meals may alter your metabolism Do not use over-the-counter weight loss pills or gimmicks that claim rapid weight loss A healthy BMI (or body mass index) is between 18.5 and 24.9 You can calculate your ideal BMI at the NIH website JobEconomics.hu   DASH Eating Plan DASH stands for "Dietary Approaches to Stop Hypertension." The DASH eating plan is a healthy eating plan that has been shown to reduce high blood pressure (hypertension). It may also reduce your risk for type 2 diabetes, heart disease, and stroke. The DASH eating plan may also help with weight loss. What are tips for following this plan? General guidelines  Avoid eating more than 2,300 mg (milligrams) of salt (sodium) a day. If you have hypertension, you may need to reduce your sodium intake to 1,500 mg a day.  Limit alcohol intake to no more than 1 drink a day for nonpregnant women and 2 drinks a day for men. One drink equals 12 oz of beer, 5 oz of wine, or 1 oz of hard liquor.  Work with your health care provider to maintain a healthy body weight or to lose weight. Ask what an ideal weight is for you.  Get at least 30 minutes of exercise that causes your heart to beat faster (aerobic exercise) most days of  the week. Activities may include walking, swimming, or biking.  Work with your health care provider or diet and nutrition specialist (dietitian) to adjust your eating plan to your individual calorie needs. Reading food labels  Check food labels for the amount of sodium per serving. Choose foods with less than 5 percent of the Daily Value of sodium. Generally, foods with less than 300 mg of sodium per serving fit into this eating plan.  To find whole grains, look for the word "whole" as the first word in the ingredient list. Shopping  Buy products labeled as "low-sodium" or "no salt added."  Buy fresh foods. Avoid canned foods and premade or frozen meals. Cooking  Avoid adding salt when cooking. Use salt-free seasonings or herbs instead of table salt or sea salt. Check with your health care provider or pharmacist before using salt substitutes.  Do not fry foods. Cook foods using healthy methods such as baking, boiling, grilling, and broiling instead.  Cook with heart-healthy oils, such as olive, canola, soybean, or sunflower oil. Meal planning   Eat a balanced diet that includes: ? 5 or more servings of fruits and vegetables each day. At each meal, try to fill half of your plate with fruits and vegetables. ? Up to 6-8 servings of whole grains each day. ? Less than 6 oz of lean meat, poultry, or fish each day. A 3-oz serving of meat is about the same size as a deck of cards. One egg equals 1  oz. ? 2 servings of low-fat dairy each day. ? A serving of nuts, seeds, or beans 5 times each week. ? Heart-healthy fats. Healthy fats called Omega-3 fatty acids are found in foods such as flaxseeds and coldwater fish, like sardines, salmon, and mackerel.  Limit how much you eat of the following: ? Canned or prepackaged foods. ? Food that is high in trans fat, such as fried foods. ? Food that is high in saturated fat, such as fatty meat. ? Sweets, desserts, sugary drinks, and other foods with  added sugar. ? Full-fat dairy products.  Do not salt foods before eating.  Try to eat at least 2 vegetarian meals each week.  Eat more home-cooked food and less restaurant, buffet, and fast food.  When eating at a restaurant, ask that your food be prepared with less salt or no salt, if possible. What foods are recommended? The items listed may not be a complete list. Talk with your dietitian about what dietary choices are best for you. Grains Whole-grain or whole-wheat bread. Whole-grain or whole-wheat pasta. Brown rice. Orpah Cobb. Bulgur. Whole-grain and low-sodium cereals. Pita bread. Low-fat, low-sodium crackers. Whole-wheat flour tortillas. Vegetables Fresh or frozen vegetables (raw, steamed, roasted, or grilled). Low-sodium or reduced-sodium tomato and vegetable juice. Low-sodium or reduced-sodium tomato sauce and tomato paste. Low-sodium or reduced-sodium canned vegetables. Fruits All fresh, dried, or frozen fruit. Canned fruit in natural juice (without added sugar). Meat and other protein foods Skinless chicken or [place]. Ground chicken or [place]. Pork with fat trimmed off. Fish and seafood. Egg whites. Dried beans, peas, or lentils. Unsalted nuts, nut butters, and seeds. Unsalted canned beans. Lean cuts of beef with fat trimmed off. Low-sodium, lean deli meat. Dairy Low-fat (1%) or fat-free (skim) milk. Fat-free, low-fat, or reduced-fat cheeses. Nonfat, low-sodium ricotta or cottage cheese. Low-fat or nonfat yogurt. Low-fat, low-sodium cheese. Fats and oils Soft margarine without trans fats. Vegetable oil. Low-fat, reduced-fat, or light mayonnaise and salad dressings (reduced-sodium). Canola, safflower, olive, soybean, and sunflower oils. Avocado. Seasoning and other foods Herbs. Spices. Seasoning mixes without salt. Unsalted popcorn and pretzels. Fat-free sweets. What foods are not recommended? The items listed may not be a complete list. Talk with your dietitian about what  dietary choices are best for you. Grains Baked goods made with fat, such as croissants, muffins, or some breads. Dry pasta or rice meal packs. Vegetables Creamed or fried vegetables. Vegetables in a cheese sauce. Regular canned vegetables (not low-sodium or reduced-sodium). Regular canned tomato sauce and paste (not low-sodium or reduced-sodium). Regular tomato and vegetable juice (not low-sodium or reduced-sodium). Rosita Fire. Olives. Fruits Canned fruit in a light or heavy syrup. Fried fruit. Fruit in cream or butter sauce. Meat and other protein foods Fatty cuts of meat. Ribs. Fried meat. Tomasa Blase. Sausage. Bologna and other processed lunch meats. Salami. Fatback. Hotdogs. Bratwurst. Salted nuts and seeds. Canned beans with added salt. Canned or smoked fish. Whole eggs or egg yolks. Chicken or [place] with skin. Dairy Whole or 2% milk, cream, and half-and-half. Whole or full-fat cream cheese. Whole-fat or sweetened yogurt. Full-fat cheese. Nondairy creamers. Whipped toppings. Processed cheese and cheese spreads. Fats and oils Butter. Stick margarine. Lard. Shortening. Ghee. Bacon fat. Tropical oils, such as coconut, palm kernel, or palm oil. Seasoning and other foods Salted popcorn and pretzels. Onion salt, garlic salt, seasoned salt, table salt, and sea salt. Worcestershire sauce. Tartar sauce. Barbecue sauce. Teriyaki sauce. Soy sauce, including reduced-sodium. Steak sauce. Canned and packaged gravies. Fish sauce. Oyster sauce.  Cocktail sauce. Horseradish that you find on the shelf. Ketchup. Mustard. Meat flavorings and tenderizers. Bouillon cubes. Hot sauce and Tabasco sauce. Premade or packaged marinades. Premade or packaged taco seasonings. Relishes. Regular salad dressings. Where to find more information:  National Heart, Lung, and Blood Institute: PopSteam.is  American Heart Association: www.heart.org Summary  The DASH eating plan is a healthy eating plan that has been shown to reduce  high blood pressure (hypertension). It may also reduce your risk for type 2 diabetes, heart disease, and stroke.  With the DASH eating plan, you should limit salt (sodium) intake to 2,300 mg a day. If you have hypertension, you may need to reduce your sodium intake to 1,500 mg a day.  When on the DASH eating plan, aim to eat more fresh fruits and vegetables, whole grains, lean proteins, low-fat dairy, and heart-healthy fats.  Work with your health care provider or diet and nutrition specialist (dietitian) to adjust your eating plan to your individual calorie needs. This information is not intended to replace advice given to you by your health care provider. Make sure you discuss any questions you have with your health care provider. Document Released: 05/22/2011 Document Revised: 05/26/2016 Document Reviewed: 05/26/2016 Elsevier Interactive Patient Education  2017 [employer].

## 2017-02-02 NOTE — Telephone Encounter (Signed)
Informed pt and also gave him the number to his urologist office.

## 2017-02-02 NOTE — Telephone Encounter (Signed)
He should contact his urologist please; thank you

## 2017-02-02 NOTE — Assessment & Plan Note (Signed)
Order PSA and start back on medicine

## 2017-02-02 NOTE — Assessment & Plan Note (Addendum)
Work on weight loss; next goal is BMI less than 30; calculated weight, see AVS

## 2017-02-02 NOTE — Assessment & Plan Note (Signed)
Advised to try DASH guidelines, lose weight

## 2017-02-02 NOTE — Assessment & Plan Note (Signed)
Check lipids today; limit saturated fats and get more grains and fiber

## 2017-02-03 ENCOUNTER — Other Ambulatory Visit: Payer: Self-pay

## 2017-02-03 DIAGNOSIS — N138 Other obstructive and reflux uropathy: Secondary | ICD-10-CM

## 2017-02-03 DIAGNOSIS — N401 Enlarged prostate with lower urinary tract symptoms: Principal | ICD-10-CM

## 2017-02-03 LAB — COMPLETE METABOLIC PANEL WITH GFR
ALT: 23 U/L (ref 9–46)
AST: 23 U/L (ref 10–40)
Albumin: 4.3 g/dL (ref 3.6–5.1)
Alkaline Phosphatase: 43 U/L (ref 40–115)
BUN: 14 mg/dL (ref 7–25)
CO2: 20 mmol/L (ref 20–32)
Calcium: 9.2 mg/dL (ref 8.6–10.3)
Chloride: 105 mmol/L (ref 98–110)
Creat: 0.98 mg/dL (ref 0.60–1.35)
GFR, Est African American: >89 mL/min (ref >=60)
GFR, Est Non African American: >89 mL/min (ref >=60)
Glucose, Bld: 101 mg/dL — ABNORMAL HIGH (ref 65–99)
Potassium: 4.3 mmol/L (ref 3.5–5.3)
Sodium: 141 mmol/L (ref 135–146)
Total Bilirubin: 0.5 mg/dL (ref 0.2–1.2)
Total Protein: 6.5 g/dL (ref 6.1–8.1)

## 2017-02-03 LAB — PSA: PSA: 0.4 ng/mL (ref <=4.0)

## 2017-02-03 LAB — LIPID PANEL
Cholesterol: 177 mg/dL (ref <200)
HDL: 53 mg/dL (ref >40)
LDL Cholesterol: 113 mg/dL — ABNORMAL HIGH (ref <100)
Total CHOL/HDL Ratio: 3.3 Ratio (ref <5.0)
Triglycerides: 56 mg/dL (ref <150)
VLDL: 11 mg/dL (ref <30)

## 2017-02-03 LAB — HIV ANTIBODY (ROUTINE TESTING W REFLEX): HIV 1&2 Ab, 4th Generation: NONREACTIVE

## 2017-02-03 MED ORDER — TAMSULOSIN HCL 0.4 MG PO CAPS: 0.4000 mg | ORAL_CAPSULE | Freq: Every day | ORAL | 3 refills | Status: DC

## 2017-08-05 ENCOUNTER — Ambulatory Visit: Payer: BLUE CROSS/BLUE SHIELD | Admitting: Family Medicine

## 2017-08-21 ENCOUNTER — Encounter: Payer: Self-pay | Admitting: Family Medicine

## 2017-08-21 ENCOUNTER — Ambulatory Visit: Payer: BLUE CROSS/BLUE SHIELD | Admitting: Family Medicine

## 2017-08-21 VITALS — BP 142/94 | HR 79 | Temp 97.4°F | Resp 14 | Wt 256.2 lb

## 2017-08-21 DIAGNOSIS — Z6836 Body mass index (BMI) 36.0-36.9, adult: Secondary | ICD-10-CM

## 2017-08-21 DIAGNOSIS — Z5181 Encounter for therapeutic drug level monitoring: Secondary | ICD-10-CM

## 2017-08-21 DIAGNOSIS — E785 Hyperlipidemia, unspecified: Secondary | ICD-10-CM | POA: Diagnosis not present

## 2017-08-21 DIAGNOSIS — E6609 Other obesity due to excess calories: Secondary | ICD-10-CM

## 2017-08-21 DIAGNOSIS — I517 Cardiomegaly: Secondary | ICD-10-CM

## 2017-08-21 DIAGNOSIS — I10 Essential (primary) hypertension: Secondary | ICD-10-CM

## 2017-08-21 DIAGNOSIS — R739 Hyperglycemia, unspecified: Secondary | ICD-10-CM

## 2017-08-21 MED ORDER — AMLODIPINE BESYLATE 5 MG PO TABS: 5.0000 mg | ORAL_TABLET | Freq: Every day | ORAL | 3 refills | Status: DC

## 2017-08-21 NOTE — Progress Notes (Signed)
BP (!) 142/94   Pulse 79   Temp (!) 97.4 F (36.3 C) (Oral)   Resp 14   Wt 256 lb 3.2 oz (116.2 kg)   SpO2 97%   BMI 36.24 kg/m    Subjective:    Patient ID: Jeffrey Rivers, male    DOB: 1971-01-15, 47 y.o.   MRN: [id number]  HPI: Jeffrey Rivers is a 47 y.o. male  Chief Complaint  Patient presents with  . Follow-up    HPI Patient is here for follow-up He has high blood pressure; uses salt shaker some; drinks lot of coffee; used decongestants in the last two weeks Obesity; hard to lose weight; tends to eat out a lot because of the schedule; drinks some water, maybe not enough; not many sodas at all Mildly elevated glucose on last check; no dry mouth Gets on the treadmill and runs 3x a week, tries to; no chest pain  Depression screen [place] [facility] 2/9 08/21/2017 02/02/2017 03/17/2016 11/29/2015 11/05/2015  Decreased Interest 0 0 0 0 0  Down, Depressed, Hopeless 0 0 0 0 0  PHQ - 2 Score 0 0 0 0 0    Relevant past medical, surgical, family and social history reviewed Past Medical History:  Diagnosis Date  . Essential hypertension, benign 11/05/2015  . Hypertension    Past Surgical History:  Procedure Laterality Date  . APPENDECTOMY    . HERNIA REPAIR Right 06/05/14   Family History  Problem Relation Age of Onset  . Cancer Father 6560       colon  . Prostate cancer Neg Hx   . Kidney cancer Neg Hx    Social History   Tobacco Use  . Smoking status: Never Smoker  . Smokeless tobacco: Never Used  Substance Use Topics  . Alcohol use: No  . Drug use: No    Interim medical history since last visit reviewed. Allergies and medications reviewed  Review of Systems Per HPI unless specifically indicated above     Objective:    BP (!) 142/94   Pulse 79   Temp (!) 97.4 F (36.3 C) (Oral)   Resp 14   Wt 256 lb 3.2 oz (116.2 kg)   SpO2 97%   BMI 36.24 kg/m   Wt Readings from Last 3 Encounters:  08/21/17 256 lb 3.2 oz (116.2 kg)  02/02/17 250 lb 9.6 oz (113.7 kg)    12/22/16 253 lb 1.6 oz (114.8 kg)    Physical Exam  Constitutional: He appears well-developed and well-nourished. No distress.  Obese  Eyes: No scleral icterus.  Cardiovascular: Normal rate and regular rhythm.  Pulmonary/Chest: Effort normal and breath sounds normal.  Abdominal: He exhibits no distension.  Neurological: He is alert.  Skin: No pallor.  Psychiatric: He has a normal mood and affect.    Results for orders placed or performed in visit on 02/02/17  HIV antibody (with reflex)  Result Value Ref Range   HIV 1&2 Ab, 4th Generation NONREACTIVE NONREACTIVE  PSA  Result Value Ref Range   PSA 0.4 <=4.0 ng/mL  COMPLETE METABOLIC PANEL WITH GFR  Result Value Ref Range   Sodium 141 135 - 146 mmol/L   Potassium 4.3 3.5 - 5.3 mmol/L   Chloride 105 98 - 110 mmol/L   CO2 20 20 - 32 mmol/L   Glucose, Bld 101 (H) 65 - 99 mg/dL   BUN 14 7 - 25 mg/dL   Creat 4.090.98 8.110.60 - 9.141.35 mg/dL   Total Bilirubin 0.5 0.2 -  1.2 mg/dL   Alkaline Phosphatase 43 40 - 115 U/L   AST 23 10 - 40 U/L   ALT 23 9 - 46 U/L   Total Protein 6.5 6.1 - 8.1 g/dL   Albumin 4.3 3.6 - 5.1 g/dL   Calcium 9.2 8.6 - 16.1 mg/dL   GFR, Est African American >89 >=60 mL/min   GFR, Est Non African American >89 >=60 mL/min  Lipid panel  Result Value Ref Range   Cholesterol 177 <200 mg/dL   Triglycerides 56 <096 mg/dL   HDL 53 >04 mg/dL   Total CHOL/HDL Ratio 3.3 <5.0 Ratio   VLDL 11 <30 mg/dL   LDL Cholesterol 540 (H) <100 mg/dL      Assessment & Plan:   Problem List Items Addressed This Visit      Cardiovascular and Mediastinum   LVH (left ventricular hypertrophy) (Chronic)    Weight loss and bringing down BP      Relevant Medications   amLODipine (NORVASC) 5 MG tablet   Essential hypertension, benign - Primary (Chronic)    Try DASH guidelines, weight loss encouraged; start low dose BP medicine; as weight comes down and BP comes down, hope to stop the pill      Relevant Medications   amLODipine  (NORVASC) 5 MG tablet     Other   Obesity (Chronic)    Encouragement given; open to meeting with nutritionist; see AVS      Medication monitoring encounter    Check creatinine      Hyperlipidemia LDL goal <100 (Chronic)    Encouraged diet lower in saturated fats; weight loss      Relevant Medications   amLODipine (NORVASC) 5 MG tablet   Other Relevant Orders   Lipid panel    Other Visit Diagnoses    Hyperglycemia       check fasting glucose and A1c in 2 weeks   Relevant Orders   Basic metabolic panel   Hemoglobin A1c       Follow up plan: Return in about 2 weeks (around 09/04/2017) for blood pressure recheck with CMA.  An after-visit summary was printed and given to the patient at check-out.  Please see the patient instructions which may contain other information and recommendations beyond what is mentioned above in the assessment and plan.  Meds ordered this encounter  Medications  . amLODipine (NORVASC) 5 MG tablet    Sig: Take 1 tablet (5 mg total) by mouth daily.    Dispense:  90 tablet    Refill:  3    Orders Placed This Encounter  Procedures  . Basic metabolic panel  . Hemoglobin A1c  . Lipid panel

## 2017-08-21 NOTE — Assessment & Plan Note (Signed)
Weight loss and bringing down BP

## 2017-08-21 NOTE — Assessment & Plan Note (Signed)
Check creatinine 

## 2017-08-21 NOTE — Assessment & Plan Note (Signed)
Encouragement given; open to meeting with nutritionist; see AVS

## 2017-08-21 NOTE — Patient Instructions (Addendum)
Try to use PLAIN allergy medicine without the decongestant Avoid: phenylephrine, phenylpropanolamine, and pseudoephredine Try to follow the DASH guidelines (DASH stands for Dietary Approaches to Stop Hypertension). Try to limit the sodium in your diet to no more than 1,500mg  of sodium per day. Certainly try to not exceed 2,000 mg per day at the very most. Do not add salt when cooking or at the table.  Check the sodium amount on labels when shopping, and choose items lower in sodium when given a choice. Avoid or limit foods that already contain a lot of sodium. Eat a diet rich in fruits and vegetables and whole grains, and try to lose weight if overweight or obese Check out the information at familydoctor.org entitled "Nutrition for Weight Loss: What You Need to Know about Fad Diets" Try to lose between 1-2 pounds per week by taking in fewer calories and burning off more calories You can succeed by limiting portions, limiting foods dense in calories and fat, becoming more active, and drinking 8 glasses of water a day (64 ounces) Don't skip meals, especially breakfast, as skipping meals may alter your metabolism Do not use over-the-counter weight loss pills or gimmicks that claim rapid weight loss A healthy BMI (or body mass index) is between 18.5 and 24.9 You can calculate your ideal BMI at the NIH website JobEconomics.hu Start the new medicine and monitor your blood pressure Return fasting for the visit with CMA in 2 weeks and we'll get your labs then  DASH Eating Plan DASH stands for "Dietary Approaches to Stop Hypertension." The DASH eating plan is a healthy eating plan that has been shown to reduce high blood pressure (hypertension). It may also reduce your risk for type 2 diabetes, heart disease, and stroke. The DASH eating plan may also help with weight loss. What are tips for following this plan? General guidelines  Avoid eating more than  2,300 mg (milligrams) of salt (sodium) a day. If you have hypertension, you may need to reduce your sodium intake to 1,500 mg a day.  Limit alcohol intake to no more than 1 drink a day for nonpregnant women and 2 drinks a day for men. One drink equals 12 oz of beer, 5 oz of wine, or 1 oz of hard liquor.  Work with your health care provider to maintain a healthy body weight or to lose weight. Ask what an ideal weight is for you.  Get at least 30 minutes of exercise that causes your heart to beat faster (aerobic exercise) most days of the week. Activities may include walking, swimming, or biking.  Work with your health care provider or diet and nutrition specialist (dietitian) to adjust your eating plan to your individual calorie needs. Reading food labels  Check food labels for the amount of sodium per serving. Choose foods with less than 5 percent of the Daily Value of sodium. Generally, foods with less than 300 mg of sodium per serving fit into this eating plan.  To find whole grains, look for the word "whole" as the first word in the ingredient list. Shopping  Buy products labeled as "low-sodium" or "no salt added."  Buy fresh foods. Avoid canned foods and premade or frozen meals. Cooking  Avoid adding salt when cooking. Use salt-free seasonings or herbs instead of table salt or sea salt. Check with your health care provider or pharmacist before using salt substitutes.  Do not fry foods. Cook foods using healthy methods such as baking, boiling, grilling, and broiling instead.  Cook with heart-healthy oils, such as olive, canola, soybean, or sunflower oil. Meal planning   Eat a balanced diet that includes: ? 5 or more servings of fruits and vegetables each day. At each meal, try to fill half of your plate with fruits and vegetables. ? Up to 6-8 servings of whole grains each day. ? Less than 6 oz of lean meat, poultry, or fish each day. A 3-oz serving of meat is about the same size  as a deck of cards. One egg equals 1 oz. ? 2 servings of low-fat dairy each day. ? A serving of nuts, seeds, or beans 5 times each week. ? Heart-healthy fats. Healthy fats called Omega-3 fatty acids are found in foods such as flaxseeds and coldwater fish, like sardines, salmon, and mackerel.  Limit how much you eat of the following: ? Canned or prepackaged foods. ? Food that is high in trans fat, such as fried foods. ? Food that is high in saturated fat, such as fatty meat. ? Sweets, desserts, sugary drinks, and other foods with added sugar. ? Full-fat dairy products.  Do not salt foods before eating.  Try to eat at least 2 vegetarian meals each week.  Eat more home-cooked food and less restaurant, buffet, and fast food.  When eating at a restaurant, ask that your food be prepared with less salt or no salt, if possible. What foods are recommended? The items listed may not be a complete list. Talk with your dietitian about what dietary choices are best for you. Grains Whole-grain or whole-wheat bread. Whole-grain or whole-wheat pasta. Brown rice. Orpah Cobb. Bulgur. Whole-grain and low-sodium cereals. Pita bread. Low-fat, low-sodium crackers. Whole-wheat flour tortillas. Vegetables Fresh or frozen vegetables (raw, steamed, roasted, or grilled). Low-sodium or reduced-sodium tomato and vegetable juice. Low-sodium or reduced-sodium tomato sauce and tomato paste. Low-sodium or reduced-sodium canned vegetables. Fruits All fresh, dried, or frozen fruit. Canned fruit in natural juice (without added sugar). Meat and other protein foods Skinless chicken or [place]. Ground chicken or [place]. Pork with fat trimmed off. Fish and seafood. Egg whites. Dried beans, peas, or lentils. Unsalted nuts, nut butters, and seeds. Unsalted canned beans. Lean cuts of beef with fat trimmed off. Low-sodium, lean deli meat. Dairy Low-fat (1%) or fat-free (skim) milk. Fat-free, low-fat, or reduced-fat cheeses.  Nonfat, low-sodium ricotta or cottage cheese. Low-fat or nonfat yogurt. Low-fat, low-sodium cheese. Fats and oils Soft margarine without trans fats. Vegetable oil. Low-fat, reduced-fat, or light mayonnaise and salad dressings (reduced-sodium). Canola, safflower, olive, soybean, and sunflower oils. Avocado. Seasoning and other foods Herbs. Spices. Seasoning mixes without salt. Unsalted popcorn and pretzels. Fat-free sweets. What foods are not recommended? The items listed may not be a complete list. Talk with your dietitian about what dietary choices are best for you. Grains Baked goods made with fat, such as croissants, muffins, or some breads. Dry pasta or rice meal packs. Vegetables Creamed or fried vegetables. Vegetables in a cheese sauce. Regular canned vegetables (not low-sodium or reduced-sodium). Regular canned tomato sauce and paste (not low-sodium or reduced-sodium). Regular tomato and vegetable juice (not low-sodium or reduced-sodium). Rosita Fire. Olives. Fruits Canned fruit in a light or heavy syrup. Fried fruit. Fruit in cream or butter sauce. Meat and other protein foods Fatty cuts of meat. Ribs. Fried meat. Tomasa Blase. Sausage. Bologna and other processed lunch meats. Salami. Fatback. Hotdogs. Bratwurst. Salted nuts and seeds. Canned beans with added salt. Canned or smoked fish. Whole eggs or egg yolks. Chicken or [place] with skin. Dairy  Whole or 2% milk, cream, and half-and-half. Whole or full-fat cream cheese. Whole-fat or sweetened yogurt. Full-fat cheese. Nondairy creamers. Whipped toppings. Processed cheese and cheese spreads. Fats and oils Butter. Stick margarine. Lard. Shortening. Ghee. Bacon fat. Tropical oils, such as coconut, palm kernel, or palm oil. Seasoning and other foods Salted popcorn and pretzels. Onion salt, garlic salt, seasoned salt, table salt, and sea salt. Worcestershire sauce. Tartar sauce. Barbecue sauce. Teriyaki sauce. Soy sauce, including reduced-sodium. Steak  sauce. Canned and packaged gravies. Fish sauce. Oyster sauce. Cocktail sauce. Horseradish that you find on the shelf. Ketchup. Mustard. Meat flavorings and tenderizers. Bouillon cubes. Hot sauce and Tabasco sauce. Premade or packaged marinades. Premade or packaged taco seasonings. Relishes. Regular salad dressings. Where to find more information:  National Heart, Lung, and Blood Institute: PopSteam.iswww.nhlbi.nih.gov  American Heart Association: www.heart.org Summary  The DASH eating plan is a healthy eating plan that has been shown to reduce high blood pressure (hypertension). It may also reduce your risk for type 2 diabetes, heart disease, and stroke.  With the DASH eating plan, you should limit salt (sodium) intake to 2,300 mg a day. If you have hypertension, you may need to reduce your sodium intake to 1,500 mg a day.  When on the DASH eating plan, aim to eat more fresh fruits and vegetables, whole grains, lean proteins, low-fat dairy, and heart-healthy fats.  Work with your health care provider or diet and nutrition specialist (dietitian) to adjust your eating plan to your individual calorie needs. This information is not intended to replace advice given to you by your health care provider. Make sure you discuss any questions you have with your health care provider. Document Released: 05/22/2011 Document Revised: 05/26/2016 Document Reviewed: 05/26/2016 Elsevier Interactive Patient Education  [employer]2018 [employer].

## 2017-08-21 NOTE — Assessment & Plan Note (Signed)
Try DASH guidelines, weight loss encouraged; start low dose BP medicine; as weight comes down and BP comes down, hope to stop the pill

## 2017-08-21 NOTE — Assessment & Plan Note (Signed)
Encouraged diet lower in saturated fats; weight loss

## 2017-09-04 ENCOUNTER — Ambulatory Visit: Payer: BLUE CROSS/BLUE SHIELD

## 2017-09-08 ENCOUNTER — Ambulatory Visit: Payer: BLUE CROSS/BLUE SHIELD

## 2017-09-08 ENCOUNTER — Other Ambulatory Visit: Payer: Self-pay

## 2017-09-08 VITALS — BP 140/92 | HR 75

## 2017-09-08 DIAGNOSIS — R739 Hyperglycemia, unspecified: Secondary | ICD-10-CM

## 2017-09-08 DIAGNOSIS — E785 Hyperlipidemia, unspecified: Secondary | ICD-10-CM

## 2017-09-08 DIAGNOSIS — I159 Secondary hypertension, unspecified: Secondary | ICD-10-CM

## 2017-09-08 NOTE — Patient Instructions (Signed)
Dr Sherie DonLada wants you to increase the Amlodipine to 10 mg daily. You can take two of the 5 mg to make 10 mg. She would like to recheck your blood pressure in 10 days with new dose.

## 2017-09-17 ENCOUNTER — Ambulatory Visit: Payer: BLUE CROSS/BLUE SHIELD

## 2017-09-17 VITALS — BP 132/86 | HR 80

## 2017-09-17 DIAGNOSIS — I10 Essential (primary) hypertension: Secondary | ICD-10-CM

## 2017-09-17 NOTE — Patient Instructions (Signed)
Continue current dosage of Amlodipine 10mg .  Call if you continue to stay dizzy.  Follow-up with Dr. Sherie DonLada in 3 months.

## 2017-10-19 ENCOUNTER — Telehealth: Payer: Self-pay | Admitting: Family Medicine

## 2017-10-19 ENCOUNTER — Other Ambulatory Visit: Payer: Self-pay

## 2017-10-19 DIAGNOSIS — N138 Other obstructive and reflux uropathy: Secondary | ICD-10-CM

## 2017-10-19 DIAGNOSIS — N401 Enlarged prostate with lower urinary tract symptoms: Principal | ICD-10-CM

## 2017-10-19 MED ORDER — AMLODIPINE BESYLATE 10 MG PO TABS: 10.0000 mg | ORAL_TABLET | Freq: Every day | ORAL | 3 refills | Status: DC

## 2017-10-19 NOTE — Telephone Encounter (Signed)
Pt.notified

## 2017-10-19 NOTE — Telephone Encounter (Signed)
Copied from CRM [id number]. Topic: Quick Communication - Rx Refill/Question >> Oct 19, 2017  3:40 PM Jonette Eva wrote: Pt is asking for the amlodipine more b/c he increased his dosage and is needing a refill but insurance wouldn't allow, call pt to advise  Medication: amLODipine (NORVASC) 5 MG tablet [[id number]] , tamsulosin (FLOMAX) 0.4 MG CAPS capsule [[id number]]  Has the patient contacted their pharmacy? Yes.   (Agent: If no, request that the patient contact the pharmacy for the refill.) Preferred Pharmacy (with phone number or street name): [facility] Agent: Please be advised that RX refills may take up to 3 business days. We ask that you follow-up with your pharmacy.

## 2017-10-19 NOTE — Telephone Encounter (Signed)
Needs new dose change to  because was doubleing up and now he is out

## 2017-10-19 NOTE — Telephone Encounter (Signed)
I don't prescribe the flomax New Rx of amlodipine sent

## 2017-10-19 NOTE — Telephone Encounter (Signed)
The amlodipine dose should be 10 mg, not 5 mg; Rx sent earlier today I do not prescribe his tamsulosin; thank you

## 2017-11-10 ENCOUNTER — Telehealth: Payer: Self-pay | Admitting: Family Medicine

## 2017-11-10 NOTE — Telephone Encounter (Signed)
Patient is 47 years old now [employer] will not cover cost of testing until age 47, but please recommend colonoscopy for colon cancer screening; REFER if he agrees

## 2017-11-10 NOTE — Telephone Encounter (Signed)
Left detailed voicemail

## 2017-12-18 ENCOUNTER — Ambulatory Visit: Payer: BLUE CROSS/BLUE SHIELD | Admitting: Family Medicine

## 2017-12-24 ENCOUNTER — Encounter: Payer: Self-pay | Admitting: Nurse Practitioner

## 2017-12-25 ENCOUNTER — Encounter: Payer: Self-pay | Admitting: Nurse Practitioner

## 2017-12-25 ENCOUNTER — Ambulatory Visit: Payer: BLUE CROSS/BLUE SHIELD | Admitting: Nurse Practitioner

## 2017-12-25 VITALS — BP 130/90 | HR 79 | Temp 97.4°F | Resp 16 | Ht 71.0 in | Wt 252.3 lb

## 2017-12-25 DIAGNOSIS — M722 Plantar fascial fibromatosis: Secondary | ICD-10-CM

## 2017-12-25 DIAGNOSIS — I10 Essential (primary) hypertension: Secondary | ICD-10-CM | POA: Diagnosis not present

## 2017-12-25 NOTE — Progress Notes (Addendum)
Name: Jeffrey Rivers   MRN: [id number]    DOB: [birth year]   Date:12/25/2017       Progress Note  Subjective  Chief Complaint  Chief Complaint  Patient presents with  . Foot Pain    HPI  Patient endorses left heel pain that is worse when he wakes up in the middle of the night to go pee- started about 3 weeks ago. Patient states feels a little pain throughout the day sometimes but worst pain is first time stepping on the ground after resting. Has tried ibuprofen which provides some for relief for the time.    States has steel-toed sketchers shoes; is walking around all day at work.   Patient endorses right foot pain at the top of his foot intermittently for years. States feels knot on the top of his foot at times and it gets really tender.   Diastolic blood pressure is elevated today states has been taking increased dose of norvasc- 10mg  but needs to be better about remembering to take it daily   Patient Active Problem List   Diagnosis Date Noted  . Obesity 03/22/2016  . Hyperlipidemia LDL goal <100 03/17/2016  . BP (high blood pressure) 12/19/2015  . Hesitancy 12/19/2015  . Benign prostatic hyperplasia with urinary obstruction 11/29/2015  . Essential hypertension, benign 11/05/2015  . LVH (left ventricular hypertrophy) 11/05/2015  . Medication monitoring encounter 11/05/2015  . Prostate cancer screening 11/05/2015    Past Medical History:  Diagnosis Date  . Essential hypertension, benign 11/05/2015  . Hypertension     Past Surgical History:  Procedure Laterality Date  . APPENDECTOMY    . HERNIA REPAIR Right 06/05/14    Social History   Tobacco Use  . Smoking status: Never Smoker  . Smokeless tobacco: Never Used  Substance Use Topics  . Alcohol use: No     Current Outpatient Medications:  .  amLODipine (NORVASC) 10 MG tablet, Take 1 tablet (10 mg total) by mouth daily., Disp: 90 tablet, Rfl: 3 .  tamsulosin (FLOMAX) 0.4 MG CAPS capsule, Take 1 capsule (0.4 mg  total) by mouth daily., Disp: 30 capsule, Rfl: 3  No Known Allergies  ROS  No other specific complaints in a complete review of systems (except as listed in HPI above).  Objective  Vitals:   12/25/17 1528  BP: 130/90  Pulse: 79  Resp: 16  Temp: (!) 97.4 F (36.3 C)  TempSrc: Oral  SpO2: 96%  Weight: 252 lb 4.8 oz (114.4 kg)  Height: 5\' 11"  (1.803 m)     Body mass index is 35.19 kg/m.  Nursing Note and Vital Signs reviewed.  Physical Exam  Skin:        Constitutional: Patient appears well-developed and well-nourished. Obese No distress.  Cardiovascular: Normal rate, regular rhythm, S1/S2 present.   Pulmonary/Chest: Effort normal and breath sounds clear.  MSK: patient has left heel pain, non-tender no obvious bruising, redness or deformity noted. ; right foot superior aspect noted circular non-tender cyst-like area, no redness or warmth noted.  Psychiatric: Patient has a normal mood and affect. behavior is normal. Judgment and thought content normal.  No results found for this or any previous visit (from the past 72 hour(s)).  Assessment & Plan 1. Plantar fasciitis Discussed OTC management, stretching, ice and good foot support- if unimproving 4 weeks can send to podiatry or ortho  2. Essential hypertension, benign Diastolic elevated; discussed importance of adherence to medication regimen patient understanding will work on daily taking meds  and DASH diet and come back for BP check in 2 weeks.     -Red flags and when to present for emergency care or RTC including fever >101.31F, chest pain, shortness of breath, new/worsening/un-resolving symptoms, redness, or pain or drainage with foot reviewed with patient at time of visit. Follow up and care instructions discussed and provided in AVS.  ----------------------------------- I have reviewed this encounter including the documentation in this note and/or discussed this patient with the provider, Sharyon CableElizabeth Poulose DNP  AGNP-C. I am certifying that I agree with the content of this note as supervising physician. Baruch GoutyMelinda Lada, MD [facility] 12/28/2017, 5:34 PM

## 2017-12-25 NOTE — Patient Instructions (Addendum)
Our goal for your blood pressure is under 130/80  - Put water bottle in the freezer and roll under heel for pain relief, continue ibuprofen as directed on bottle, take with food, Google plantar faciitis stretches.   Plantar Fasciitis Plantar fasciitis is a painful foot condition that affects the heel. It occurs when the band of tissue that connects the toes to the heel bone (plantar fascia) becomes irritated. This can happen after exercising too much or doing other repetitive activities (overuse injury). The pain from plantar fasciitis can range from mild irritation to severe pain that makes it difficult for you to walk or move. The pain is usually worse in the morning or after you have been sitting or lying down for a while. What are the causes? This condition may be caused by:  Standing for long periods of time.  Wearing shoes that do not fit.  Doing high-impact activities, including running, aerobics, and ballet.  Being overweight.  Having an abnormal way of walking (gait).  Having tight calf muscles.  Having high arches in your feet.  Starting a new athletic activity.  What are the signs or symptoms? The main symptom of this condition is heel pain. Other symptoms include:  Pain that gets worse after activity or exercise.  Pain that is worse in the morning or after resting.  Pain that goes away after you walk for a few minutes.  How is this diagnosed? This condition may be diagnosed based on your signs and symptoms. Your health care provider will also do a physical exam to check for:  A tender area on the bottom of your foot.  A high arch in your foot.  Pain when you move your foot.  Difficulty moving your foot.  You may also need to have imaging studies to confirm the diagnosis. These can include:  X-rays.  Ultrasound.  MRI.  How is this treated? Treatment for plantar fasciitis depends on the severity of the condition. Your treatment may include:  Rest,  ice, and over-the-counter pain medicines to manage your pain.  Exercises to stretch your calves and your plantar fascia.  A splint that holds your foot in a stretched, upward position while you sleep (night splint).  Physical therapy to relieve symptoms and prevent problems in the future.  Cortisone injections to relieve severe pain.  Extracorporeal shock wave therapy (ESWT) to stimulate damaged plantar fascia with electrical impulses. It is often used as a last resort before surgery.  Surgery, if other treatments have not worked after 12 months.  Follow these instructions at home:  Take medicines only as directed by your health care provider.  Avoid activities that cause pain.  Roll the bottom of your foot over a bag of ice or a bottle of cold water. Do this for 20 minutes, 3-4 times a day.  Perform simple stretches as directed by your health care provider.  Try wearing athletic shoes with air-sole or gel-sole cushions or soft shoe inserts.  Wear a night splint while sleeping, if directed by your health care provider.  Keep all follow-up appointments with your health care provider. How is this prevented?  Do not perform exercises or activities that cause heel pain.  Consider finding low-impact activities if you continue to have problems.  Lose weight if you need to. The best way to prevent plantar fasciitis is to avoid the activities that aggravate your plantar fascia. Contact a health care provider if:  Your symptoms do not go away after treatment with home  care measures.  Your pain gets worse.  Your pain affects your ability to move or do your daily activities. This information is not intended to replace advice given to you by your health care provider. Make sure you discuss any questions you have with your health care provider. Document Released: 02/25/2001 Document Revised: 11/05/2015 Document Reviewed: 04/12/2014 Elsevier Interactive Patient Education  2018  [employer].   DASH Eating Plan DASH stands for "Dietary Approaches to Stop Hypertension." The DASH eating plan is a healthy eating plan that has been shown to reduce high blood pressure (hypertension). It may also reduce your risk for type 2 diabetes, heart disease, and stroke. The DASH eating plan may also help with weight loss. What are tips for following this plan? General guidelines  Avoid eating more than 2,300 mg (milligrams) of salt (sodium) a day. If you have hypertension, you may need to reduce your sodium intake to 1,500 mg a day.  Limit alcohol intake to no more than 1 drink a day for nonpregnant women and 2 drinks a day for men. One drink equals 12 oz of beer, 5 oz of wine, or 1 oz of hard liquor.  Work with your health care provider to maintain a healthy body weight or to lose weight. Ask what an ideal weight is for you.  Get at least 30 minutes of exercise that causes your heart to beat faster (aerobic exercise) most days of the week. Activities may include walking, swimming, or biking.  Work with your health care provider or diet and nutrition specialist (dietitian) to adjust your eating plan to your individual calorie needs. Reading food labels  Check food labels for the amount of sodium per serving. Choose foods with less than 5 percent of the Daily Value of sodium. Generally, foods with less than 300 mg of sodium per serving fit into this eating plan.  To find whole grains, look for the word "whole" as the first word in the ingredient list. Shopping  Buy products labeled as "low-sodium" or "no salt added."  Buy fresh foods. Avoid canned foods and premade or frozen meals. Cooking  Avoid adding salt when cooking. Use salt-free seasonings or herbs instead of table salt or sea salt. Check with your health care provider or pharmacist before using salt substitutes.  Do not fry foods. Cook foods using healthy methods such as baking, boiling, grilling, and broiling  instead.  Cook with heart-healthy oils, such as olive, canola, soybean, or sunflower oil. Meal planning   Eat a balanced diet that includes: ? 5 or more servings of fruits and vegetables each day. At each meal, try to fill half of your plate with fruits and vegetables. ? Up to 6-8 servings of whole grains each day. ? Less than 6 oz of lean meat, poultry, or fish each day. A 3-oz serving of meat is about the same size as a deck of cards. One egg equals 1 oz. ? 2 servings of low-fat dairy each day. ? A serving of nuts, seeds, or beans 5 times each week. ? Heart-healthy fats. Healthy fats called Omega-3 fatty acids are found in foods such as flaxseeds and coldwater fish, like sardines, salmon, and mackerel.  Limit how much you eat of the following: ? Canned or prepackaged foods. ? Food that is high in trans fat, such as fried foods. ? Food that is high in saturated fat, such as fatty meat. ? Sweets, desserts, sugary drinks, and other foods with added sugar. ? Full-fat dairy products.  Do not salt foods before eating.  Try to eat at least 2 vegetarian meals each week.  Eat more home-cooked food and less restaurant, buffet, and fast food.  When eating at a restaurant, ask that your food be prepared with less salt or no salt, if possible. What foods are recommended? The items listed may not be a complete list. Talk with your dietitian about what dietary choices are best for you. Grains Whole-grain or whole-wheat bread. Whole-grain or whole-wheat pasta. Brown rice. Orpah Cobbatmeal. Quinoa. Bulgur. Whole-grain and low-sodium cereals. Pita bread. Low-fat, low-sodium crackers. Whole-wheat flour tortillas. Vegetables Fresh or frozen vegetables (raw, steamed, roasted, or grilled). Low-sodium or reduced-sodium tomato and vegetable juice. Low-sodium or reduced-sodium tomato sauce and tomato paste. Low-sodium or reduced-sodium canned vegetables. Fruits All fresh, dried, or frozen fruit. Canned fruit in  natural juice (without added sugar). Meat and other protein foods Skinless chicken or Malawiturkey. Ground chicken or Mal[place]iturkey. Pork with fat trimmed off. Fish and seafood. Egg whites. Dried beans, peas, or lentils. Unsalted nuts, nut butters, and seeds. Unsalted canned beans. Lean cuts of beef with fat trimmed off. Low-sodium, lean deli meat. Dairy Low-fat (1%) or fat-free (skim) milk. Fat-free, low-fat, or reduced-fat cheeses. Nonfat, low-sodium ricotta or cottage cheese. Low-fat or nonfat yogurt. Low-fat, low-sodium cheese. Fats and oils Soft margarine without trans fats. Vegetable oil. Low-fat, reduced-fat, or light mayonnaise and salad dressings (reduced-sodium). Canola, safflower, olive, soybean, and sunflower oils. Avocado. Seasoning and other foods Herbs. Spices. Seasoning mixes without salt. Unsalted popcorn and pretzels. Fat-free sweets. What foods are not recommended? The items listed may not be a complete list. Talk with your dietitian about what dietary choices are best for you. Grains Baked goods made with fat, such as croissants, muffins, or some breads. Dry pasta or rice meal packs. Vegetables Creamed or fried vegetables. Vegetables in a cheese sauce. Regular canned vegetables (not low-sodium or reduced-sodium). Regular canned tomato sauce and paste (not low-sodium or reduced-sodium). Regular tomato and vegetable juice (not low-sodium or reduced-sodium). Rosita FirePickles. Olives. Fruits Canned fruit in a light or heavy syrup. Fried fruit. Fruit in cream or butter sauce. Meat and other protein foods Fatty cuts of meat. Ribs. Fried meat. Tomasa BlaseBacon. Sausage. Bologna and other processed lunch meats. Salami. Fatback. Hotdogs. Bratwurst. Salted nuts and seeds. Canned beans with added salt. Canned or smoked fish. Whole eggs or egg yolks. Chicken or Malawiturkey with skin. Dairy Whole or 2% milk, cream, and half-and-half. Whole or full-fat cream cheese. Whole-fat or sweetened yogurt. Full-fat cheese. Nondairy  creamers. Whipped toppings. Processed cheese and cheese spreads. Fats and oils Butter. Stick margarine. Lard. Shortening. Ghee. Bacon fat. Tropical oils, such as coconut, palm kernel, or palm oil. Seasoning and other foods Salted popcorn and pretzels. Onion salt, garlic salt, seasoned salt, table salt, and sea salt. Worcestershire sauce. Tartar sauce. Barbecue sauce. Teriyaki sauce. Soy sauce, including reduced-sodium. Steak sauce. Canned and packaged gravies. Fish sauce. Oyster sauce. Cocktail sauce. Horseradish that you find on the shelf. Ketchup. Mustard. Meat flavorings and tenderizers. Bouillon cubes. Hot sauce and Tabasco sauce. Premade or packaged marinades. Premade or packaged taco seasonings. Relishes. Regular salad dressings. Where to find more information:  National Heart, Lung, and Blood Institute: Pop[employer].iswww.nhlbi.nih.gov  American Heart Association: www.heart.org Summary  The DASH eating plan is a healthy eating plan that has been shown to reduce high blood pressure (hypertension). It may also reduce your risk for type 2 diabetes, heart disease, and stroke.  With the DASH eating plan, you should limit salt (sodium)  intake to 2,300 mg a day. If you have hypertension, you may need to reduce your sodium intake to 1,500 mg a day.  When on the DASH eating plan, aim to eat more fresh fruits and vegetables, whole grains, lean proteins, low-fat dairy, and heart-healthy fats.  Work with your health care provider or diet and nutrition specialist (dietitian) to adjust your eating plan to your individual calorie needs. This information is not intended to replace advice given to you by your health care provider. Make sure you discuss any questions you have with your health care provider. Document Released: 05/22/2011 Document Revised: 05/26/2016 Document Reviewed: 05/26/2016 Elsevier Interactive Patient Education  [employer].

## 2018-01-08 ENCOUNTER — Ambulatory Visit: Payer: BLUE CROSS/BLUE SHIELD

## 2018-01-08 VITALS — BP 118/88 | HR 71

## 2018-01-08 DIAGNOSIS — I10 Essential (primary) hypertension: Secondary | ICD-10-CM

## 2018-01-16 ENCOUNTER — Ambulatory Visit: Payer: BLUE CROSS/BLUE SHIELD | Admitting: Sports Medicine

## 2018-01-16 ENCOUNTER — Encounter: Payer: Self-pay | Admitting: Sports Medicine

## 2018-01-16 ENCOUNTER — Ambulatory Visit (INDEPENDENT_AMBULATORY_CARE_PROVIDER_SITE_OTHER): Payer: BLUE CROSS/BLUE SHIELD

## 2018-01-16 VITALS — BP 140/50 | HR 80 | Temp 97.0°F | Resp 16

## 2018-01-16 DIAGNOSIS — M79672 Pain in left foot: Secondary | ICD-10-CM | POA: Diagnosis not present

## 2018-01-16 DIAGNOSIS — M2141 Flat foot [pes planus] (acquired), right foot: Secondary | ICD-10-CM | POA: Diagnosis not present

## 2018-01-16 DIAGNOSIS — M722 Plantar fascial fibromatosis: Secondary | ICD-10-CM

## 2018-01-16 DIAGNOSIS — M2142 Flat foot [pes planus] (acquired), left foot: Secondary | ICD-10-CM

## 2018-01-16 MED ORDER — TRIAMCINOLONE ACETONIDE 10 MG/ML IJ SUSP: 10.0000 mg | Freq: Once | INTRAMUSCULAR | Status: DC

## 2018-01-16 MED ORDER — METHYLPREDNISOLONE 4 MG PO TBPK: ORAL_TABLET | ORAL | 0 refills | Status: DC

## 2018-01-16 NOTE — Progress Notes (Signed)
Subjective: Jeffrey Rivers is a 47 y.o. male patient presents to office with complaint of moderate heel pain on the left that extends out into the arch. Patient admits to post static dyskinesia for 1 month in duration that is worsened by shoes pain is 10 out of 10 sharp in nature especially with pressure and with lots of walking and standing. Patient has treated this problem with Aleve with no relief. Denies any other pedal complaints.   Review of Systems  Musculoskeletal: Positive for joint pain and myalgias.       Joint swelling  All other systems reviewed and are negative.    Patient Active Problem List   Diagnosis Date Noted  . Obesity 03/22/2016  . Hyperlipidemia LDL goal <100 03/17/2016  . BP (high blood pressure) 12/19/2015  . Hesitancy 12/19/2015  . Benign prostatic hyperplasia with urinary obstruction 11/29/2015  . Essential hypertension, benign 11/05/2015  . LVH (left ventricular hypertrophy) 11/05/2015  . Medication monitoring encounter 11/05/2015  . Prostate cancer screening 11/05/2015    Current Outpatient Medications on File Prior to Visit  Medication Sig Dispense Refill  . amLODipine (NORVASC) 10 MG tablet Take 1 tablet (10 mg total) by mouth daily. 90 tablet 3   No current facility-administered medications on file prior to visit.     No Known Allergies  Objective: Physical Exam General: The patient is alert and oriented x3 in no acute distress.  Dermatology: Skin is warm, dry and supple bilateral lower extremities. Nails 1-10 are within normal limits. There is no erythema, edema, no eccymosis, no open lesions present. Integument is otherwise unremarkable.  Vascular: Dorsalis Pedis pulse and Posterior Tibial pulse are 2/4 bilateral. Capillary fill time is immediate to all digits.  Neurological: Grossly intact to light touch with an achilles reflex of +2/5 and a  negative Tinel's sign bilateral.  Musculoskeletal: Tenderness to palpation at the medial  calcaneal tubercale and through the insertion of the plantar fascia on the left foot. No pain with compression of calcaneus bilateral. No pain with tuning fork to calcaneus bilateral. No pain with calf compression bilateral. There is decreased Ankle joint range of motion bilateral. All other joints range of motion within normal limits bilateral.  Pes planus bilateral.  Strength 5/5 in all groups bilateral.   Xray, Left foot:  Normal osseous mineralization. Joint spaces preserved except at midtarsal joint where there is breech supportive of pes planus. No fracture/dislocation/boney destruction. Calcaneal spur present with mild thickening of plantar fascia. No other soft tissue abnormalities or radiopaque foreign bodies.   Assessment and Plan: Problem List Items Addressed This Visit    None    Visit Diagnoses    Plantar fasciitis    -  Primary   Relevant Medications   triamcinolone acetonide (KENALOG) 10 MG/ML injection 10 mg (Start on 01/16/2018  4:30 PM)   Left foot pain       Relevant Orders   DG Foot Complete Left   Pes planus of both feet          -Complete examination performed.  -Xrays reviewed -Discussed with patient in detail the condition of plantar fasciitis with pes planus, how this occurs and general treatment options. Explained both conservative and surgical treatments.  -After oral consent and aseptic prep, injected a mixture containing 1 ml of 2%  plain lidocaine, 1 ml 0.5% plain marcaine, 0.5 ml of kenalog 10 and 0.5 ml of dexamethasone phosphate into left heel. Post-injection care discussed with patient.  -Rx Medrol Dosepak -Recommended  good supportive shoes and advised use of OTC insert. Explained to patient that if these orthoses work well, we will continue with these. If these do not improve his condition and  pain, we will consider custom molded orthoses. - Explained in detail the use of the fascial brace for the left which was dispensed at today's visit. -Explained and  dispensed to patient daily stretching exercises. -Recommend patient to ice affected area 1-2x daily. -Patient to return to call office for follow-up after 2 weeks if pain is not better or sooner if problems or questions arise.  Asencion Islamitorya Riham Polyakov, DPM

## 2018-01-16 NOTE — Patient Instructions (Signed)

## 2018-01-16 NOTE — Progress Notes (Signed)
   Subjective:    Patient ID: Jeffrey Rivers, male    DOB: December 21, 1970, 47 y.o.   MRN: [id number]  HPI    Review of Systems  Musculoskeletal: Positive for joint swelling and myalgias.  All other systems reviewed and are negative.      Objective:   Physical Exam        Assessment & Plan:

## 2018-01-18 ENCOUNTER — Ambulatory Visit: Payer: BLUE CROSS/BLUE SHIELD | Admitting: Urology

## 2018-01-18 ENCOUNTER — Other Ambulatory Visit: Payer: Self-pay | Admitting: Sports Medicine

## 2018-01-18 DIAGNOSIS — M722 Plantar fascial fibromatosis: Secondary | ICD-10-CM

## 2018-01-18 DIAGNOSIS — M2142 Flat foot [pes planus] (acquired), left foot: Secondary | ICD-10-CM

## 2018-01-18 DIAGNOSIS — M2141 Flat foot [pes planus] (acquired), right foot: Secondary | ICD-10-CM

## 2018-01-18 DIAGNOSIS — M79672 Pain in left foot: Secondary | ICD-10-CM

## 2018-01-25 ENCOUNTER — Ambulatory Visit: Payer: BLUE CROSS/BLUE SHIELD | Admitting: Urology

## 2018-01-25 ENCOUNTER — Encounter: Payer: Self-pay | Admitting: Urology

## 2018-01-25 VITALS — BP 135/84 | HR 76 | Ht 70.0 in | Wt 240.0 lb

## 2018-01-25 DIAGNOSIS — R3912 Poor urinary stream: Secondary | ICD-10-CM

## 2018-01-25 MED ORDER — TAMSULOSIN HCL 0.4 MG PO CAPS: 0.4000 mg | ORAL_CAPSULE | Freq: Every day | ORAL | 11 refills | Status: DC

## 2018-01-25 NOTE — Progress Notes (Signed)
01/25/2018 2:42 PM   Bud Facealvin L Mott 1970-07-22 [id number]  Referring provider: Kerman PasseyLada, Melinda P, MD [address] [place], [place] [address]  Chief Complaint  Patient presents with  . Benign Prostatic Hypertrophy    follow up    HPI: 2017: Dr Signe ColtEsk: Patient returns with h/o BPH and symptoms of weak stream, frequency, urgency, nocturia, intermittent flow, hesitancy and straining to void. He's had symptoms for several months. He tried tamsulosin which improved his symptoms but gave him dizziness. He denies trouble getting or maintaining an erection. He is most bothered hesitancy and weak stream which follows an urge to void. His PVR was 0 ml.   PSA was 0.19 Oct 2015.    He tried alfuzosin but it made him feel drowsy and gave him heart palpitations and he stopped. Of note it did improve his weak stream.  The patient had cystoscopy noting a prominent obstructing median lobe.  He was counseled about transurethral resection versus other procedures and was given a trial of Rapaflo  Today I believe the patient is back on Flomax.  Flow is much better.  Nocturia grade 2.  Clinically no infections.  No blood in the urine  40 g benign prostate  Urinalysis negative   PMH: Past Medical History:  Diagnosis Date  . Essential hypertension, benign 11/05/2015  . Hypertension     Surgical History: Past Surgical History:  Procedure Laterality Date  . APPENDECTOMY    . HERNIA REPAIR Right 06/05/14    Home Medications:  Allergies as of 01/25/2018   No Known Allergies     Medication List        Accurate as of 01/25/18  2:42 PM. Always use your most recent med list.          amLODipine 10 MG tablet Commonly known as:  NORVASC Take 1 tablet (10 mg total) by mouth daily.   tamsulosin 0.4 MG Caps capsule Commonly known as:  FLOMAX Take 1 capsule (0.4 mg total) by mouth daily.       Allergies: No Known Allergies  Family History: Family History  Problem Relation Age  of Onset  . Cancer Father [id number]       colon  . Prostate cancer Neg Hx   . Kidney cancer Neg Hx     Social History:  reports that he has never smoked. He has never used smokeless tobacco. He reports that he does not drink alcohol or use drugs.  ROS: UROLOGY Frequent Urination?: No Hard to postpone urination?: No Burning/pain with urination?: No Get up at night to urinate?: Yes Leakage of urine?: No Urine stream starts and stops?: Yes Trouble starting stream?: Yes Do you have to strain to urinate?: No Blood in urine?: No Urinary tract infection?: No Sexually transmitted disease?: No Injury to kidneys or bladder?: No Painful intercourse?: No Weak stream?: Yes Erection problems?: No Penile pain?: No  Gastrointestinal Nausea?: No Vomiting?: No Diarrhea?: No Constipation?: No  Constitutional Fever: No Night sweats?: No Weight loss?: No Fatigue?: No  Skin Skin rash/lesions?: No Itching?: No  Eyes Blurred vision?: No Double vision?: No  Ears/Nose/Throat Sore throat?: No Sinus problems?: No  Hematologic/Lymphatic Swollen glands?: No Easy bruising?: No  Cardiovascular Leg swelling?: No Chest pain?: No  Respiratory Cough?: No Shortness of breath?: No  Endocrine Excessive thirst?: No  Musculoskeletal Back pain?: No Joint pain?: No  Neurological Headaches?: No Dizziness?: No  Psychologic Depression?: No Anxiety?: No  Physical Exam: BP 135/84   Pulse 76  Ht 5\' 10"  (1.778 m)   Wt 240 lb (108.9 kg)   BMI 34.44 kg/m   Constitutional:  Alert and oriented, No acute distress.  Laboratory Data: Lab Results  Component Value Date   WBC 4.5 08/23/2015   HGB 14.8 08/23/2015   HCT 44.4 08/23/2015   MCV 83.5 08/23/2015   PLT 184 08/23/2015    Lab Results  Component Value Date   CREATININE 0.98 02/02/2017    Lab Results  Component Value Date   PSA 0.4 02/02/2017    No results found for: TESTOSTERONE  No results found for:  HGBA1C  Urinalysis    Component Value Date/Time   APPEARANCEUR Clear 01/14/2016 1549   GLUCOSEU Negative 01/14/2016 1549   BILIRUBINUR Negative 01/14/2016 1549   PROTEINUR Negative 01/14/2016 1549   NITRITE Negative 01/14/2016 1549   LEUKOCYTESUR Negative 01/14/2016 1549    Pertinent Imaging:   Assessment & Plan: The patient is tolerating Flomax and is symptomatically improved.  Prescription given I will see in 1 year  There are no diagnoses linked to this encounter.  No follow-ups on file.  Martina SinnerMACDIARMID,Jerricka Carvey A, MD  [facility] [address], [address] [place], [place] [address] [phone]

## 2018-02-18 ENCOUNTER — Encounter: Payer: Self-pay | Admitting: Family Medicine

## 2018-02-18 ENCOUNTER — Ambulatory Visit: Payer: BLUE CROSS/BLUE SHIELD | Admitting: Family Medicine

## 2018-02-18 VITALS — BP 118/84 | HR 65 | Temp 98.5°F | Resp 18 | Ht 70.0 in | Wt 252.0 lb

## 2018-02-18 DIAGNOSIS — M5441 Lumbago with sciatica, right side: Secondary | ICD-10-CM | POA: Diagnosis not present

## 2018-02-18 MED ORDER — PREDNISONE 5 MG (48) PO TBPK: ORAL_TABLET | ORAL | 0 refills | Status: DC

## 2018-02-18 MED ORDER — TIZANIDINE HCL 4 MG PO TABS: 2.0000 mg | ORAL_TABLET | Freq: Three times a day (TID) | ORAL | 0 refills | Status: DC | PRN

## 2018-02-18 NOTE — Progress Notes (Signed)
Name: DAYREN THIEN   MRN: [id number]    DOB: October 09, 1970   Date:02/18/2018       Progress Note  Subjective  Chief Complaint  Chief Complaint  Patient presents with  . Sciatica    pain that radiates down legs for 2 weeks. Pain got worse last night    HPI  Pt presents with concern for RIGHT sided low back pain with radiation into the RLE. Pain has been intermittent but increasing in frequency and intensity over the last 3 weeks.  Has tried ibuprofen and it did not help.  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  He denies weakness of the RLE, no numbness or tingling.  Has never had similar issue in the past.  Pt is not diabetic - discussed prednisone taper as option for inflammation and he is agreeable along with muscle relaxer.  He did purchase new shoes yesterday for work that are more supportive. He notes he recently suffered from plantar fasciitis on the LEFT side, and has had an antalgic gait because of this - advised this may have contributed to inflaming his sciatic nerve on the right.   Patient Active Problem List   Diagnosis Date Noted  . Obesity 03/22/2016  . Hyperlipidemia LDL goal <100 03/17/2016  . BP (high blood pressure) 12/19/2015  . Hesitancy 12/19/2015  . Benign prostatic hyperplasia with urinary obstruction 11/29/2015  . Essential hypertension, benign 11/05/2015  . LVH (left ventricular hypertrophy) 11/05/2015  . Medication monitoring encounter 11/05/2015  . Prostate cancer screening 11/05/2015    Social History   Tobacco Use  . Smoking status: Never Smoker  . Smokeless tobacco: Never Used  Substance Use Topics  . Alcohol use: No     Current Outpatient Medications:  .  tamsulosin (FLOMAX) 0.4 MG CAPS capsule, Take 1 capsule (0.4 mg total) by mouth daily., Disp: 30 capsule, Rfl: 11 .  amLODipine (NORVASC) 10 MG tablet, Take 1 tablet (10 mg total) by mouth daily., Disp: 90 tablet, Rfl:  3  Current Facility-Administered Medications:  .  triamcinolone acetonide (KENALOG) 10 MG/ML injection 10 mg, 10 mg, Other, Once, Stover, Titorya, DPM  No Known Allergies  ROS  Ten systems reviewed and is negative except as mentioned in HPI  Objective  Vitals:   02/18/18 1510  BP: 118/84  Pulse: 65  Resp: 18  Temp: 98.5 F (36.9 C)  TempSrc: Oral  SpO2: 97%  Weight: 252 lb (114.3 kg)  Height: 5\' 10"  (1.778 m)    Body mass index is 36.16 kg/m.  Nursing Note and Vital Signs reviewed.  Physical Exam  Constitutional: Patient appears well-developed and well-nourished. No distress.  HENT: Head: Normocephalic and atraumatic. Neck: Normal range of motion. Neck supple. No JVD present. No thyromegaly present.  Cardiovascular: Normal rate, regular rhythm and normal heart sounds.  No murmur heard. No BLE edema. Pulmonary/Chest: Effort normal and breath sounds normal. No respiratory distress. Musculoskeletal: Normal range of motion, no joint effusions. No gross deformities. Tenderness over the right low back musculature; no weakness to BLE, no swelling or erythema.  Neurological: he is alert and oriented to person, place, and time. No cranial nerve deficit. Coordination, balance, strength, speech and gait are normal.  Skin: Skin is warm and dry. No rash noted. No erythema.  Psychiatric: Patient has a normal mood and affect. behavior is normal. Judgment and thought content normal.  No results  found for this or any previous visit (from the past 72 hour(s)).  Assessment & Plan  1. Acute right-sided low back pain with right-sided sciatica - Exercises discussed, advised to apply heat, wear supportive footwear. - predniSONE (STERAPRED UNI-PAK 48 TAB) 5 MG (48) TBPK tablet; Take as directed  Dispense: 48 tablet; Refill: 0 - tiZANidine (ZANAFLEX) 4 MG tablet; Take 0.5-1 tablets (2-4 mg total) by mouth every 8 (eight) hours as needed for muscle spasms.  Dispense: 30 tablet; Refill:  0  -Red flags and when to present for emergency care or RTC including fever >101.27F, chest pain, shortness of breath, new/worsening/un-resolving symptoms, saddle anesthesia and/or LE weakness/numbness reviewed with patient at time of visit. Follow up and care instructions discussed and provided in AVS.

## 2018-02-18 NOTE — Patient Instructions (Signed)
Back Exercises If you have pain in your back, do these exercises 2-3 times each day or as told by your doctor. When the pain goes away, do the exercises once each day, but repeat the steps more times for each exercise (do more repetitions). If you do not have pain in your back, do these exercises once each day or as told by your doctor. Exercises Single Knee to Chest  Do these steps 3-5 times in a row for each leg: 1. Lie on your back on a firm bed or the floor with your legs stretched out. 2. Bring one knee to your chest. 3. Hold your knee to your chest by grabbing your knee or thigh. 4. Pull on your knee until you feel a gentle stretch in your lower back. 5. Keep doing the stretch for 10-30 seconds. 6. Slowly let go of your leg and straighten it.  Pelvic Tilt  Do these steps 5-10 times in a row: 1. Lie on your back on a firm bed or the floor with your legs stretched out. 2. Bend your knees so they point up to the ceiling. Your feet should be flat on the floor. 3. Tighten your lower belly (abdomen) muscles to press your lower back against the floor. This will make your tailbone point up to the ceiling instead of pointing down to your feet or the floor. 4. Stay in this position for 5-10 seconds while you gently tighten your muscles and breathe evenly.  Cat-Cow  Do these steps until your lower back bends more easily: 1. Get on your hands and knees on a firm surface. Keep your hands under your shoulders, and keep your knees under your hips. You may put padding under your knees. 2. Let your head hang down, and make your tailbone point down to the floor so your lower back is round like the back of a cat. 3. Stay in this position for 5 seconds. 4. Slowly lift your head and make your tailbone point up to the ceiling so your back hangs low (sags) like the back of a cow. 5. Stay in this position for 5 seconds.  Press-Ups  Do these steps 5-10 times in a row: 1. Lie on your belly (face-down)  on the floor. 2. Place your hands near your head, about shoulder-width apart. 3. While you keep your back relaxed and keep your hips on the floor, slowly straighten your arms to raise the top half of your body and lift your shoulders. Do not use your back muscles. To make yourself more comfortable, you may change where you place your hands. 4. Stay in this position for 5 seconds. 5. Slowly return to lying flat on the floor.  Bridges  Do these steps 10 times in a row: 1. Lie on your back on a firm surface. 2. Bend your knees so they point up to the ceiling. Your feet should be flat on the floor. 3. Tighten your butt muscles and lift your butt off of the floor until your waist is almost as high as your knees. If you do not feel the muscles working in your butt and the back of your thighs, slide your feet 1-2 inches farther away from your butt. 4. Stay in this position for 3-5 seconds. 5. Slowly lower your butt to the floor, and let your butt muscles relax.  If this exercise is too easy, try doing it with your arms crossed over your chest. Belly Crunches  Do these steps 5-10 times in   a row: 1. Lie on your back on a firm bed or the floor with your legs stretched out. 2. Bend your knees so they point up to the ceiling. Your feet should be flat on the floor. 3. Cross your arms over your chest. 4. Tip your chin a little bit toward your chest but do not bend your neck. 5. Tighten your belly muscles and slowly raise your chest just enough to lift your shoulder blades a tiny bit off of the floor. 6. Slowly lower your chest and your head to the floor.  Back Lifts Do these steps 5-10 times in a row: 1. Lie on your belly (face-down) with your arms at your sides, and rest your forehead on the floor. 2. Tighten the muscles in your legs and your butt. 3. Slowly lift your chest off of the floor while you keep your hips on the floor. Keep the back of your head in line with the curve in your back. Look at  the floor while you do this. 4. Stay in this position for 3-5 seconds. 5. Slowly lower your chest and your face to the floor.  Contact a doctor if:  Your back pain gets a lot worse when you do an exercise.  Your back pain does not lessen 2 hours after you exercise. If you have any of these problems, stop doing the exercises. Do not do them again unless your doctor says it is okay. Get help right away if:  You have sudden, very bad back pain. If this happens, stop doing the exercises. Do not do them again unless your doctor says it is okay. This information is not intended to replace advice given to you by your health care provider. Make sure you discuss any questions you have with your health care provider. Document Released: 07/05/2010 Document Revised: 11/08/2015 Document Reviewed: 07/27/2014 Elsevier Interactive Patient Education  2018 [place].  Radicular Pain Radicular pain is a type of pain that spreads from your back or neck along a spinal nerve. Spinal nerves are nerves that leave the spinal cord and go to the muscles. Radicular pain occurs when one of these nerves becomes irritated or squeezed (compressed). Radicular pain is sometimes called radiculopathy, radiculitis, or a pinched nerve. When you have this type of pain, you may also have weakness, numbness, or tingling in the area of your body that is supplied by the nerve. The pain may feel sharp and burning. Spinal nerves leave the spinal cord through openings between the 24 bones (vertebrae) that make up the spine. Radicular pain is often caused by something pushing on a spinal nerve. This pushing may be done by a vertebra or by one of the round cushions between vertebrae (intervertebral disks). This can result from an injury, from wear and tear or aging of a disk, or from the growth of a bone spur that pushes on the nerve. Radicular pain can occur in various areas depending on which spinal nerve is affected:  Cervical  radicular pain occurs in the neck. You may also feel pain, numbness, weakness, or tingling in the arms.  Thoracic radicular pain occurs in the mid-spine area. You would feel this pain in the back and chest. This type is rare.  Lumbar radicular pain occurs in the lower back area. You would feel this pain as low back pain. You may feel pain, numbness, weakness, or tingling in the buttocks or legs. Sciatica is a type of lumbar radicular pain that shoots down the back of  the leg.  Radicular pain often goes away when you follow instructions from your health care provider for relieving pain at home. Follow these instructions at home: Managing pain  If directed, apply ice to the affected area: ? Put ice in a plastic bag. ? Place a towel between your skin and the bag. ? Leave the ice on for 20 minutes, 2-3 times a day.  If directed, apply heat to the affected area as often as told by your health care provider. Use the heat source that your health care provider recommends, such as a moist heat pack or a heating pad. ? Place a towel between your skin and the heat source. ? Leave the heat on for 20-30 minutes. ? Remove the heat if your skin turns bright red. This is especially important if you are unable to feel pain, heat, or cold. You may have a greater risk of getting burned. Activity   Do not sit or rest in bed for long periods of time.  Try to stay as active as possible. Ask your health care provider what type of exercise or activity is best for you.  Avoid activities that make your pain worse, such as bending and lifting.  Do not lift anything that is heavier than 10 lb (4.5 kg). Practice using proper technique when lifting items. Proper lifting technique involves bending your knees and rising up.  Do strength and range-of-motion exercises only as told by your health care provider. General instructions  Take over-the-counter and prescription medicines only as told by your health care  provider.  Pay attention to any changes in your symptoms.  Keep all follow-up visits as told by your health care provider. This is important. Contact a health care provider if:  Your pain and other symptoms get worse.  Your pain medicine is not helping.  Your pain has not improved after a few weeks of home care.  You have a fever. Get help right away if:  You have severe pain, weakness, or numbness.  You have difficulty with bladder or bowel control. This information is not intended to replace advice given to you by your health care provider. Make sure you discuss any questions you have with your health care provider. Document Released: 07/10/2004 Document Revised: 11/08/2015 Document Reviewed: 12/27/2014 Elsevier Interactive Patient Education  2018 [employer]  Sciatica Sciatica is pain, numbness, weakness, or tingling along your sciatic nerve. The sciatic nerve starts in the lower back and goes down the back of each leg. Sciatica happens when this nerve is pinched or has pressure put on it. Sciatica usually goes away on its own or with treatment. Sometimes, sciatica may keep coming back (recur). Follow these instructions at home: Medicines  Take over-the-counter and prescription medicines only as told by your doctor.  Do not drive or use heavy machinery while taking prescription pain medicine. Managing pain  If directed, put ice on the affected area. ? Put ice in a plastic bag. ? Place a towel between your skin and the bag. ? Leave the ice on for 20 minutes, 2-3 times a day.  After icing, apply heat to the affected area before you exercise or as often as told by your doctor. Use the heat source that your doctor tells you to use, such as a moist heat pack or a heating pad. ? Place a towel between your skin and the heat source. ? Leave the heat on for 20-30 minutes. ? Remove the heat if your skin turns bright red.  This is especially important if you are unable to feel  pain, heat, or cold. You may have a greater risk of getting burned. Activity  Return to your normal activities as told by your doctor. Ask your doctor what activities are safe for you. ? Avoid activities that make your sciatica worse.  Take short rests during the day. Rest in a lying or standing position. This is usually better than sitting to rest. ? When you rest for a long time, do some physical activity or stretching between periods of rest. ? Avoid sitting for a long time without moving. Get up and move around at least one time each hour.  Exercise and stretch regularly, as told by your doctor.  Do not lift anything that is heavier than 10 lb (4.5 kg) while you have symptoms of sciatica. ? Avoid lifting heavy things even when you do not have symptoms. ? Avoid lifting heavy things over and over.  When you lift objects, always lift in a way that is safe for your body. To do this, you should: ? Bend your knees. ? Keep the object close to your body. ? Avoid twisting. General instructions  Use good posture. ? Avoid leaning forward when you are sitting. ? Avoid hunching over when you are standing.  Stay at a healthy weight.  Wear comfortable shoes that support your feet. Avoid wearing high heels.  Avoid sleeping on a mattress that is too soft or too hard. You might have less pain if you sleep on a mattress that is firm enough to support your back.  Keep all follow-up visits as told by your doctor. This is important. Contact a doctor if:  You have pain that: ? Wakes you up when you are sleeping. ? Gets worse when you lie down. ? Is worse than the pain you have had in the past. ? Lasts longer than 4 weeks.  You lose weight for without trying. Get help right away if:  You cannot control when you pee (urinate) or poop (have a bowel movement).  You have weakness in any of these areas and it gets worse. ? Lower back. ? Lower belly (pelvis). ? Butt (buttocks). ? Legs.  You  have redness or swelling of your back.  You have a burning feeling when you pee. This information is not intended to replace advice given to you by your health care provider. Make sure you discuss any questions you have with your health care provider. Document Released: 03/11/2008 Document Revised: 11/08/2015 Document Reviewed: 02/09/2015 Elsevier Interactive Patient Education  [employer].

## 2018-03-02 ENCOUNTER — Ambulatory Visit: Payer: BLUE CROSS/BLUE SHIELD | Admitting: Sports Medicine

## 2018-05-12 ENCOUNTER — Ambulatory Visit: Payer: Self-pay | Admitting: *Deleted

## 2018-05-12 NOTE — Telephone Encounter (Signed)
Patient is reporting intermittent left-sided CP he describes as a grinding feeling. Stated it occurs when he stands up from leaning over. It is a sharp pain that does not radiate and last a few seconds and resolves on its own. He began noticing these pain for about a year now but is occurring more often, once a day for over a week. Denies SOB/Dizziness/Fever. He has not tried any medications for this pain. Reviewed advice care per protocol re symptoms that would warrant an emergency evaluation. Patient stated he has increased caffeine coffee drinking along with meats over the last couple of weeks. Discussed decreasing these items and try OTC antacids. Appointment scheduled for 05/20/18 with Newell CoralE. Poulose, NP. No availability with PCP.   Reason for Disposition . [1] Chest pain(s) lasting a few seconds AND [2] persists > 3 days  Answer Assessment - Initial Assessment Questions 1. LOCATION: "Where does it hurt?"       Left sided chest pain sharp stabbing brief pain. 2. RADIATION: "Does the pain go anywhere else?" (e.g., into neck, jaw, arms, back)     no 3. ONSET: "When did the chest pain begin?" (Minutes, hours or days)      It has been on and off for maybe a year. Last week it began to be more regular like once a day. 4. PATTERN "Does the pain come and go, or has it been constant since it started?"  "Does it get worse with exertion?"      Comes and goes about once a day for 1-2 weeks now,mostly when he bends over and starts to come up. 5. DURATION: "How long does it last" (e.g., seconds, minutes, hours)     Last a few seconds and then resolves. 6. SEVERITY: "How bad is the pain?"  (e.g., Scale 1-10; mild, moderate, or severe)    - MILD (1-3): doesn't interfere with normal activities     - MODERATE (4-7): interferes with normal activities or awakens from sleep    - SEVERE (8-10): excruciating pain, unable to do any normal activities       moderate 7. CARDIAC RISK FACTORS: "Do you have any history of  heart problems or risk factors for heart disease?" (e.g., prior heart attack, angina; high blood pressure, diabetes, being overweight, high cholesterol, smoking, or strong family history of heart disease)     Overweight. 8. PULMONARY RISK FACTORS: "Do you have any history of lung disease?"  (e.g., blood clots in lung, asthma, emphysema, birth control pills)     no 9. CAUSE: "What do you think is causing the chest pain?"     No idea 10. OTHER SYMPTOMS: "Do you have any other symptoms?" (e.g., dizziness, nausea, vomiting, sweating, fever, difficulty breathing, cough)       When the pain hits, deep breath hurts 11. PREGNANCY: "Is there any chance you are pregnant?" "When was your last menstrual period?"       no  Protocols used: CHEST PAIN-A-AH

## 2018-05-18 ENCOUNTER — Ambulatory Visit: Payer: BLUE CROSS/BLUE SHIELD | Admitting: Nurse Practitioner

## 2018-05-18 ENCOUNTER — Encounter: Payer: Self-pay | Admitting: Nurse Practitioner

## 2018-05-18 VITALS — BP 128/84 | HR 80 | Temp 97.2°F | Resp 16 | Ht 70.0 in | Wt 254.7 lb

## 2018-05-18 DIAGNOSIS — R079 Chest pain, unspecified: Secondary | ICD-10-CM

## 2018-05-18 DIAGNOSIS — R7301 Impaired fasting glucose: Secondary | ICD-10-CM

## 2018-05-18 DIAGNOSIS — E7800 Pure hypercholesterolemia, unspecified: Secondary | ICD-10-CM

## 2018-05-18 NOTE — Progress Notes (Signed)
Name: Jeffrey Rivers   MRN: [id number]    DOB: 08-19-1970   Date:05/18/2018       Progress Note  Subjective  Chief Complaint  Chief Complaint  Patient presents with  . Chest Pain    left sided chest pain when bending over.     HPI  Patient endorses intermittent left sided sharp chest pain started 2 weeks ago; lasts less then 10 seconds. States has been having it about every 3 days. Last episode was on Wednesday. States started taking daily aspirin and hasn't had any problems since then.   States in the past has had similar pain that just self resolves. States pain typically happens after he eats or bending. Not worse with breathing. Non-radiating.    The 10-year ASCVD risk score Denman George([place]off [place] Montez HagemanJr., et al., 2013) is: 6.5%   Values used to calculate the score:     Age: 47 years     Sex: Male     Is Non-Hispanic African American: Yes     Diabetic: No     Tobacco smoker: No     Systolic Blood Pressure: 128 mmHg     Is BP treated: Yes     HDL Cholesterol: 53 mg/dL     Total Cholesterol: 177 mg/dL   Patient Active Problem List   Diagnosis Date Noted  . Obesity 03/22/2016  . Hyperlipidemia LDL goal <100 03/17/2016  . BP (high blood pressure) 12/19/2015  . Hesitancy 12/19/2015  . Benign prostatic hyperplasia with urinary obstruction 11/29/2015  . Essential hypertension, benign 11/05/2015  . LVH (left ventricular hypertrophy) 11/05/2015  . Medication monitoring encounter 11/05/2015  . Prostate cancer screening 11/05/2015    Past Medical History:  Diagnosis Date  . Essential hypertension, benign 11/05/2015  . Hypertension     Past Surgical History:  Procedure Laterality Date  . APPENDECTOMY    . HERNIA REPAIR Right 06/05/14    Social History   Tobacco Use  . Smoking status: Never Smoker  . Smokeless tobacco: Never Used  Substance Use Topics  . Alcohol use: No     Current Outpatient Medications:  .  amLODipine (NORVASC) 10 MG tablet, Take 1 tablet (10 mg total) by  mouth daily., Disp: 90 tablet, Rfl: 3 .  tamsulosin (FLOMAX) 0.4 MG CAPS capsule, Take 1 capsule (0.4 mg total) by mouth daily., Disp: 30 capsule, Rfl: 11 .  predniSONE (STERAPRED UNI-PAK 48 TAB) 5 MG (48) TBPK tablet, Take as directed, Disp: 48 tablet, Rfl: 0 .  tiZANidine (ZANAFLEX) 4 MG tablet, Take 0.5-1 tablets (2-4 mg total) by mouth every 8 (eight) hours as needed for muscle spasms. (Patient not taking: Reported on 05/18/2018), Disp: 30 tablet, Rfl: 0  Current Facility-Administered Medications:  .  triamcinolone acetonide (KENALOG) 10 MG/ML injection 10 mg, 10 mg, Other, Once, Stover, Titorya, DPM  No Known Allergies  ROS   No other specific complaints in a complete review of systems (except as listed in HPI above).  Objective  Vitals:   05/18/18 1547  BP: 128/84  Pulse: 80  Resp: 16  Temp: (!) 97.2 F (36.2 C)  TempSrc: Oral  SpO2: 98%  Weight: 254 lb 11.2 oz (115.5 kg)  Height: 5\' 10"  (1.778 m)    Body mass index is 36.55 kg/m.  Nursing Note and Vital Signs reviewed.  Physical Exam  Constitutional: He is oriented to person, place, and time. He appears well-developed and well-nourished.  HENT:  Head: Normocephalic and atraumatic.  Neck: Normal range of motion.  Neck supple. No JVD present. Carotid bruit is not present.  Cardiovascular: Normal rate, regular rhythm, normal heart sounds and intact distal pulses. Exam reveals no S3 and no S4.  No murmur heard. Pulses:      Carotid pulses are 3+ on the right side, and 2+ on the left side.      Radial pulses are 2+ on the right side, and 2+ on the left side.  Pulmonary/Chest: Effort normal and breath sounds normal. No accessory muscle usage. No respiratory distress.  Abdominal: Soft. Normal aorta and bowel sounds are normal. There is no tenderness. There is no CVA tenderness.  Musculoskeletal: Normal range of motion.  Neurological: He is alert and oriented to person, place, and time. He has normal strength. No sensory  deficit. GCS eye subscore is 4. GCS verbal subscore is 5. GCS motor subscore is 6.  Skin: Skin is warm, dry and intact. Capillary refill takes less than 2 seconds. No rash noted.  Psychiatric: He has a normal mood and affect. His speech is normal and behavior is normal. Judgment and thought content normal.  Vitals reviewed.      No results found for this or any previous visit (from the past 48 hour(s)).  Assessment & Plan  1. Chest pain, unspecified type Discussed possibilities, musculoskeletal vs GERD more likely; discussed ER precautions and s&s of heart attack; symptoms appear to have resolved. Discussed ASCVD risk score- aspirin not recommended a this time; no family history of heart attacks or stroke- will complete fasting blood work ordered in march tomorrow and re-evaluate. Will come in later this week for physical to discuss weight loss options.  - COMPLETE METABOLIC PANEL WITH GFR - CBC with Differential  2. Elevated LDL cholesterol level - Lipid Profile  3. Elevated fasting blood sugar - HgB A1c   -Red flags and when to present for emergency care or RTC including  chest pain, shortness of breath, new/worsening/un-resolving symptoms, reviewed with patient at time of visit. Follow up and care instructions discussed and provided in AVS.

## 2018-05-18 NOTE — Patient Instructions (Addendum)
- Come in for fasting labs tomorrow - When having chest pain episodes note- when you have them, how long they last, if the pain goes anywhere, if you have any associated symptoms (examples: shortness of breath, nausea, ect.), what you did before (eat, bending, lift, deep breath ect.) and if anything makes it better or worse.    Nonspecific Chest Pain Chest pain can be caused by many different conditions. There is a chance that your pain could be related to something serious, such as a heart attack or a blood clot in your lungs. Chest pain can also be caused by conditions that are not life-threatening. If you have chest pain, it is very important to follow up with your doctor. Follow these instructions at home: Medicines  If you were prescribed an antibiotic medicine, take it as told by your doctor. Do not stop taking the antibiotic even if you start to feel better.  Take over-the-counter and prescription medicines only as told by your doctor. Lifestyle  Do not use any products that contain nicotine or tobacco, such as cigarettes and e-cigarettes. If you need help quitting, ask your doctor.  Do not drink alcohol.  Make lifestyle changes as told by your doctor. These may include: ? Getting regular exercise. Ask your doctor for some activities that are safe for you. ? Eating a heart-healthy diet. A diet specialist (dietitian) can help you to learn healthy eating options. ? Staying at a healthy weight. ? Managing diabetes, if needed. ? Lowering your stress, as with deep breathing or spending time in nature. General instructions  Avoid any activities that make you feel chest pain.  If your chest pain is because of heartburn: ? Raise (elevate) the head of your bed about 6 inches (15 cm). You can do this by putting blocks under the bed legs at the head of the bed. ? Do not sleep with extra pillows under your head. That does not help heartburn.  Keep all follow-up visits as told by your  doctor. This is important. This includes any further testing if your chest pain does not go away. Contact a doctor if:  Your chest pain does not go away.  You have a rash with blisters on your chest.  You have a fever.  You have chills. Get help right away if:  Your chest pain is worse.  You have a cough that gets worse, or you cough up blood.  You have very bad (severe) pain in your belly (abdomen).  You are very weak.  You pass out (faint).  You have either of these for no clear reason: ? Sudden chest discomfort. ? Sudden discomfort in your arms, back, neck, or jaw.  You have shortness of breath at any time.  You suddenly start to sweat, or your skin gets clammy.  You feel sick to your stomach (nauseous).  You throw up (vomit).  You suddenly feel light-headed or dizzy.  Your heart starts to beat fast, or it feels like it is skipping beats. These symptoms may be an emergency. Do not wait to see if the symptoms will go away. Get medical help right away. Call your local emergency services (911 in the [place] Do not drive yourself to the hospital. This information is not intended to replace advice given to you by your health care provider. Make sure you discuss any questions you have with your health care provider. Document Released: 11/19/2007 Document Revised: 02/25/2016 Document Reviewed: 02/25/2016 Elsevier Interactive Patient Education  2017 [employer]

## 2018-05-19 DIAGNOSIS — E7800 Pure hypercholesterolemia, unspecified: Secondary | ICD-10-CM | POA: Diagnosis not present

## 2018-05-19 DIAGNOSIS — R7301 Impaired fasting glucose: Secondary | ICD-10-CM | POA: Diagnosis not present

## 2018-05-19 DIAGNOSIS — R079 Chest pain, unspecified: Secondary | ICD-10-CM | POA: Diagnosis not present

## 2018-05-20 LAB — COMPLETE METABOLIC PANEL WITH GFR
AG Ratio: 2.1 (calc) (ref 1.0–2.5)
ALT: 37 U/L (ref 9–46)
AST: 27 U/L (ref 10–40)
Albumin: 4.6 g/dL (ref 3.6–5.1)
Alkaline phosphatase (APISO): 43 U/L (ref 40–115)
BUN: 14 mg/dL (ref 7–25)
CO2: 30 mmol/L (ref 20–32)
Calcium: 9.4 mg/dL (ref 8.6–10.3)
Chloride: 102 mmol/L (ref 98–110)
Creat: 1 mg/dL (ref 0.60–1.35)
GFR, Est African American: 105 mL/min/{1.73_m2} (ref >=60)
GFR, Est Non African American: 90 mL/min/{1.73_m2} (ref >=60)
Globulin: 2.2 g/dL (calc) (ref 1.9–3.7)
Glucose, Bld: 88 mg/dL (ref 65–99)
Potassium: 4.6 mmol/L (ref 3.5–5.3)
Sodium: 139 mmol/L (ref 135–146)
Total Bilirubin: 0.4 mg/dL (ref 0.2–1.2)
Total Protein: 6.8 g/dL (ref 6.1–8.1)

## 2018-05-20 LAB — HEMOGLOBIN A1C
Hgb A1c MFr Bld: 5.9 % of total Hgb — ABNORMAL HIGH (ref <5.7)
Mean Plasma Glucose: 123 (calc)
eAG (mmol/L): 6.8 (calc)

## 2018-05-20 LAB — CBC WITH DIFFERENTIAL/PLATELET
Basophils Absolute: 11 cells/uL (ref 0–200)
Basophils Relative: 0.3 %
Eosinophils Absolute: 40 cells/uL (ref 15–500)
Eosinophils Relative: 1.1 %
HCT: 40.2 % (ref 38.5–50.0)
Hemoglobin: 13.8 g/dL (ref 13.2–17.1)
Lymphs Abs: 1825 cells/uL (ref 850–3900)
MCH: 27.2 pg (ref 27.0–33.0)
MCHC: 34.3 g/dL (ref 32.0–36.0)
MCV: 79.3 fL — ABNORMAL LOW (ref 80.0–100.0)
MPV: 11.2 fL (ref 7.5–12.5)
Monocytes Relative: 6.7 %
Neutro Abs: 1483 cells/uL — ABNORMAL LOW (ref 1500–7800)
Neutrophils Relative %: 41.2 %
Platelets: 217 10*3/uL (ref 140–400)
RBC: 5.07 10*6/uL (ref 4.20–5.80)
RDW: 13 % (ref 11.0–15.0)
Total Lymphocyte: 50.7 %
WBC mixed population: 241 cells/uL (ref 200–950)
WBC: 3.6 10*3/uL — ABNORMAL LOW (ref 3.8–10.8)

## 2018-05-20 LAB — LIPID PANEL
Cholesterol: 198 mg/dL (ref <200)
HDL: 60 mg/dL (ref >40)
LDL Cholesterol (Calc): 121 mg/dL (calc) — ABNORMAL HIGH
Non-HDL Cholesterol (Calc): 138 mg/dL (calc) — ABNORMAL HIGH (ref <130)
Total CHOL/HDL Ratio: 3.3 (calc) (ref <5.0)
Triglycerides: 71 mg/dL (ref <150)

## 2018-05-21 ENCOUNTER — Other Ambulatory Visit: Payer: Self-pay | Admitting: Nurse Practitioner

## 2018-05-21 ENCOUNTER — Encounter: Payer: Self-pay | Admitting: Nurse Practitioner

## 2018-05-21 DIAGNOSIS — R7303 Prediabetes: Secondary | ICD-10-CM

## 2018-05-21 DIAGNOSIS — E669 Obesity, unspecified: Secondary | ICD-10-CM

## 2018-05-21 MED ORDER — SEMAGLUTIDE(0.25 OR 0.5MG/DOS) 2 MG/1.5ML ~~LOC~~ SOPN: 0.2500 mg | PEN_INJECTOR | SUBCUTANEOUS | 2 refills | Status: DC

## 2018-06-08 ENCOUNTER — Encounter: Payer: BLUE CROSS/BLUE SHIELD | Admitting: Family Medicine

## 2018-07-09 ENCOUNTER — Encounter: Payer: Self-pay | Admitting: Family Medicine

## 2018-07-09 ENCOUNTER — Ambulatory Visit (INDEPENDENT_AMBULATORY_CARE_PROVIDER_SITE_OTHER): Payer: BLUE CROSS/BLUE SHIELD | Admitting: Family Medicine

## 2018-07-09 VITALS — BP 128/84 | HR 89 | Temp 100.5°F | Resp 18 | Ht 70.0 in | Wt 248.0 lb

## 2018-07-09 DIAGNOSIS — J101 Influenza due to other identified influenza virus with other respiratory manifestations: Secondary | ICD-10-CM

## 2018-07-09 DIAGNOSIS — R509 Fever, unspecified: Secondary | ICD-10-CM

## 2018-07-09 LAB — POCT INFLUENZA A/B
Influenza A, POC: POSITIVE — AB
Influenza B, POC: NEGATIVE

## 2018-07-09 MED ORDER — GUAIFENESIN ER 600 MG PO TB12: 600.0000 mg | ORAL_TABLET | Freq: Two times a day (BID) | ORAL | 0 refills | Status: DC | PRN

## 2018-07-09 MED ORDER — OSELTAMIVIR PHOSPHATE 75 MG PO CAPS: 75.0000 mg | ORAL_CAPSULE | Freq: Two times a day (BID) | ORAL | 0 refills | Status: AC

## 2018-07-09 NOTE — Progress Notes (Signed)
Name: Jeffrey Rivers   MRN: [id number]    DOB: 08/16/1970   Date:07/09/2018       Progress Note  Subjective  Chief Complaint  Chief Complaint  Patient presents with  . Fever    103 on yesterday, cough, headache, chills    HPI  Pt presents with 24 hours of fever (103.1 at home), fatigue/weakness, cough, body aches, and decrease in appetite. He has tried tylenol cold and flu without relief. He is positive for Flu A today in the office. Denies vomiting, chest pain, or shortness of breath.  Patient Active Problem List   Diagnosis Date Noted  . Prediabetes 05/21/2018  . Obesity 03/22/2016  . Hyperlipidemia LDL goal <100 03/17/2016  . BP (high blood pressure) 12/19/2015  . Hesitancy 12/19/2015  . Benign prostatic hyperplasia with urinary obstruction 11/29/2015  . Essential hypertension, benign 11/05/2015  . LVH (left ventricular hypertrophy) 11/05/2015  . Medication monitoring encounter 11/05/2015  . Prostate cancer screening 11/05/2015    Social History   Tobacco Use  . Smoking status: Never Smoker  . Smokeless tobacco: Never Used  Substance Use Topics  . Alcohol use: No     Current Outpatient Medications:  .  amLODipine (NORVASC) 10 MG tablet, Take 1 tablet (10 mg total) by mouth daily., Disp: 90 tablet, Rfl: 3 .  Semaglutide,0.25 or 0.5MG /DOS, (OZEMPIC, 0.25 OR 0.5 MG/DOSE,) 2 MG/1.5ML SOPN, Inject 0.25 mg into the skin once a week., Disp: 1 pen, Rfl: 2 .  tamsulosin (FLOMAX) 0.4 MG CAPS capsule, Take 1 capsule (0.4 mg total) by mouth daily., Disp: 30 capsule, Rfl: 11  Current Facility-Administered Medications:  .  triamcinolone acetonide (KENALOG) 10 MG/ML injection 10 mg, 10 mg, Other, Once, Marylene Land, Titorya, DPM  No Known Allergies  I personally reviewed active problem list, medication list, allergies, lab results with the patient/caregiver today.  ROS  Ten systems reviewed and is negative except as mentioned in HPI  Objective  Vitals:   07/09/18 0838    BP: 128/84  Pulse: 89  Resp: 18  Temp: (!) 100.5 F (38.1 C)  TempSrc: Oral  SpO2: 95%  Weight: 248 lb (112.5 kg)  Height: 5\' 10"  (1.778 m)   Body mass index is 35.58 kg/m.  Nursing Note and Vital Signs reviewed.  Physical Exam  Constitutional: Patient appears well-developed and well-nourished. Obese. No distress.  HEENT: head atraumatic, normocephalic, pupils equal and reactive to light, Bilateral TM's without erythema or effusion,  bilateral maxillary and frontal sinuses are non-tender, neck supple without lymphadenopathy, throat within normal limits - no erythema or exudate, no tonsillar swelling Cardiovascular: Normal rate, regular rhythm and normal heart sounds.  No murmur heard. No BLE edema. Pulmonary/Chest: Effort normal and breath sounds clear bilaterally. No respiratory distress. Congested cough is present. Psychiatric: Patient has a normal mood and affect. behavior is normal. Judgment and thought content normal.  Results for orders placed or performed in visit on 07/09/18 (from the past 72 hour(s))  POCT Influenza A/B     Status: Abnormal   Collection Time: 07/09/18  8:43 AM  Result Value Ref Range   Influenza A, POC Positive (A) Negative   Influenza B, POC Negative Negative    Assessment & Plan  1. Influenza A - oseltamivir (TAMIFLU) 75 MG capsule; Take 1 capsule (75 mg total) by mouth 2 (two) times daily for 5 days.  Dispense: 10 capsule; Refill: 0 - guaiFENesin (MUCINEX) 600 MG 12 hr tablet; Take 1 tablet (600 mg total) by mouth  2 (two) times daily as needed for cough or to loosen phlegm.  Dispense: 20 tablet; Refill: 0 - See AVS for teaching provided to patient.   2. Fever and chills - POCT Influenza A/B  -Red flags and when to present for emergency care or RTC including fever >101.74F, chest pain, shortness of breath, new/worsening/un-resolving symptoms, reviewed with patient at time of visit. Follow up and care instructions discussed and provided in  AVS.

## 2018-07-09 NOTE — Patient Instructions (Addendum)
Alternate every 4 hours taking: 625-1000mg  Tylenol (Acetaminophen) 400-600mg  Advil (Ibuprofen/Motrin)  Take Twice Daily: Tamiflu Mucinex  Push fluids as much as possible.  Cool Mist Vaporizer A cool mist vaporizer is a device that releases a cool mist into the air. If you have a cough or a cold, using a vaporizer may help relieve your symptoms. The mist adds moisture to the air, which may help thin your mucus and make it less sticky. When your mucus is thin and less sticky, it easier for you to breathe and to cough up secretions. Do not use a vaporizer if you are allergic to mold. Follow these instructions at home:  Follow the instructions that come with the vaporizer.  Do not use anything other than distilled water in the vaporizer.  Do not run the vaporizer all of the time. Doing that can cause mold or bacteria to grow in the vaporizer.  Clean the vaporizer after each time that you use it.  Clean and dry the vaporizer well before storing it.  Stop using the vaporizer if your breathing symptoms get worse. This information is not intended to replace advice given to you by your health care provider. Make sure you discuss any questions you have with your health care provider. Document Released: 02/28/2004 Document Revised: 12/21/2015 Document Reviewed: 09/01/2015 Elsevier Interactive Patient Education  2019 [employer] Inc[employer]   Influenza, Adult Influenza is also called "the flu." It is an infection in the lungs, nose, and throat (respiratory tract). It is caused by a virus. The flu causes symptoms that are similar to symptoms of a cold. It also causes a high fever and body aches. The flu spreads easily from person to person (is contagious). Getting a flu shot (influenza vaccination) every year is the best way to prevent the flu. What are the causes? This condition is caused by the influenza virus. You can get the virus by:  Breathing in droplets that are in the air from the cough or  sneeze of a person who has the virus.  Touching something that has the virus on it (is contaminated) and then touching your mouth, nose, or eyes. What increases the risk? Certain things may make you more likely to get the flu. These include:  Not washing your hands often.  Having close contact with many people during cold and flu season.  Touching your mouth, eyes, or nose without first washing your hands.  Not getting a flu shot every year. You may have a higher risk for the flu, along with serious problems such as a lung infection (pneumonia), if you:  Are older than 65.  Are pregnant.  Have a weakened disease-fighting system (immune system) because of a disease or taking certain medicines.  Have a long-term (chronic) illness, such as: ? Heart, kidney, or lung disease. ? Diabetes. ? Asthma.  Have a liver disorder.  Are very overweight (morbidly obese).  Have anemia. This is a condition that affects your red blood cells. What are the signs or symptoms? Symptoms usually begin suddenly and last 4-14 days. They may include:  Fever and chills.  Headaches, body aches, or muscle aches.  Sore throat.  Cough.  Runny or stuffy (congested) nose.  Chest discomfort.  Not wanting to eat as much as normal (poor appetite).  Weakness or feeling tired (fatigue).  Dizziness.  Feeling sick to your stomach (nauseous) or throwing up (vomiting). How is this treated? If the flu is found early, you can be treated with medicine that can help  reduce how bad the illness is and how long it lasts (antiviral medicine). This may be given by mouth (orally) or through an IV tube. Taking care of yourself at home can help your symptoms get better. Your doctor may suggest:  Taking over-the-counter medicines.  Drinking plenty of fluids. The flu often goes away on its own. If you have very bad symptoms or other problems, you may be treated in a hospital. Follow these instructions at home:      Activity  Rest as needed. Get plenty of sleep.  Stay home from work or school as told by your doctor. ? Do not leave home until you do not have a fever for 24 hours without taking medicine. ? Leave home only to visit your doctor. Eating and drinking  Take an ORS (oral rehydration solution). This is a drink that is sold at pharmacies and stores.  Drink enough fluid to keep your pee (urine) pale yellow.  Drink clear fluids in small amounts as you are able. Clear fluids include: ? Water. ? Ice chips. ? Fruit juice that has water added (diluted fruit juice). ? Low-calorie sports drinks.  Eat bland, easy-to-digest foods in small amounts as you are able. These foods include: ? Bananas. ? Applesauce. ? Rice. ? Lean meats. ? Toast. ? Crackers.  Do not eat or drink: ? Fluids that have a lot of sugar or caffeine. ? Alcohol. ? Spicy or fatty foods. General instructions  Take over-the-counter and prescription medicines only as told by your doctor.  Use a cool mist humidifier to add moisture to the air in your home. This can make it easier for you to breathe.  Cover your mouth and nose when you cough or sneeze.  Wash your hands with soap and water often, especially after you cough or sneeze. If you cannot use soap and water, use alcohol-based hand sanitizer.  Keep all follow-up visits as told by your doctor. This is important. How is this prevented?   Get a flu shot every year. You may get the flu shot in late summer, fall, or winter. Ask your doctor when you should get your flu shot.  Avoid contact with people who are sick during fall and winter (cold and flu season). Contact a doctor if:  You get new symptoms.  You have: ? Chest pain. ? Watery poop (diarrhea). ? A fever.  Your cough gets worse.  You start to have more mucus.  You feel sick to your stomach.  You throw up. Get help right away if you:  Have shortness of breath.  Have trouble  breathing.  Have skin or nails that turn a bluish color.  Have very bad pain or stiffness in your neck.  Get a sudden headache.  Get sudden pain in your face or ear.  Cannot eat or drink without throwing up. Summary  Influenza ("the flu") is an infection in the lungs, nose, and throat. It is caused by a virus.  Take over-the-counter and prescription medicines only as told by your doctor.  Getting a flu shot every year is the best way to avoid getting the flu. This information is not intended to replace advice given to you by your health care provider. Make sure you discuss any questions you have with your health care provider. Document Released: 03/11/2008 Document Revised: 11/18/2017 Document Reviewed: 11/18/2017 Elsevier Interactive Patient Education  2019 [employer]

## 2018-07-13 ENCOUNTER — Encounter: Payer: Self-pay | Admitting: Emergency Medicine

## 2018-12-11 ENCOUNTER — Other Ambulatory Visit: Payer: Self-pay | Admitting: Family Medicine

## 2018-12-12 NOTE — Telephone Encounter (Signed)
Patient needs routine appointment within next 2 months

## 2018-12-13 NOTE — Telephone Encounter (Signed)
lvm to scheduled appt within next 2 months also informed that prescription has been sent to pharmacy

## 2019-01-31 ENCOUNTER — Ambulatory Visit: Payer: BLUE CROSS/BLUE SHIELD | Admitting: Urology

## 2019-01-31 ENCOUNTER — Encounter: Payer: Self-pay | Admitting: Urology

## 2019-02-14 ENCOUNTER — Encounter: Payer: Self-pay | Admitting: Urology

## 2019-02-14 ENCOUNTER — Ambulatory Visit: Payer: BC Managed Care – PPO | Admitting: Urology

## 2019-02-14 ENCOUNTER — Other Ambulatory Visit: Payer: Self-pay

## 2019-02-14 VITALS — BP 132/89 | HR 66 | Ht 70.0 in | Wt 262.0 lb

## 2019-02-14 DIAGNOSIS — R3912 Poor urinary stream: Secondary | ICD-10-CM

## 2019-02-14 DIAGNOSIS — R300 Dysuria: Secondary | ICD-10-CM | POA: Diagnosis not present

## 2019-02-14 LAB — MICROSCOPIC EXAMINATION
Bacteria, UA: NONE SEEN
Epithelial Cells (non renal): NONE SEEN /hpf (ref 0–10)
RBC: NONE SEEN /hpf (ref 0–2)

## 2019-02-14 LAB — URINALYSIS, COMPLETE
Bilirubin, UA: NEGATIVE
Glucose, UA: NEGATIVE
Ketones, UA: NEGATIVE
Leukocytes,UA: NEGATIVE
Nitrite, UA: NEGATIVE
Protein,UA: NEGATIVE
RBC, UA: NEGATIVE
Specific Gravity, UA: 1.025 (ref 1.005–1.030)
Urobilinogen, Ur: 0.2 mg/dL (ref 0.2–1.0)
pH, UA: 7 (ref 5.0–7.5)

## 2019-02-14 LAB — BLADDER SCAN AMB NON-IMAGING

## 2019-02-14 MED ORDER — TAMSULOSIN HCL 0.4 MG PO CAPS: 0.8000 mg | ORAL_CAPSULE | Freq: Every day | ORAL | 11 refills | Status: DC

## 2019-02-14 NOTE — Progress Notes (Signed)
02/14/2019 8:54 AM   Jeffrey Rivers [birth year] [id number]  Referring provider: Kerman PasseyLada, Melinda P, MD [address] [place] [id number]  Chief Complaint  Patient presents with  . Follow-up    HPI: 2017: Dr Signe ColtEsk: Patient returns with h/oBPH and symptoms of weak stream, frequency, urgency, nocturia, intermittent flow, hesitancy and straining to void. He's had symptoms for several months. He tried tamsulosin which improved his symptoms but gave him dizziness. He denies trouble getting or maintaining an erection. He is most bothered hesitancy and weak stream which follows an urge to void. His PVR was 0 ml.  PSA was 0.19 Oct 2015.   He tried alfuzosinbut it made him feel drowsy and gave him heart palpitations and he stopped. Of note it did improve his weak stream.  The patient had cystoscopy noting a prominent obstructing median lobe.  He was counseled about transurethral resection versus other procedures and was given a trial of Rapaflo  last I believe the patient is back on Flomax.  Flow is much better.  Nocturia grade 2.  Clinically no infections.  No blood in the urine  Today Patient's flow was getting slower.  He hesitates.  On times he stops and starts and does not feel 90.  Sometimes he sits  No history of bladder infections.  Getting up 3 times a night and is still on Flomax  Modifying factors: There are no other modifying factors  Associated signs and symptoms: There are no other associated signs and symptoms Aggravating and relieving factors: There are no other aggravating or relieving factors Severity: Moderate Duration: Persistent  40 g benign prostate bed based on body habitus gland is more difficult to feel.  Male genitalia normal      PMH: Past Medical History:  Diagnosis Date  . Essential hypertension, benign 11/05/2015  . Hypertension     Surgical History: Past Surgical History:  Procedure Laterality Date  . APPENDECTOMY    .  HERNIA REPAIR Right 06/05/14    Home Medications:  Allergies as of 02/14/2019   No Known Allergies     Medication List       Accurate as of February 14, 2019  8:54 AM. If you have any questions, ask your nurse or doctor.        STOP taking these medications   guaiFENesin 600 MG 12 hr tablet Commonly known as: Mucinex Stopped by: Martina SinnerScott A Saraann Enneking, MD     TAKE these medications   amLODipine 10 MG tablet Commonly known as: NORVASC TAKE 1 TABLET(10 MG) BY MOUTH DAILY   Semaglutide(0.25 or 0.5MG /DOS) 2 MG/1.5ML Sopn Commonly known as: Ozempic (0.25 or 0.5 MG/DOSE) Inject 0.25 mg into the skin once a week.   tamsulosin 0.4 MG Caps capsule Commonly known as: FLOMAX Take 1 capsule (0.4 mg total) by mouth daily.       Allergies: No Known Allergies  Family History: Family History  Problem Relation Age of Onset  . Cancer Father 1160       colon  . Prostate cancer Neg Hx   . Kidney cancer Neg Hx     Social History:  reports that he has never smoked. He has never used smokeless tobacco. He reports that he does not drink alcohol or use drugs.  ROS: UROLOGY Frequent Urination?: No Hard to postpone urination?: Yes Burning/pain with urination?: No Get up at night to urinate?: Yes Leakage of urine?: No Urine stream starts and stops?: Yes Trouble starting stream?: Yes  Do you have to strain to urinate?: Yes Blood in urine?: Yes Urinary tract infection?: No Sexually transmitted disease?: No Injury to kidneys or bladder?: No Painful intercourse?: No Weak stream?: Yes Erection problems?: No Penile pain?: No  Gastrointestinal Nausea?: No Vomiting?: No Indigestion/heartburn?: No Diarrhea?: No Constipation?: No  Constitutional Fever: No Night sweats?: No Weight loss?: No Fatigue?: No  Skin Skin rash/lesions?: No Itching?: No  Eyes Blurred vision?: No Double vision?: No  Ears/Nose/Throat Sore throat?: No Sinus problems?: No  Hematologic/Lymphatic  Swollen glands?: No Easy bruising?: No  Cardiovascular Leg swelling?: No Chest pain?: No  Respiratory Cough?: No Shortness of breath?: No  Endocrine Excessive thirst?: No  Musculoskeletal Back pain?: No Joint pain?: No  Neurological Headaches?: No Dizziness?: No  Psychologic Depression?: No Anxiety?: No  Physical Exam: BP 132/89   Pulse 66   Ht 5\' 10"  (1.778 m)   Wt 262 lb (118.8 kg)   BMI 37.59 kg/m   Constitutional:  Alert and oriented, No acute distress.   Laboratory Data: Lab Results  Component Value Date   WBC 3.6 (L) 05/19/2018   HGB 13.8 05/19/2018   HCT 40.2 05/19/2018   MCV 79.3 (L) 05/19/2018   PLT 217 05/19/2018    Lab Results  Component Value Date   CREATININE 1.00 05/19/2018    Lab Results  Component Value Date   PSA 0.4 02/02/2017    No results found for: TESTOSTERONE  Lab Results  Component Value Date   HGBA1C 5.9 (H) 05/19/2018    Urinalysis    Component Value Date/Time   APPEARANCEUR Clear 01/14/2016 1549   GLUCOSEU Negative 01/14/2016 1549   BILIRUBINUR Negative 01/14/2016 1549   PROTEINUR Negative 01/14/2016 1549   NITRITE Negative 01/14/2016 1549   LEUKOCYTESUR Negative 01/14/2016 1549    Pertinent Imaging:   Assessment & Plan: Patient has flow symptoms.  In 2017 he spoke to Dr. Junious Silk regarding transurethral resection of prostate and other procedures and watchful waiting.  His gland was not that large but he had what appeared to be an obstructing middle lobe.  The role of urodynamics in a young man with voiding dysfunction discussed.  Pathophysiology of slow flow discussed.  He understands I would not aggressively manage his nighttime frequency currently independent of his prostate since his flow symptoms are quite significant.  He want to try Flomax 0.8 mg a day and come back in a year.  He gets his PSA done by his primary care doctor.  His symptoms date back approximately 3 years which is young for benign prostatic  hyperplasia  There are no diagnoses linked to this encounter.  No follow-ups on file.  Reece Packer, MD  [facility] [address] [place], [place] [address] [phone]

## 2019-02-14 NOTE — Addendum Note (Signed)
Addended by: Verlene Mayer A on: 02/14/2019 09:24 AM   Modules accepted: Orders

## 2019-02-18 ENCOUNTER — Other Ambulatory Visit: Payer: Self-pay

## 2019-02-18 ENCOUNTER — Encounter: Payer: Self-pay | Admitting: Family Medicine

## 2019-02-18 ENCOUNTER — Ambulatory Visit: Payer: BLUE CROSS/BLUE SHIELD | Admitting: Family Medicine

## 2019-02-18 VITALS — BP 124/84 | HR 84 | Temp 96.9°F | Resp 14 | Ht 70.0 in | Wt 255.1 lb

## 2019-02-18 DIAGNOSIS — E785 Hyperlipidemia, unspecified: Secondary | ICD-10-CM | POA: Diagnosis not present

## 2019-02-18 DIAGNOSIS — Z23 Encounter for immunization: Secondary | ICD-10-CM

## 2019-02-18 DIAGNOSIS — E6609 Other obesity due to excess calories: Secondary | ICD-10-CM

## 2019-02-18 DIAGNOSIS — I10 Essential (primary) hypertension: Secondary | ICD-10-CM

## 2019-02-18 DIAGNOSIS — R6889 Other general symptoms and signs: Secondary | ICD-10-CM

## 2019-02-18 DIAGNOSIS — Z800 Family history of malignant neoplasm of digestive organs: Secondary | ICD-10-CM

## 2019-02-18 DIAGNOSIS — G4700 Insomnia, unspecified: Secondary | ICD-10-CM

## 2019-02-18 DIAGNOSIS — Z125 Encounter for screening for malignant neoplasm of prostate: Secondary | ICD-10-CM

## 2019-02-18 DIAGNOSIS — Z0000 Encounter for general adult medical examination without abnormal findings: Secondary | ICD-10-CM | POA: Diagnosis not present

## 2019-02-18 DIAGNOSIS — R7303 Prediabetes: Secondary | ICD-10-CM | POA: Diagnosis not present

## 2019-02-18 DIAGNOSIS — R7989 Other specified abnormal findings of blood chemistry: Secondary | ICD-10-CM

## 2019-02-18 DIAGNOSIS — Z6836 Body mass index (BMI) 36.0-36.9, adult: Secondary | ICD-10-CM

## 2019-02-18 DIAGNOSIS — Z1211 Encounter for screening for malignant neoplasm of colon: Secondary | ICD-10-CM

## 2019-02-18 DIAGNOSIS — G4733 Obstructive sleep apnea (adult) (pediatric): Secondary | ICD-10-CM

## 2019-02-18 DIAGNOSIS — R5383 Other fatigue: Secondary | ICD-10-CM

## 2019-02-18 DIAGNOSIS — E66812 Obesity, class 2: Secondary | ICD-10-CM

## 2019-02-18 NOTE — Progress Notes (Signed)
Patient: Jeffrey Rivers, Male    DOB: January 17, 1971, 48 y.o.   MRN: [id number] Patient, No Pcp Per Visit Date: 02/23/2019  Today's Provider: Danelle Berry, PA-C   Chief Complaint  Patient presents with  . Annual Exam   Subjective:   Annual physical exam:  Jeffrey Rivers is a 48 y.o. male who presents today for health maintenance and annual & complete physical exam.   He feels fairly well.   He reports exercising sometimes, he will walk, some running or lifting occasionally or playing basketball with son.  Diet generally described by pt as "so, so"  Has a lot of sweets, has a busy schedule, eats out a lot tries to keep a lot of vegetables in diet.  He reports he is sleeping - not good due to urination Energy down and wakes up a lot at night using the restroom.    USPSTF grade A and B recommendations - reviewed and addressed today  Depression:  Phq 9 completed today by patient, was reviewed by me with patient in the room, score is  negative  Depression screen [facility] 2/9 02/18/2019 07/09/2018 02/18/2018 08/21/2017 02/02/2017  Decreased Interest 0 0 0 0 0  Down, Depressed, Hopeless 0 0 0 0 0  PHQ - 2 Score 0 0 0 0 0  Altered sleeping 0 0 0 - -  Tired, decreased energy 0 0 0 - -  Change in appetite 0 0 0 - -  Feeling bad or failure about yourself  0 0 0 - -  Trouble concentrating 0 0 0 - -  Moving slowly or fidgety/restless 0 0 0 - -  Suicidal thoughts 0 0 0 - -  PHQ-9 Score 0 0 0 - -  Difficult doing work/chores Not difficult at all Not difficult at all Not difficult at all - -    Hep C Screening: not indicated  STD testing and prevention (HIV/chl/gon/syphilis):  None, HIV screen done in the past, one  Prostate cancer: N/A Lab Results  Component Value Date   PSA 0.4 02/02/2017  Just saw urology and had PSA repeated   Intimate partner violence:  None, denies  BPH - managed by specialist  Advanced Care Planning:  A voluntary discussion about advance care planning including  the explanation and discussion of advance directives.  Discussed health care proxy and Living will, and the patient was able to identify a health care proxy as wife, Clayvon Parlett.  Patient does have a living will at present time. If patient does have living will, I have requested they bring this to the clinic to be scanned in to their chart.   Skin cancer:  Not done in the past, no concerning spots/bumps/lesiosn  Colorectal cancer:  colonoscopy is Not indicated yet, discussed for age and race doing earlier, will check with insurance coverage  Lung cancer: N/A  Social History   Tobacco Use  . Smoking status: Never Smoker  . Smokeless tobacco: Never Used  Substance Use Topics  . Alcohol use: No     Alcohol screening:   Office Visit from 02/18/2019 in [facility]  AUDIT-C Score  0      AAA N/A  ECG:  Done in the past per pt  Blood pressure/Hypertension: BP Readings from Last 3 Encounters:  02/18/19 124/84  02/14/19 132/89  07/09/18 128/84   Weight/Obesity: Wt Readings from Last 3 Encounters:  02/18/19 255 lb 1.6 oz (115.7 kg)  02/14/19 262 lb (118.8 kg)  07/09/18  248 lb (112.5 kg)   BMI Readings from Last 3 Encounters:  02/18/19 36.60 kg/m  02/14/19 37.59 kg/m  07/09/18 35.58 kg/m    Lipids:  Lab Results  Component Value Date   CHOL 198 05/19/2018   CHOL 177 02/02/2017   CHOL 217 (H) 11/05/2015   Lab Results  Component Value Date   HDL 60 05/19/2018   HDL 53 02/02/2017   HDL 59 11/05/2015   Lab Results  Component Value Date   LDLCALC 121 (H) 05/19/2018   LDLCALC 113 (H) 02/02/2017   LDLCALC 146 (H) 11/05/2015   Lab Results  Component Value Date   TRIG 71 05/19/2018   TRIG 56 02/02/2017   TRIG 62 11/05/2015   Lab Results  Component Value Date   CHOLHDL 3.3 05/19/2018   CHOLHDL 3.3 02/02/2017   No results found for: LDLDIRECT Based on the results of lipid panel his/her cardiovascular risk factor ( using [place] Cohort )  in  the next 10 years is : The 10-year ASCVD risk score [name], et al., 2013) is: 6.4%   Values used to calculate the score:     Age: 67 years     Sex: Male     Is Non-Hispanic African American: Yes     Diabetic: No     Tobacco smoker: No     Systolic Blood Pressure: 124 mmHg     Is BP treated: Yes     HDL Cholesterol: 60 mg/dL     Total Cholesterol: 198 mg/dL Glucose:  Glucose  Date Value Ref Range Status  11/05/2015 95 65 - 99 mg/dL Final   Glucose, Bld  Date Value Ref Range Status  05/19/2018 88 65 - 99 mg/dL Final    Comment:    .            Fasting reference interval .   02/02/2017 101 (H) 65 - 99 mg/dL Final  16/03/9603 96 65 - 99 mg/dL Final    Social History      He  reports that he has never smoked. He has never used smokeless tobacco. He reports that he does not drink alcohol or use drugs.       Social History   Socioeconomic History  . Marital status: Married    Spouse name: Not on file  . Number of children: Not on file  . Years of education: Not on file  . Highest education level: Not on file  Occupational History  . Not on file  Social Needs  . Financial resource strain: Not on file  . Food insecurity    Worry: Not on file    Inability: Not on file  . Transportation needs    Medical: Not on file    Non-medical: Not on file  Tobacco Use  . Smoking status: Never Smoker  . Smokeless tobacco: Never Used  Substance and Sexual Activity  . Alcohol use: No  . Drug use: No  . Sexual activity: Not Currently  Lifestyle  . Physical activity    Days per week: Not on file    Minutes per session: Not on file  . Stress: Not on file  Relationships  . Social Musician on phone: Not on file    Gets together: Not on file    Attends religious service: Not on file    Active member of club or organization: Not on file    Attends meetings of clubs or organizations: Not on file  Relationship status: Not on file  Other Topics Concern  . Not on  file  Social History Narrative  . Not on file         Social History   Socioeconomic History  . Marital status: Married    Spouse name: Not on file  . Number of children: Not on file  . Years of education: Not on file  . Highest education level: Not on file  Occupational History  . Not on file  Social Needs  . Financial resource strain: Not on file  . Food insecurity    Worry: Not on file    Inability: Not on file  . Transportation needs    Medical: Not on file    Non-medical: Not on file  Tobacco Use  . Smoking status: Never Smoker  . Smokeless tobacco: Never Used  Substance and Sexual Activity  . Alcohol use: No  . Drug use: No  . Sexual activity: Not Currently  Lifestyle  . Physical activity    Days per week: Not on file    Minutes per session: Not on file  . Stress: Not on file  Relationships  . Social Musicianconnections    Talks on phone: Not on file    Gets together: Not on file    Attends religious service: Not on file    Active member of club or organization: Not on file    Attends meetings of clubs or organizations: Not on file    Relationship status: Not on file  . Intimate partner violence    Fear of current or ex partner: Not on file    Emotionally abused: Not on file    Physically abused: Not on file    Forced sexual activity: Not on file  Other Topics Concern  . Not on file  Social History Narrative  . Not on file    Family History        Family Status  Relation Name Status  . Mother  Alive  . Father  Deceased  . Neg Hx  (Not Specified)        His family history includes Cancer (age of onset: 7160) in his father. There is no history of Prostate cancer or Kidney cancer.       Family History  Problem Relation Age of Onset  . Cancer Father [id number]       colon  . Prostate cancer Neg Hx   . Kidney cancer Neg Hx     Patient Active Problem List   Diagnosis Date Noted  . Prediabetes 05/21/2018  . Obesity 03/22/2016  . Hyperlipidemia LDL goal <100  03/17/2016  . Hesitancy 12/19/2015  . Benign prostatic hyperplasia with urinary obstruction 11/29/2015  . Essential hypertension, benign 11/05/2015  . LVH (left ventricular hypertrophy) 11/05/2015    Past Surgical History:  Procedure Laterality Date  . APPENDECTOMY    . HERNIA REPAIR Right 06/05/14     Current Outpatient Medications:  .  amLODipine (NORVASC) 10 MG tablet, TAKE 1 TABLET(10 MG) BY MOUTH DAILY, Disp: 90 tablet, Rfl: 0 .  tamsulosin (FLOMAX) 0.4 MG CAPS capsule, Take 2 capsules (0.8 mg total) by mouth daily., Disp: 60 capsule, Rfl: 11  No Known Allergies  Patient Care Team: Patient, No Pcp Per as PCP - General (General Practice) Sankar, Janet BerlinSeeplaputhur G, MD (General Surgery) Dennison MascotMorrisey, [place], MD (Family Medicine)  I personally reviewed active problem list, medication list, allergies, family history, social history, health maintenance, notes from last encounter, lab results, imaging  with the patient/caregiver today.  Review of Systems  Constitutional: Positive for fatigue. Negative for activity change, appetite change, chills, diaphoresis, fever and unexpected weight change.  HENT: Negative.   Eyes: Negative.   Respiratory: Negative.  Negative for cough, chest tightness and shortness of breath.   Cardiovascular: Negative.  Negative for chest pain, palpitations and leg swelling.  Gastrointestinal: Negative.  Negative for abdominal pain, blood in stool, diarrhea, nausea and vomiting.  Endocrine: Positive for polyuria. Negative for cold intolerance, heat intolerance, polydipsia and polyphagia.  Genitourinary: Positive for difficulty urinating and frequency. Negative for decreased urine volume, hematuria, testicular pain and urgency.  Musculoskeletal: Negative.   Skin: Negative.  Negative for color change and pallor.  Allergic/Immunologic: Negative.  Negative for immunocompromised state.  Neurological: Negative.  Negative for syncope, weakness, light-headedness and  numbness.  Psychiatric/Behavioral: Positive for sleep disturbance. Negative for confusion, dysphoric mood, self-injury and suicidal ideas. The patient is not nervous/anxious.   All other systems reviewed and are negative.         Objective:   Vitals:  Vitals:   02/18/19 1456  BP: 124/84  Pulse: 84  Resp: 14  Temp: (!) 96.9 F (36.1 C)  SpO2: 99%  Weight: 255 lb 1.6 oz (115.7 kg)  Height: 5\' 10"  (1.778 m)    Body mass index is 36.6 kg/m.  Physical Exam Vitals signs and nursing note reviewed.  Constitutional:      General: He is not in acute distress.    Appearance: Normal appearance. He is well-developed. He is not ill-appearing, toxic-appearing or diaphoretic.  HENT:     Head: Normocephalic and atraumatic.     Jaw: No trismus.     Right Ear: Tympanic membrane, ear canal and external ear normal.     Left Ear: Tympanic membrane, ear canal and external ear normal.     Nose: Nose normal. No mucosal edema, congestion or rhinorrhea.     Right Sinus: No maxillary sinus tenderness or frontal sinus tenderness.     Left Sinus: No maxillary sinus tenderness or frontal sinus tenderness.     Mouth/Throat:     Mouth: Mucous membranes are moist.     Pharynx: Oropharynx is clear. Uvula midline. No oropharyngeal exudate, posterior oropharyngeal erythema or uvula swelling.  Eyes:     General: Lids are normal. No scleral icterus.       Right eye: No discharge.        Left eye: No discharge.     Conjunctiva/sclera: Conjunctivae normal.     Pupils: Pupils are equal, round, and reactive to light.  Neck:     Musculoskeletal: Normal range of motion and neck supple.     Thyroid: No thyroid mass, thyromegaly or thyroid tenderness.     Trachea: Trachea and phonation normal. No tracheal deviation.  Cardiovascular:     Rate and Rhythm: Normal rate and regular rhythm.     Pulses: Normal pulses.          Radial pulses are 2+ on the right side and 2+ on the left side.       Posterior tibial  pulses are 2+ on the right side and 2+ on the left side.     Heart sounds: Normal heart sounds. No murmur. No friction rub. No gallop.   Pulmonary:     Effort: Pulmonary effort is normal. No respiratory distress.     Breath sounds: Normal breath sounds. No stridor. No wheezing, rhonchi or rales.  Abdominal:     General:  Bowel sounds are normal. There is no distension.     Palpations: Abdomen is soft. There is no mass.     Tenderness: There is no abdominal tenderness. There is no guarding or rebound.     Hernia: No hernia is present.  Musculoskeletal: Normal range of motion.     Right lower leg: No edema.     Left lower leg: No edema.  Skin:    General: Skin is warm and dry.     Capillary Refill: Capillary refill takes less than 2 seconds.     Coloration: Skin is not ashen, cyanotic, jaundiced or pale.     Findings: No bruising or rash.     Nails: There is no clubbing.   Neurological:     Mental Status: He is alert and oriented to person, place, and time.     Motor: No weakness.     Coordination: Coordination normal.     Gait: Gait normal.  Psychiatric:        Mood and Affect: Mood normal.        Speech: Speech normal.        Behavior: Behavior normal.        Thought Content: Thought content normal.      Recent Results (from the past 2160 hour(s))  Urinalysis, Complete     Status: None   Collection Time: 02/14/19  8:41 AM  Result Value Ref Range   Specific Gravity, UA 1.025 1.005 - 1.030   pH, UA 7.0 5.0 - 7.5   Color, UA Yellow Yellow   Appearance Ur Clear Clear   Leukocytes,UA Negative Negative   Protein,UA Negative Negative/Trace   Glucose, UA Negative Negative   Ketones, UA Negative Negative   RBC, UA Negative Negative   Bilirubin, UA Negative Negative   Urobilinogen, Ur 0.2 0.2 - 1.0 mg/dL   Nitrite, UA Negative Negative   Microscopic Examination See below:   Microscopic Examination     Status: None   Collection Time: 02/14/19  8:41 AM   URINE  Result Value  Ref Range   WBC, UA 0-5 0 - 5 /hpf   RBC None seen 0 - 2 /hpf   Epithelial Cells (non renal) None seen 0 - 10 /hpf   Bacteria, UA None seen None seen/Few  Bladder Scan (Post Void Residual) in office     Status: None   Collection Time: 02/14/19  8:45 AM  Result Value Ref Range   Scan Result      PHQ2/9: Depression screen [facility] 2/9 02/18/2019 07/09/2018 02/18/2018 08/21/2017 02/02/2017  Decreased Interest 0 0 0 0 0  Down, Depressed, Hopeless 0 0 0 0 0  PHQ - 2 Score 0 0 0 0 0  Altered sleeping 0 0 0 - -  Tired, decreased energy 0 0 0 - -  Change in appetite 0 0 0 - -  Feeling bad or failure about yourself  0 0 0 - -  Trouble concentrating 0 0 0 - -  Moving slowly or fidgety/restless 0 0 0 - -  Suicidal thoughts 0 0 0 - -  PHQ-9 Score 0 0 0 - -  Difficult doing work/chores Not difficult at all Not difficult at all Not difficult at all - -    Fall Risk: Fall Risk  02/18/2019 07/09/2018 02/18/2018 12/25/2017 08/21/2017  Falls in the past year? 0 0 No No No  Number falls in past yr: 0 0 - - -  Injury with Fall? 0 0 - - -  Follow up - Falls evaluation completed - - -    Functional Status Survey: Is the patient deaf or have difficulty hearing?: No Does the patient have difficulty seeing, even when wearing glasses/contacts?: No Does the patient have difficulty concentrating, remembering, or making decisions?: No Does the patient have difficulty walking or climbing stairs?: No Does the patient have difficulty dressing or bathing?: No Does the patient have difficulty doing errands alone such as visiting a doctor's office or shopping?: No   Assessment & Plan:    CPE completed today  . Prostate cancer screening and PSA options (with potential risks and benefits of testing vs not testing) were discussed along with recent recs/guidelines, shared decision making and handout/information given to pt today  . USPSTF grade A and B recommendations reviewed with patient; age-appropriate  recommendations, preventive care, screening tests, etc discussed and encouraged; healthy living encouraged; see AVS for patient education given to patient  . Discussed importance of 150 minutes of physical activity weekly, AHA exercise recommendations given to pt in AVS/handout  . Discussed importance of healthy diet:  eating lean meats and proteins, avoiding trans fats and saturated fats, avoid simple sugars and excessive carbs in diet, eat 6 servings of fruit/vegetables daily and drink plenty of water and avoid sweet beverages.  DASH diet reviewed if pt has HTN  . Recommended pt to do annual eye exam and routine dental exams/cleanings  . Reviewed Health Maintenance: Health Maintenance  Topic Date Due  . TETANUS/TDAP  02/22/2022  . HIV Screening  Completed  . INFLUENZA VACCINE  Discontinued    . Immunizations: There is no immunization history for the selected administration types on file for this patient.  1. Encounter for general medical examination Done as noted above - COMPLETE METABOLIC PANEL WITH GFR - CBC with Differential/Platelet - Hemoglobin A1c - Lipid panel  2. Need for immunization against influenza Pt refused  3. Prediabetes Recheck, no signs of worsening except pt fatigued - Hemoglobin A1c  4. Hyperlipidemia LDL goal <100 Hx of HLD w/o current medical management - pt new to me, will recheck labs and recalculate 10 year risk with ASCVD calculator Healthy diet and exercise advised - COMPLETE METABOLIC PANEL WITH GFR - Lipid panel  5. Essential hypertension, benign Well controlled on current meds w/o SE - COMPLETE METABOLIC PANEL WITH GFR  6. Class 2 obesity due to excess calories without serious comorbidity with body mass index (BMI) of 36.0 to 36.9 in adult Healthy diet and exercise encouraged and calorie reduction for weightloss - Hemoglobin A1c - Lipid panel - TSH - T4, free  7. Encounter for prostate cancer screening PSA cancelled - pt had done with  urology  8. 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 Screening for colon cancer - Ambulatory referral to Gastroenterology  10. Family history of colon cancer in father - Ambulatory referral to Gastroenterology  11. OSA (obstructive sleep apnea) See sleep screening in chart - his fatigue and daytime sleepiness may be due to frequent nocturia, but also has hx of snoring and wife suspects OSA, pt says he was tested in the past but never had CPAP - referral for sleep studies - Ambulatory referral to Sleep Studies  12. Insomnia, unspecified type Secondary  to BPH LUTS and possible OSA?   - Ambulatory referral to Sleep Studies  13. Fatigue, unspecified type Insomnia, nocturia possibly causing fatigue.  Will r/o other causes - VITAMIN D 25 Hydroxy (Vit-D Deficiency, Fractures) - Vitamin B12 - TSH - T4, free  14. Other general symptoms and signs  - VITAMIN D 25 Hydroxy (Vit-D Deficiency, Fractures) - Vitamin B12 - TSH - T4, free   LUTS - severe - encouraged him to follow up with urology for no improvement in sx with increasing flomax dose - encouraged to take BP meds in the am and flomax and night before bedtime  Danelle BerryLeisa Keviana Guida, PA-C 02/18/19 3:01 PM  [facility]

## 2019-02-18 NOTE — Patient Instructions (Addendum)
Please return for your fasting labs - In addition to what is on here I will add some labs for your low energy   Health Maintenance, Male Adopting a healthy lifestyle and getting preventive care are important in promoting health and wellness. Ask your health care provider about:  The right schedule for you to have regular tests and exams.  Things you can do on your own to prevent diseases and keep yourself healthy. What should I know about diet, weight, and exercise? Eat a healthy diet   Eat a diet that includes plenty of vegetables, fruits, low-fat dairy products, and lean protein.  Do not eat a lot of foods that are high in solid fats, added sugars, or sodium. Maintain a healthy weight Body mass index (BMI) is a measurement that can be used to identify possible weight problems. It estimates body fat based on height and weight. Your health care provider can help determine your BMI and help you achieve or maintain a healthy weight. Get regular exercise Get regular exercise. This is one of the most important things you can do for your health. Most adults should:  Exercise for at least 150 minutes each week. The exercise should increase your heart rate and make you sweat (moderate-intensity exercise).  Do strengthening exercises at least twice a week. This is in addition to the moderate-intensity exercise.  Spend less time sitting. Even light physical activity can be beneficial. Watch cholesterol and blood lipids Have your blood tested for lipids and cholesterol at 46 years of age, then have this test every 5 years. You may need to have your cholesterol levels checked more often if:  Your lipid or cholesterol levels are high.  You are older than 46 years of age.  You are at high risk for heart disease. What should I know about cancer screening? Many types of cancers can be detected early and may often be prevented. Depending on your health history and family history, you may need to  have cancer screening at various ages. This may include screening for:  Colorectal cancer.  Prostate cancer.  Skin cancer.  Lung cancer. What should I know about heart disease, diabetes, and high blood pressure? Blood pressure and heart disease  High blood pressure causes heart disease and increases the risk of stroke. This is more likely to develop in people who have high blood pressure readings, are of African descent, or are overweight.  Talk with your health care provider about your target blood pressure readings.  Have your blood pressure checked: ? Every 3-5 years if you are 84-59 years of age. ? Every year if you are 3 years old or older.  If you are between the ages of 39 and 12 and are a current or former smoker, ask your health care provider if you should have a one-time screening for abdominal aortic aneurysm (AAA). Diabetes Have regular diabetes screenings. This checks your fasting blood sugar level. Have the screening done:  Once every three years after age 29 if you are at a normal weight and have a low risk for diabetes.  More often and at a younger age if you are overweight or have a high risk for diabetes. What should I know about preventing infection? Hepatitis B If you have a higher risk for hepatitis B, you should be screened for this virus. Talk with your health care provider to find out if you are at risk for hepatitis B infection. Hepatitis C Blood testing is recommended for:  Everyone  born from [birth year] through 1963.  Anyone with known risk factors for hepatitis C. Sexually transmitted infections (STIs)  You should be screened each year for STIs, including gonorrhea and chlamydia, if: ? You are sexually active and are younger than 46 years of age. ? You are older than 46 years of age and your health care provider tells you that you are at risk for this type of infection. ? Your sexual activity has changed since you were last screened, and you are at  increased risk for chlamydia or gonorrhea. Ask your health care provider if you are at risk.  Ask your health care provider about whether you are at high risk for HIV. Your health care provider may recommend a prescription medicine to help prevent HIV infection. If you choose to take medicine to prevent HIV, you should first get tested for HIV. You should then be tested every 3 months for as long as you are taking the medicine. Follow these instructions at home: Lifestyle  Do not use any products that contain nicotine or tobacco, such as cigarettes, e-cigarettes, and chewing tobacco. If you need help quitting, ask your health care provider.  Do not use street drugs.  Do not share needles.  Ask your health care provider for help if you need support or information about quitting drugs. Alcohol use  Do not drink alcohol if your health care provider tells you not to drink.  If you drink alcohol: ? Limit how much you have to 0-2 drinks a day. ? Be aware of how much alcohol is in your drink. In the [place] one drink equals one 12 oz bottle of beer (355 mL), one 5 oz glass of wine (148 mL), or one 1 oz glass of hard liquor (44 mL). General instructions  Schedule regular health, dental, and eye exams.  Stay current with your vaccines.  Tell your health care provider if: ? You often feel depressed. ? You have ever been abused or do not feel safe at home. Summary  Adopting a healthy lifestyle and getting preventive care are important in promoting health and wellness.  Follow your health care provider's instructions about healthy diet, exercising, and getting tested or screened for diseases.  Follow your health care provider's instructions on monitoring your cholesterol and blood pressure. This information is not intended to replace advice given to you by your health care provider. Make sure you discuss any questions you have with your health care provider. Document Released: 11/29/2007  Document Revised: 05/26/2018 Document Reviewed: 05/26/2018 Elsevier Patient Education  2020 [employer].  American Heart Association (AHA) Exercise Recommendation  Being physically active is important to prevent heart disease and stroke, the nation's No. 1and No. 5killers. To improve overall cardiovascular health, we suggest at least 150 minutes per week of moderate exercise or 75 minutes per week of vigorous exercise (or a combination of moderate and vigorous activity). Thirty minutes a day, five times a week is an easy goal to remember. You will also experience benefits even if you divide your time into two or three segments of 10 to 15 minutes per day.  For people who would benefit from lowering their blood pressure or cholesterol, we recommend 40 minutes of aerobic exercise of moderate to vigorous intensity three to four times a week to lower the risk for heart attack and stroke.  Physical activity is anything that makes you move your body and burn calories.  This includes things like climbing stairs or playing sports. Aerobic exercises benefit  your heart, and include walking, jogging, swimming or biking. Strength and stretching exercises are best for overall stamina and flexibility.  The simplest, positive change you can make to effectively improve your heart health is to start walking. It's enjoyable, free, easy, social and great exercise. A walking program is flexible and boasts high success rates because people can stick with it. It's easy for walking to become a regular and satisfying part of life.   For Overall Cardiovascular Health:  At least 30 minutes of moderate-intensity aerobic activity at least 5 days per week for a total of 150  OR   At least 25 minutes of vigorous aerobic activity at least 3 days per week for a total of 75 minutes; or a combination of moderate- and vigorous-intensity aerobic activity  AND   Moderate- to high-intensity muscle-strengthening activity at  least 2 days per week for additional health benefits.  For Lowering Blood Pressure and Cholesterol  An average 40 minutes of moderate- to vigorous-intensity aerobic activity 3 or 4 times per week  What if I can't make it to the time goal? Something is always better than nothing! And everyone has to start somewhere. Even if you've been sedentary for years, today is the day you can begin to make healthy changes in your life. If you don't think you'll make it for 30 or 40 minutes, set a reachable goal for today. You can work up toward your overall goal by increasing your time as you get stronger. Don't let all-or-nothing thinking rob you of doing what you can every day.  Source:http://www.heart.org

## 2019-02-25 DIAGNOSIS — R5383 Other fatigue: Secondary | ICD-10-CM | POA: Diagnosis not present

## 2019-02-25 DIAGNOSIS — E785 Hyperlipidemia, unspecified: Secondary | ICD-10-CM | POA: Diagnosis not present

## 2019-02-25 DIAGNOSIS — I10 Essential (primary) hypertension: Secondary | ICD-10-CM | POA: Diagnosis not present

## 2019-02-25 DIAGNOSIS — Z0000 Encounter for general adult medical examination without abnormal findings: Secondary | ICD-10-CM | POA: Diagnosis not present

## 2019-02-26 LAB — CBC WITH DIFFERENTIAL/PLATELET
Absolute Monocytes: 201 cells/uL (ref 200–950)
Basophils Absolute: 10 cells/uL (ref 0–200)
Basophils Relative: 0.3 %
Eosinophils Absolute: 40 cells/uL (ref 15–500)
Eosinophils Relative: 1.2 %
HCT: 40.9 % (ref 38.5–50.0)
Hemoglobin: 13.7 g/dL (ref 13.2–17.1)
Lymphs Abs: 1591 cells/uL (ref 850–3900)
MCH: 27.3 pg (ref 27.0–33.0)
MCHC: 33.5 g/dL (ref 32.0–36.0)
MCV: 81.6 fL (ref 80.0–100.0)
MPV: 11.5 fL (ref 7.5–12.5)
Monocytes Relative: 6.1 %
Neutro Abs: 1459 cells/uL — ABNORMAL LOW (ref 1500–7800)
Neutrophils Relative %: 44.2 %
Platelets: 203 10*3/uL (ref 140–400)
RBC: 5.01 10*6/uL (ref 4.20–5.80)
RDW: 13.1 % (ref 11.0–15.0)
Total Lymphocyte: 48.2 %
WBC: 3.3 10*3/uL — ABNORMAL LOW (ref 3.8–10.8)

## 2019-02-26 LAB — COMPLETE METABOLIC PANEL WITH GFR
AG Ratio: 2.1 (calc) (ref 1.0–2.5)
ALT: 34 U/L (ref 9–46)
AST: 26 U/L (ref 10–40)
Albumin: 4.6 g/dL (ref 3.6–5.1)
Alkaline phosphatase (APISO): 41 U/L (ref 36–130)
BUN: 11 mg/dL (ref 7–25)
CO2: 26 mmol/L (ref 20–32)
Calcium: 9.4 mg/dL (ref 8.6–10.3)
Chloride: 106 mmol/L (ref 98–110)
Creat: 1.01 mg/dL (ref 0.60–1.35)
GFR, Est African American: 103 mL/min/{1.73_m2} (ref >=60)
GFR, Est Non African American: 89 mL/min/{1.73_m2} (ref >=60)
Globulin: 2.2 g/dL (calc) (ref 1.9–3.7)
Glucose, Bld: 95 mg/dL (ref 65–99)
Potassium: 4.2 mmol/L (ref 3.5–5.3)
Sodium: 142 mmol/L (ref 135–146)
Total Bilirubin: 0.4 mg/dL (ref 0.2–1.2)
Total Protein: 6.8 g/dL (ref 6.1–8.1)

## 2019-02-26 LAB — T4, FREE: Free T4: 1.3 ng/dL (ref 0.8–1.8)

## 2019-02-26 LAB — LIPID PANEL
Cholesterol: 200 mg/dL — ABNORMAL HIGH (ref <200)
HDL: 54 mg/dL (ref >=40)
LDL Cholesterol (Calc): 131 mg/dL (calc) — ABNORMAL HIGH
Non-HDL Cholesterol (Calc): 146 mg/dL (calc) — ABNORMAL HIGH (ref <130)
Total CHOL/HDL Ratio: 3.7 (calc) (ref <5.0)
Triglycerides: 55 mg/dL (ref <150)

## 2019-02-26 LAB — HEMOGLOBIN A1C
Hgb A1c MFr Bld: 5.9 % of total Hgb — ABNORMAL HIGH (ref <5.7)
Mean Plasma Glucose: 123 (calc)
eAG (mmol/L): 6.8 (calc)

## 2019-02-26 LAB — VITAMIN B12: Vitamin B-12: 710 pg/mL (ref 200–1100)

## 2019-02-26 LAB — VITAMIN D 25 HYDROXY (VIT D DEFICIENCY, FRACTURES): Vit D, 25-Hydroxy: 26 ng/mL — ABNORMAL LOW (ref 30–100)

## 2019-02-26 LAB — TSH: TSH: 1.56 mIU/L (ref 0.40–4.50)

## 2019-03-17 ENCOUNTER — Encounter: Payer: Self-pay | Admitting: *Deleted

## 2019-03-21 ENCOUNTER — Other Ambulatory Visit: Payer: Self-pay

## 2019-03-21 ENCOUNTER — Ambulatory Visit: Payer: Self-pay

## 2019-03-21 ENCOUNTER — Telehealth: Payer: Self-pay | Admitting: Family Medicine

## 2019-03-21 VITALS — BP 162/88 | HR 64 | Resp 18 | Ht 70.0 in | Wt 262.0 lb

## 2019-03-21 DIAGNOSIS — I10 Essential (primary) hypertension: Secondary | ICD-10-CM

## 2019-03-21 DIAGNOSIS — Z008 Encounter for other general examination: Secondary | ICD-10-CM

## 2019-03-21 LAB — POCT LIPID PANEL
HDL: 51
LDL: 78
Non-HDL: 91
POC Glucose: 106 mg/dl — AB (ref 70–99)
TC/HDL: 2.8
TC: 142
TRG: 64

## 2019-03-21 MED ORDER — AMLODIPINE BESYLATE 10 MG PO TABS: 10.0000 mg | ORAL_TABLET | Freq: Every day | ORAL | 1 refills | Status: DC

## 2019-03-21 NOTE — Progress Notes (Signed)
     Patient ID: Jeffrey Rivers, male    DOB: Dec 16, 1970, 48 y.o.   MRN: [id number]    Thank you!!  [place] [facility] [place]: [phone]  Cell:  [phone] Website: [email or web address]

## 2019-03-21 NOTE — Telephone Encounter (Signed)
Medication Refill - Medication:  amLODipine (NORVASC) 10 MG tablet  Has the patient contacted their pharmacy? Yes advised to call office. Pt is completely out.   Preferred Pharmacy (with phone number or street name):  [id number] [address], [place] [phone] (Phone) [phone] (Fax)   Agent: Please be advised that RX refills may take up to 3 business days. We ask that you follow-up with your pharmacy.

## 2019-03-23 NOTE — Telephone Encounter (Signed)
lvm for scheduleing °

## 2019-07-05 ENCOUNTER — Ambulatory Visit: Payer: 59 | Attending: Internal Medicine

## 2019-07-05 DIAGNOSIS — Z20822 Contact with and (suspected) exposure to covid-19: Secondary | ICD-10-CM

## 2019-07-06 ENCOUNTER — Telehealth: Payer: Self-pay | Admitting: *Deleted

## 2019-07-06 LAB — NOVEL CORONAVIRUS, NAA: SARS-CoV-2, NAA: DETECTED — AB

## 2019-07-06 NOTE — Telephone Encounter (Signed)
Patient called ,results still pending. 

## 2019-07-07 ENCOUNTER — Telehealth: Payer: Self-pay | Admitting: Unknown Physician Specialty

## 2019-07-07 NOTE — Telephone Encounter (Signed)
Called to discuss with patient about Covid symptoms and the use of bamlanivimab, a monoclonal antibody infusion for those with mild to moderate Covid symptoms and at a high risk of hospitalization.  Pt is qualified for this infusion at the [facility] due to BMI>35   Message left to call back  

## 2019-10-21 ENCOUNTER — Telehealth: Payer: Self-pay

## 2019-10-21 NOTE — Telephone Encounter (Signed)
Appt made

## 2019-10-21 NOTE — Telephone Encounter (Signed)
Pt needs f/u for bp med refill

## 2019-10-24 ENCOUNTER — Ambulatory Visit: Payer: BC Managed Care – PPO | Admitting: Family Medicine

## 2019-10-24 ENCOUNTER — Encounter: Payer: Self-pay | Admitting: Family Medicine

## 2019-10-24 ENCOUNTER — Other Ambulatory Visit: Payer: Self-pay

## 2019-10-24 VITALS — BP 132/86 | HR 66 | Temp 97.9°F | Resp 14 | Ht 70.0 in | Wt 258.6 lb

## 2019-10-24 DIAGNOSIS — Z6837 Body mass index (BMI) 37.0-37.9, adult: Secondary | ICD-10-CM

## 2019-10-24 DIAGNOSIS — R7303 Prediabetes: Secondary | ICD-10-CM

## 2019-10-24 DIAGNOSIS — I10 Essential (primary) hypertension: Secondary | ICD-10-CM

## 2019-10-24 DIAGNOSIS — E785 Hyperlipidemia, unspecified: Secondary | ICD-10-CM | POA: Diagnosis not present

## 2019-10-24 LAB — COMPLETE METABOLIC PANEL WITH GFR
AG Ratio: 2.2 (calc) (ref 1.0–2.5)
ALT: 26 U/L (ref 9–46)
AST: 28 U/L (ref 10–40)
Albumin: 4.4 g/dL (ref 3.6–5.1)
Alkaline phosphatase (APISO): 39 U/L (ref 36–130)
BUN: 13 mg/dL (ref 7–25)
CO2: 29 mmol/L (ref 20–32)
Calcium: 9.2 mg/dL (ref 8.6–10.3)
Chloride: 105 mmol/L (ref 98–110)
Creat: 1.07 mg/dL (ref 0.60–1.35)
GFR, Est African American: 96 mL/min/{1.73_m2} (ref >=60)
GFR, Est Non African American: 83 mL/min/{1.73_m2} (ref >=60)
Globulin: 2 g/dL (calc) (ref 1.9–3.7)
Glucose, Bld: 97 mg/dL (ref 65–99)
Potassium: 4.4 mmol/L (ref 3.5–5.3)
Sodium: 140 mmol/L (ref 135–146)
Total Bilirubin: 0.5 mg/dL (ref 0.2–1.2)
Total Protein: 6.4 g/dL (ref 6.1–8.1)

## 2019-10-24 LAB — LIPID PANEL
Cholesterol: 165 mg/dL (ref <200)
HDL: 55 mg/dL (ref >=40)
LDL Cholesterol (Calc): 96 mg/dL (calc)
Non-HDL Cholesterol (Calc): 110 mg/dL (calc) (ref <130)
Total CHOL/HDL Ratio: 3 (calc) (ref <5.0)
Triglycerides: 55 mg/dL (ref <150)

## 2019-10-24 MED ORDER — AMLODIPINE BESYLATE 10 MG PO TABS: 10.0000 mg | ORAL_TABLET | Freq: Every day | ORAL | 3 refills | Status: DC

## 2019-10-24 MED ORDER — LISINOPRIL 10 MG PO TABS: 10.0000 mg | ORAL_TABLET | Freq: Every day | ORAL | 3 refills | Status: DC

## 2019-10-24 NOTE — Patient Instructions (Signed)
Preventing High Cholesterol  Cholesterol is a white, waxy substance similar to fat that the human body needs to help build cells. The liver makes all the cholesterol that a person's body needs. Having high cholesterol (hypercholesterolemia) increases a person's risk for heart disease and stroke. Extra (excess) cholesterol comes from the food the person eats. High cholesterol can often be prevented with diet and lifestyle changes. If you already have high cholesterol, you can control it with diet and lifestyle changes and with medicine. How can high cholesterol affect me? If you have high cholesterol, deposits (plaques) may build up on the walls of your arteries. The arteries are the blood vessels that carry blood away from your heart. Plaques make the arteries narrower and stiffer. This can limit or block blood flow and cause blood clots to form. Blood clots:  Are tiny balls of cells that form in your blood.  Can move to the heart or brain, causing a heart attack or stroke. Plaques in arteries greatly increase your risk for heart attack and stroke.Making diet and lifestyle changes can reduce your risk for these conditions that may threaten your life. What can increase my risk? This condition is more likely to develop in people who:  Eat foods that are high in saturated fat or cholesterol. Saturated fat is mostly found in: ? Foods that contain animal fat, such as red meat and some dairy products. ? Certain fatty foods made from plants, such as tropical oils.  Are overweight.  Are not getting enough exercise.  Have a family history of high cholesterol. What actions can I take to prevent this? Nutrition   Eat less saturated fat.  Avoid trans fats (partially hydrogenated oils). These are often found in margarine and in some baked goods, fried foods, and snacks bought in packages.  Avoid precooked or cured meat, such as sausages or meat loaves.  Avoid foods and drinks that have added  sugars.  Eat more fruits, vegetables, and whole grains.  Choose healthy sources of protein, such as fish, poultry, lean cuts of red meat, beans, peas, lentils, and nuts.  Choose healthy sources of fat, such as: ? Nuts. ? Vegetable oils, especially olive oil. ? Fish that have healthy fats (omega-3 fatty acids), such as mackerel or salmon. The items listed above may not be a complete list of recommended foods and beverages. Contact a dietitian for more information. Lifestyle  Lose weight if you are overweight. Losing 5-10 lb (2.3-4.5 kg) can help prevent or control high cholesterol. It can also lower your risk for diabetes and high blood pressure. Ask your health care provider to help you with a diet and exercise plan to lose weight safely.  Do not use any products that contain nicotine or tobacco, such as cigarettes, e-cigarettes, and chewing tobacco. If you need help quitting, ask your health care provider.  Limit your alcohol intake. ? Do not drink alcohol if:  Your health care provider tells you not to drink.  You are pregnant, may be pregnant, or are planning to become pregnant. ? If you drink alcohol:  Limit how much you use to:  0-1 drink a day for women.  0-2 drinks a day for men.  Be aware of how much alcohol is in your drink. In the [place] one drink equals one 12 oz bottle of beer (355 mL), one 5 oz glass of wine (148 mL), or one 1 oz glass of hard liquor (44 mL). Activity   Get enough exercise. Each week, do  at least 150 minutes of exercise that takes a medium level of effort (moderate-intensity exercise). ? This is exercise that:  Makes your heart beat faster and makes you breathe harder than usual.  Allows you to still be able to talk. ? You could exercise in short sessions several times a day or longer sessions a few times a week. For example, on 5 days each week, you could walk fast or ride your bike 3 times a day for 10 minutes each time.  Do exercises as told  by your health care provider. Medicines  In addition to diet and lifestyle changes, your health care provider may recommend medicines to help lower cholesterol. This may be a medicine to lower the amount of cholesterol your liver makes. You may need medicine if: ? Diet and lifestyle changes do not lower your cholesterol enough. ? You have high cholesterol and other risk factors for heart disease or stroke.  Take over-the-counter and prescription medicines only as told by your health care provider. General information  Manage your risk factors for high cholesterol. Talk with your health care provider about all your risk factors and how to lower your risk.  Manage other conditions that you have, such as diabetes or high blood pressure (hypertension).  Have blood tests to check your cholesterol levels at regular points in time as told by your health care provider.  Keep all follow-up visits as told by your health care provider. This is important. Where to find more information  American Heart Association: www.heart.org  National Heart, Lung, and Blood Institute: [employer] Summary  High cholesterol increases your risk for heart disease and stroke. By keeping your cholesterol level low, you can reduce your risk for these conditions.  High cholesterol can often be prevented with diet and lifestyle changes.  Work with your health care provider to manage your risk factors, and have your blood tested regularly. This information is not intended to replace advice given to you by your health care provider. Make sure you discuss any questions you have with your health care provider. Document Revised: 09/24/2018 Document Reviewed: 02/09/2016 Elsevier Patient Education  2020 [employer].   How to Take Your Blood Pressure You can take your blood pressure at home with a machine. You may need to check your blood pressure at home:  To check if you have high blood pressure  (hypertension).  To check your blood pressure over time.  To make sure your blood pressure medicine is working. Supplies needed: You will need a blood pressure machine, or monitor. You can buy one at a drugstore or online. When choosing one:  Choose one with an arm cuff.  Choose one that wraps around your upper arm. Only one finger should fit between your arm and the cuff.  Do not choose one that measures your blood pressure from your wrist or finger. Your doctor can suggest a monitor. How to prepare Avoid these things for 30 minutes before checking your blood pressure:  Drinking caffeine.  Drinking alcohol.  Eating.  Smoking.  Exercising. Five minutes before checking your blood pressure:  Pee.  Sit in a dining chair. Avoid sitting in a soft couch or armchair.  Be quiet. Do not talk. How to take your blood pressure Follow the instructions that came with your machine. If you have a digital blood pressure monitor, these may be the instructions: 1. Sit up straight. 2. Place your feet on the floor. Do not cross your ankles or legs. 3. Rest your  left arm at the level of your heart. You may rest it on a table, desk, or chair. 4. Pull up your shirt sleeve. 5. Wrap the blood pressure cuff around the upper part of your left arm. The cuff should be 1 inch (2.5 cm) above your elbow. It is best to wrap the cuff around bare skin. 6. Fit the cuff snugly around your arm. You should be able to place only one finger between the cuff and your arm. 7. Put the cord inside the groove of your elbow. 8. Press the power button. 9. Sit quietly while the cuff fills with air and loses air. 10. Write down the numbers on the screen. 11. Wait 2-3 minutes and then repeat steps 1-10. What do the numbers mean? Two numbers make up your blood pressure. The first number is called systolic pressure. The second is called diastolic pressure. An example of a blood pressure reading is "120 over 80" (or  120/80). If you are an adult and do not have a medical condition, use this guide to find out if your blood pressure is normal: Normal  First number: below 120.  Second number: below 80. Elevated  First number: 120-129.  Second number: below 80. Hypertension stage 1  First number: 130-139.  Second number: 80-89. Hypertension stage 2  First number: 140 or above.  Second number: 90 or above. Your blood pressure is above normal even if only the top or bottom number is above normal. Follow these instructions at home:  Check your blood pressure as often as your doctor tells you to.  Take your monitor to your next doctor's appointment. Your doctor will: ? Make sure you are using it correctly. ? Make sure it is working right.  Make sure you understand what your blood pressure numbers should be.  Tell your doctor if your medicines are causing side effects. Contact a doctor if:  Your blood pressure keeps being high. Get help right away if:  Your first blood pressure number is higher than 180.  Your second blood pressure number is higher than 120. This information is not intended to replace advice given to you by your health care provider. Make sure you discuss any questions you have with your health care provider. Document Revised: 05/15/2017 Document Reviewed: 11/09/2015 Elsevier Patient Education  2020 [employer].

## 2019-10-24 NOTE — Progress Notes (Signed)
Name: MAGNUM LUNDE   MRN: [id number]    DOB: [birth year]   Date:10/24/2019       Progress Note  Chief Complaint  Patient presents with  . Follow-up  . Hypertension  . Medication Refill     Subjective:   Jeffrey Rivers is a 49 y.o. male, presents to clinic for routine follow up on the conditions listed above.  Hypertension:   Hx of LVH noted on chart Currently managed on norvasc previously on lisinopril 40 mg but that was 4 years ago Pt reports good med compliance and denies any SE.  No lightheadedness, hypotension, syncope. Blood pressure today is above goal, not well controlled. BP Readings from Last 3 Encounters:  10/24/19 132/86  03/21/19 (!) 162/88  02/18/19 124/84  Pt denies CP, SOB, exertional sx, LE edema, palpitation, Ha's, visual disturbances Dietary efforts for BP?  None -he has not been seen in about 8 months since his physical he came in today after being requested to by the office   Obesity:   Weight stable over the past year, not active BMI elevated and he does have a history of hyperlipidemia and hypertension, he is not working on any diet efforts or exercising and generally feels tired all the time  Wt Readings from Last 5 Encounters:  10/24/19 258 lb 9.6 oz (117.3 kg)  03/21/19 262 lb (118.8 kg)  02/18/19 255 lb 1.6 oz (115.7 kg)  02/14/19 262 lb (118.8 kg)  07/09/18 248 lb (112.5 kg)   BMI Readings from Last 5 Encounters:  10/24/19 37.11 kg/m  03/21/19 37.59 kg/m  02/18/19 36.60 kg/m  02/14/19 37.59 kg/m  07/09/18 35.58 kg/m   Hyperlipidemia: Current Medication Regimen: No meds no lifestyle efforts Last Lipids: Lab Results  Component Value Date   CHOL 200 (H) 02/25/2019   HDL 54 02/25/2019   LDLCALC 131 (H) 02/25/2019   TRIG 55 02/25/2019   CHOLHDL 3.7 02/25/2019  The 10-year ASCVD risk score Denman George DC Jr., et al., 2013) is: 7.6%*   Values used to calculate the score:     Age: 83 years     Sex: Male     Is Non-Hispanic African  American: Yes     Diabetic: No     Tobacco smoker: No     Systolic Blood Pressure: 132 mmHg     Is BP treated: Yes     HDL Cholesterol: 51 mg/dL*     Total Cholesterol: 200 mg/dL     * - Cholesterol units were assumed for this score calculation - Denies: Chest pain, shortness of breath, myalgias. - Documented aortic atherosclerosis? No - Risk factors for atherosclerosis: hypercholesterolemia and hypertension      Patient Active Problem List   Diagnosis Date Noted  . Prediabetes 05/21/2018  . Obesity 03/22/2016  . Hyperlipidemia LDL goal <100 03/17/2016  . Hesitancy 12/19/2015  . Benign prostatic hyperplasia with urinary obstruction 11/29/2015  . Essential hypertension, benign 11/05/2015  . LVH (left ventricular hypertrophy) 11/05/2015    Past Surgical History:  Procedure Laterality Date  . APPENDECTOMY    . HERNIA REPAIR Right 06/05/14    Family History  Problem Relation Age of Onset  . Cancer Father 30       colon  . Prostate cancer Neg Hx   . Kidney cancer Neg Hx     Social History   Tobacco Use  . Smoking status: Never Smoker  . Smokeless tobacco: Never Used  Substance Use Topics  . Alcohol use:  No  . Drug use: No      Current Outpatient Medications:  .  amLODipine (NORVASC) 10 MG tablet, Take 1 tablet (10 mg total) by mouth daily., Disp: 90 tablet, Rfl: 1 .  tamsulosin (FLOMAX) 0.4 MG CAPS capsule, Take 2 capsules (0.8 mg total) by mouth daily., Disp: 60 capsule, Rfl: 11  No Known Allergies  Chart Review Today: I personally reviewed active problem list, medication list, allergies, family history, social history, health maintenance, notes from last encounter, lab results, imaging with the patient/caregiver today.   Review of Systems  10 Systems reviewed and are negative for acute change except as noted in the HPI.  Objective:    Vitals:   10/24/19 1354  BP: 132/86  Pulse: 66  Resp: 14  Temp: 97.9 F (36.6 C)  SpO2: 99%  Weight: 258 lb 9.6  oz (117.3 kg)  Height: 5\' 10"  (1.778 m)    Body mass index is 37.11 kg/m.  Physical Exam Vitals and nursing note reviewed.  Constitutional:      General: He is not in acute distress.    Appearance: Normal appearance. He is well-developed. He is obese. He is not ill-appearing, toxic-appearing or diaphoretic.     Interventions: Face mask in place.  HENT:     Head: Normocephalic and atraumatic.     Jaw: No trismus.     Right Ear: External ear normal.     Left Ear: External ear normal.  Eyes:     General: Lids are normal. No scleral icterus.    Conjunctiva/sclera: Conjunctivae normal.     Pupils: Pupils are equal, round, and reactive to light.  Neck:     Trachea: Trachea and phonation normal. No tracheal deviation.  Cardiovascular:     Rate and Rhythm: Normal rate and regular rhythm.     Pulses: Normal pulses.          Radial pulses are 2+ on the right side and 2+ on the left side.       Posterior tibial pulses are 2+ on the right side and 2+ on the left side.     Heart sounds: Normal heart sounds. No murmur. No friction rub. No gallop.   Pulmonary:     Effort: Pulmonary effort is normal. No respiratory distress.     Breath sounds: Normal breath sounds. No stridor. No wheezing, rhonchi or rales.  Abdominal:     General: Bowel sounds are normal. There is no distension.     Palpations: Abdomen is soft.     Tenderness: There is no abdominal tenderness. There is no guarding or rebound.  Musculoskeletal:        General: Normal range of motion.     Cervical back: Normal range of motion and neck supple.     Right lower leg: No edema.     Left lower leg: No edema.  Skin:    General: Skin is warm and dry.     Coloration: Skin is not jaundiced.     Findings: No rash.     Nails: There is no clubbing.  Neurological:     Mental Status: He is alert.     Cranial Nerves: No dysarthria or facial asymmetry.     Motor: No tremor or abnormal muscle tone.     Gait: Gait normal.    Psychiatric:        Mood and Affect: Mood normal.        Speech: Speech normal.  Behavior: Behavior normal. Behavior is cooperative.      PHQ2/9: Depression screen [facility] 2/9 10/24/2019 02/18/2019 07/09/2018 02/18/2018 08/21/2017  Decreased Interest 0 0 0 0 0  Down, Depressed, Hopeless 0 0 0 0 0  PHQ - 2 Score 0 0 0 0 0  Altered sleeping 0 0 0 0 -  Tired, decreased energy 0 0 0 0 -  Change in appetite 0 0 0 0 -  Feeling bad or failure about yourself  0 0 0 0 -  Trouble concentrating 0 0 0 0 -  Moving slowly or fidgety/restless 0 0 0 0 -  Suicidal thoughts 0 0 0 0 -  PHQ-9 Score 0 0 0 0 -  Difficult doing work/chores Not difficult at all Not difficult at all Not difficult at all Not difficult at all -    phq 9 is neg reviewed today  Fall Risk: Fall Risk  10/24/2019 02/18/2019 07/09/2018 02/18/2018 12/25/2017  Falls in the past year? 0 0 0 No No  Number falls in past yr: 0 0 0 - -  Injury with Fall? 0 0 0 - -  Follow up - - Falls evaluation completed - -    Functional Status Survey: Is the patient deaf or have difficulty hearing?: No Does the patient have difficulty seeing, even when wearing glasses/contacts?: No Does the patient have difficulty concentrating, remembering, or making decisions?: No Does the patient have difficulty walking or climbing stairs?: No Does the patient have difficulty dressing or bathing?: No Does the patient have difficulty doing errands alone such as visiting a doctor's office or shopping?: No   Assessment & Plan:   1. Essential hypertension, benign Uncontrolled, not at goal to be <130/80, hx of LVH, discussed importance of adequate blood pressure control to prevent future development of future problems including but not limited to arrhythmias heart failure, heart attack stroke etc. Patient to continue Norvasc and add lisinopril 10 mg, given information about home blood pressure monitoring, encouraged to follow-up soon for recheck of blood pressure Also  encouraged to work on diet lifestyle changes for hyperlipidemia, hypertension and obesity - amLODipine (NORVASC) 10 MG tablet; Take 1 tablet (10 mg total) by mouth daily.  Dispense: 90 tablet; Refill: 3 - lisinopril (ZESTRIL) 10 MG tablet; Take 1 tablet (10 mg total) by mouth daily.  Dispense: 90 tablet; Refill: 3 - COMPLETE METABOLIC PANEL WITH GFR  2. Hyperlipidemia, unspecified hyperlipidemia type High cholesterol in the past not working on any diet or lifestyle efforts encouraged him to decrease transfat saturated fat intake, increase physical activity, given a handout, will recheck in the next few months encouraged him to return in the next 3 months for blood pressure recheck can recheck cholesterol at his physical which would be after September - COMPLETE METABOLIC PANEL WITH GFR - Lipid panel  3. Prediabetes Healthy diet and exercise - will recheck A1C with physical   4. Class 2 severe obesity with serious comorbidity and body mass index (BMI) of 37.0 to 37.9 in adult, unspecified obesity type ([place]) Advised increasing activity, AHA recommendations reviewed.   Return in about 3 months (around 01/24/2020) for HTN HLD f/up .   Delsa Grana, PA-C 10/24/19 2:04 PM

## 2020-01-24 ENCOUNTER — Other Ambulatory Visit: Payer: Self-pay

## 2020-01-24 ENCOUNTER — Ambulatory Visit: Payer: BC Managed Care – PPO | Admitting: Family Medicine

## 2020-01-24 ENCOUNTER — Encounter: Payer: Self-pay | Admitting: Family Medicine

## 2020-01-24 VITALS — BP 118/78 | HR 78 | Temp 98.0°F | Resp 18 | Ht 70.0 in | Wt 253.4 lb

## 2020-01-24 DIAGNOSIS — E785 Hyperlipidemia, unspecified: Secondary | ICD-10-CM | POA: Diagnosis not present

## 2020-01-24 DIAGNOSIS — N138 Other obstructive and reflux uropathy: Secondary | ICD-10-CM | POA: Diagnosis not present

## 2020-01-24 DIAGNOSIS — I10 Essential (primary) hypertension: Secondary | ICD-10-CM

## 2020-01-24 DIAGNOSIS — N401 Enlarged prostate with lower urinary tract symptoms: Secondary | ICD-10-CM

## 2020-01-24 NOTE — Progress Notes (Signed)
Name: Jeffrey Rivers   MRN: [id number]    DOB: [birth year]   Date:01/24/2020       Progress Note  Chief Complaint  Patient presents with  . Follow-up  . Hypertension  . Hyperlipidemia     Subjective:   Jeffrey Rivers is a 49 y.o. male, presents to clinic for routine f/up  Hypertension:  Currently managed on norvasc 10 and lisinopril 10  Pt reports good med compliance and denies any SE.  No lightheadedness, hypotension, syncope. Blood pressure today is well controlled. BP Readings from Last 3 Encounters:  01/24/20 118/78  10/24/19 132/86  03/21/19 (!) 162/88   Pt denies CP, SOB, exertional sx, LE edema, palpitation, Ha's, visual disturbances Dietary efforts for BP?  Trying to be healthier, walking  Energy level a little low, dragging throughout the day - hx of low T-  Some urinary sx, related to BPH, better when taking meds - pt sees urology, but hasn't followed up since last August, previously managed on flomax  9 lbs weight loss in 10 months Wt Readings from Last 5 Encounters:  01/24/20 253 lb 6.4 oz (114.9 kg)  10/24/19 258 lb 9.6 oz (117.3 kg)  03/21/19 262 lb (118.8 kg)  02/18/19 255 lb 1.6 oz (115.7 kg)  02/14/19 262 lb (118.8 kg)   BMI Readings from Last 5 Encounters:  01/24/20 36.36 kg/m  10/24/19 37.11 kg/m  03/21/19 37.59 kg/m  02/18/19 36.60 kg/m  02/14/19 37.59 kg/m   Hyperlipidemia:  hld noted in 2017, pt not currently on meds, managed with diet/lifestyle, last lipid panel was only a few months ago, optimal  Last Lipids: Lab Results  Component Value Date   CHOL 165 10/24/2019   HDL 55 10/24/2019   LDLCALC 96 10/24/2019   TRIG 55 10/24/2019   CHOLHDL 3.0 10/24/2019   - Denies: Chest pain, shortness of breath, myalgias, claudication    Current Outpatient Medications:  .  amLODipine (NORVASC) 10 MG tablet, Take 1 tablet (10 mg total) by mouth daily., Disp: 90 tablet, Rfl: 3 .  lisinopril (ZESTRIL) 10 MG tablet, Take 1 tablet (10 mg  total) by mouth daily., Disp: 90 tablet, Rfl: 3 .  tamsulosin (FLOMAX) 0.4 MG CAPS capsule, Take 2 capsules (0.8 mg total) by mouth daily., Disp: 60 capsule, Rfl: 11  Patient Active Problem List   Diagnosis Date Noted  . Prediabetes 05/21/2018  . Class 2 severe obesity with serious comorbidity and body mass index (BMI) of 37.0 to 37.9 in adult ([facility]) 03/22/2016  . Hyperlipidemia 03/17/2016  . Hesitancy 12/19/2015  . Benign prostatic hyperplasia with urinary obstruction 11/29/2015  . Essential hypertension, benign 11/05/2015  . LVH (left ventricular hypertrophy) 11/05/2015    Past Surgical History:  Procedure Laterality Date  . APPENDECTOMY    . HERNIA REPAIR Right 06/05/14    Family History  Problem Relation Age of Onset  . Cancer Father 7       colon  . Prostate cancer Neg Hx   . Kidney cancer Neg Hx     Social History   Tobacco Use  . Smoking status: Never Smoker  . Smokeless tobacco: Never Used  Vaping Use  . Vaping Use: Never used  Substance Use Topics  . Alcohol use: No  . Drug use: No     No Known Allergies  Health Maintenance  Topic Date Due  . Hepatitis C Screening  Never done  . TETANUS/TDAP  02/22/2022  . COVID-19 Vaccine  Completed  . HIV  Screening  Completed  . INFLUENZA VACCINE  Discontinued    Chart Review Today: I personally reviewed active problem list, medication list, allergies, family history, social history, health maintenance, notes from last encounter, lab results, imaging with the patient/caregiver today.   Review of Systems  10 Systems reviewed and are negative for acute change except as noted in the HPI.  Objective:   Vitals:   01/24/20 1555  BP: 118/78  Pulse: 78  Resp: 18  Temp: 98 F (36.7 C)  TempSrc: Temporal  SpO2: 99%  Weight: 253 lb 6.4 oz (114.9 kg)  Height: 5\' 10"  (1.778 m)    Body mass index is 36.36 kg/m.  Physical Exam Vitals and nursing note reviewed.  Constitutional:      General: He is not in acute  distress.    Appearance: Normal appearance. He is well-developed. He is not ill-appearing, toxic-appearing or diaphoretic.     Interventions: Face mask in place.  HENT:     Head: Normocephalic and atraumatic.     Jaw: No trismus.     Right Ear: External ear normal.     Left Ear: External ear normal.  Eyes:     General: Lids are normal. No scleral icterus.       Right eye: No discharge.        Left eye: No discharge.     Conjunctiva/sclera: Conjunctivae normal.  Neck:     Trachea: Trachea and phonation normal. No tracheal deviation.  Cardiovascular:     Rate and Rhythm: Normal rate and regular rhythm.     Pulses: Normal pulses.          Radial pulses are 2+ on the right side and 2+ on the left side.       Posterior tibial pulses are 2+ on the right side and 2+ on the left side.     Heart sounds: Normal heart sounds. No murmur heard.  No friction rub. No gallop.   Pulmonary:     Effort: Pulmonary effort is normal. No respiratory distress.     Breath sounds: Normal breath sounds. No stridor. No wheezing, rhonchi or rales.  Abdominal:     General: Bowel sounds are normal. There is no distension.     Palpations: Abdomen is soft.  Musculoskeletal:     Right lower leg: No edema.     Left lower leg: No edema.  Skin:    General: Skin is warm and dry.     Coloration: Skin is not jaundiced.     Findings: No rash.     Nails: There is no clubbing.  Neurological:     Mental Status: He is alert. Mental status is at baseline.     Cranial Nerves: No dysarthria or facial asymmetry.     Motor: No tremor or abnormal muscle tone.     Gait: Gait normal.  Psychiatric:        Mood and Affect: Mood normal.        Speech: Speech normal.        Behavior: Behavior normal. Behavior is cooperative.         Assessment & Plan:     ICD-10-CM   1. Essential hypertension, benign  I10    stable, well controlled, last labs were normal, pt compliant with meds, no SE or concerns  2. Hyperlipidemia,  unspecified hyperlipidemia type  E78.5    lipid panel done recently and lipids were normal - not on meds, he is loosing weight, continue diet/lifestyle  efforts - will recheck next year with CPE  3. Benign prostatic hyperplasia with urinary obstruction  N40.1    N13.8    some urinary sx - encouraged him to f/up with urology who is managing   Reviewed all his meds, everything is stable and well controlled currently, continue current management - pt will return for his CPE next month - defer any labs to CPE  Return for CPE after 02/25/2020.   Danelle Berry, PA-C 01/24/20 4:13 PM

## 2020-02-23 ENCOUNTER — Telehealth: Payer: Self-pay | Admitting: Urology

## 2020-02-23 MED ORDER — TAMSULOSIN HCL 0.4 MG PO CAPS: 0.8000 mg | ORAL_CAPSULE | Freq: Every day | ORAL | 0 refills | Status: DC

## 2020-02-23 NOTE — Telephone Encounter (Signed)
Patient has a 1 year follow up with Dr. Sherron Monday on Monday 9/13.  He is completely out of tamsulosin and is having difficulty urinating.   Patient is requesting a prescription for a few pills to get him through the weekend until his appointment.   Please advise patient.

## 2020-02-23 NOTE — Telephone Encounter (Signed)
30 day refill sent.

## 2020-02-27 ENCOUNTER — Ambulatory Visit: Payer: Self-pay | Admitting: Urology

## 2020-02-27 ENCOUNTER — Ambulatory Visit: Payer: BC Managed Care – PPO | Admitting: Urology

## 2020-02-27 ENCOUNTER — Other Ambulatory Visit: Payer: Self-pay

## 2020-02-27 VITALS — BP 141/96 | HR 71 | Ht 70.0 in | Wt 252.0 lb

## 2020-02-27 DIAGNOSIS — R351 Nocturia: Secondary | ICD-10-CM | POA: Diagnosis not present

## 2020-02-27 MED ORDER — TAMSULOSIN HCL 0.4 MG PO CAPS: 0.8000 mg | ORAL_CAPSULE | Freq: Every day | ORAL | 3 refills | Status: DC

## 2020-02-27 NOTE — Progress Notes (Signed)
.  He also has long-term urinary urgency and occasional urge.  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  We discussed behavioral strategies at length regarding his urinary urgency, and avoiding bladder irritants including coffee, soda, and diet drinks.  02/27/2020 3:34 PM   Jeffrey Rivers 02-15-1971 [id number]  Referring provider: Danelle Berry, PA-C [address] [place],  [place] [address]  No chief complaint on file.   HPI: 2017: [name] with h/oBPH and symptoms of weak stream, frequency, urgency, nocturia, intermittent flow, hesitancy and straining to void. He's had symptoms for several months. He tried tamsulosin which improved his symptoms but gave him dizziness. He denies trouble getting or maintaining an erection. He is most bothered hesitancy and weak stream which follows an urge to void. His PVR was 0 ml.  PSA was 0.19 Oct 2015.  He tried alfuzosinbut it made him feel drowsy and gave him heart palpitations and he stopped. Of note it did improve his weak stream.The patient had cystoscopy noting a prominent obstructing median lobe.He was counseled about transurethral resection versus other procedures and was given a trial of Rapaflo  I believe the patient is back on Flomax. Flow is much better. Nocturia grade 2. Clinically no infections. No blood in the urine  Today Patient's flow was getting slower.  He hesitates.  On times he stops and starts and does not feel 90.  Sometimes he sits  No history of bladder infections.  Getting up 3 times a night and is still on Flomax  40 g benign prostate bed based on body habitus gland is more difficult to  feel.  Male genitalia normal  Patient has flow symptoms.  In 2017 he spoke to Dr. Mena Rivers regarding transurethral resection of prostate and other procedures and watchful waiting.  His gland was not that large but he had what appeared to be an obstructing middle lobe.  The role of urodynamics in a young man with voiding dysfunction discussed.  Pathophysiology of slow flow discussed.  He understands I would not aggressively manage his nighttime frequency currently independent of his prostate since his flow symptoms are quite significant.  He want to try Flomax 0.8 mg a day and come back in a year.  He gets his PSA done by his primary care doctor.  His symptoms date back approximately 3 years which is young for benign prostatic hyperplasia  Today Frequency stable Flow is stable and reasonable.  Still gets up 2 or 3 times a night.  We talked about decreased libido but we did not order testosterone.  Questions answered.  68-month supply and 3 refills sent  PMH: Past Medical History:  Diagnosis Date  . Essential hypertension, benign 11/05/2015  . Hypertension     Surgical History: Past Surgical History:  Procedure Laterality Date  . APPENDECTOMY    . HERNIA REPAIR Right 06/05/14    Home Medications:  Allergies as of 02/27/2020   No Known Allergies     Medication List       Accurate as of February 27, 2020  3:34 PM. If you have any questions, ask your nurse or doctor.        amLODipine 10 MG tablet Commonly known as: NORVASC Take 1 tablet (10 mg  total) by mouth daily.   lisinopril 10 MG tablet Commonly known as: ZESTRIL Take 1 tablet (10 mg total) by mouth daily.   tamsulosin 0.4 MG Caps capsule Commonly known as: FLOMAX Take 2 capsules (0.8 mg total) by mouth daily.       Allergies: No Known Allergies  Family History: Family History  Problem Relation Age of Onset  . Cancer Father 71       colon  . Prostate cancer Neg Hx   . Kidney cancer Neg Hx     Social History:   reports that he has never smoked. He has never used smokeless tobacco. He reports that he does not drink alcohol and does not use drugs.  ROS:                                        Physical Exam: There were no vitals taken for this visit.  Constitutional:  Alert and oriented, No acute distress.   Laboratory Data: Lab Results  Component Value Date   WBC 3.3 (L) 02/25/2019   HGB 13.7 02/25/2019   HCT 40.9 02/25/2019   MCV 81.6 02/25/2019   PLT 203 02/25/2019    Lab Results  Component Value Date   CREATININE 1.07 10/24/2019    Lab Results  Component Value Date   PSA 0.4 02/02/2017    No results found for: TESTOSTERONE  Lab Results  Component Value Date   HGBA1C 5.9 (H) 02/25/2019    Urinalysis    Component Value Date/Time   APPEARANCEUR Clear 02/14/2019 0841   GLUCOSEU Negative 02/14/2019 0841   BILIRUBINUR Negative 02/14/2019 0841   PROTEINUR Negative 02/14/2019 0841   NITRITE Negative 02/14/2019 0841   LEUKOCYTESUR Negative 02/14/2019 0841    Pertinent Imaging:   Assessment & Plan: Reassess in 1 year.  Still hard to feel prostate but by history his PSA is normal by primary care  There are no diagnoses linked to this encounter.  No follow-ups on file.  Jeffrey Sinner, MD  [facility] [address], [address] [place], [place] [address] [phone]

## 2020-03-22 NOTE — Progress Notes (Deleted)
Patient: Jeffrey Rivers, Male    DOB: 1971-04-05, 49 y.o.   MRN: [id number] Danelle Berry, PA-C Visit Date: 03/22/2020  Today's Provider: Danelle Berry, PA-C   No chief complaint on file.  Subjective:   Annual physical exam:  Jeffrey Rivers is a 49 y.o. male who presents today for health maintenance and annual & complete physical exam.   Exercise/Activity:  Diet/nutrition:  Sleep:   Pt wished to discuss acute complaints *** do routine f/up on chronic conditions today in addition to CPE. Advised pt of separate visit billing/coding  USPSTF grade A and B recommendations - reviewed and addressed today  Depression:  Phq 9 completed today by patient, was reviewed by me with patient in the room, score is  {Desc; negative/positive:13464::"negative"}, pt feels *** PHQ 2/9 Scores 01/24/2020 10/24/2019 02/18/2019 07/09/2018  PHQ - 2 Score 0 0 0 0  PHQ- 9 Score 0 0 0 0   Depression screen [facility] 2/9 01/24/2020 10/24/2019 02/18/2019 07/09/2018 02/18/2018  Decreased Interest 0 0 0 0 0  Down, Depressed, Hopeless 0 0 0 0 0  PHQ - 2 Score 0 0 0 0 0  Altered sleeping 0 0 0 0 0  Tired, decreased energy 0 0 0 0 0  Change in appetite 0 0 0 0 0  Feeling bad or failure about yourself  0 0 0 0 0  Trouble concentrating 0 0 0 0 0  Moving slowly or fidgety/restless 0 0 0 0 0  Suicidal thoughts 0 0 0 0 0  PHQ-9 Score 0 0 0 0 0  Difficult doing work/chores Not difficult at all Not difficult at all Not difficult at all Not difficult at all Not difficult at all    Hep C Screening: *** STD testing and prevention (HIV/chl/gon/syphilis): *** Intimate partner violence:***  Prostate cancer: *** Prostate cancer screening with PSA: Discussed risks and benefits of PSA testing and provided handout. Pt *** to have PSA drawn today.  Lab Results  Component Value Date   PSA 0.4 02/02/2017     Urinary Symptoms:  IPSS Questionnaire (AUA-7): Over the past month.   1)  How often have you had a sensation of not  emptying your bladder completely after you finish urinating?  {Rating:19227}  2)  How often have you had to urinate again less than two hours after you finished urinating? {Rating:19227}  3)  How often have you found you stopped and started again several times when you urinated?  {Rating:19227}  4) How difficult have you found it to postpone urination?  {Rating:19227}  5) How often have you had a weak urinary stream?  {Rating:19227}  6) How often have you had to push or strain to begin urination?  {Rating:19227}  7) How many times did you most typically get up to urinate from the time you went to bed until the time you got up in the morning?  {Rating:19228}  Total score:  0-7 mildly symptomatic   8-19 moderately symptomatic   20-35 severely symptomatic    Advanced Care Planning:  A voluntary discussion about advance care planning including the explanation and discussion of advance directives.  Discussed health care proxy and Living will, and the patient was able to identify a health care proxy as ***.  Patient {DOES_DOES WNI:62703} have a living will at present time. If patient does have living will, I have requested they bring this to the clinic to be scanned in to their chart.  Health Maintenance  Topic Date Due  .  Hepatitis C Screening  Never done  . TETANUS/TDAP  02/22/2022  . COVID-19 Vaccine  Completed  . HIV Screening  Completed  . INFLUENZA VACCINE  Discontinued     Skin cancer: ***  last skin survey was.  Pt reports *** hx of skin cancer, suspicious lesions/biopsies in the past.  Colorectal cancer:  colonoscopy is ***   Pt denies ***  Lung cancer:  *** Low Dose CT Chest recommended if Age 2-80 years, 20 pack-year currently smoking OR have quit w/in 15years. Patient {DOES NOT does:27190::"does not"} qualify.   Social History   Tobacco Use  . Smoking status: Never Smoker  . Smokeless tobacco: Never Used  Substance Use Topics  . Alcohol use: No     Alcohol screening:    [facility]  AUDIT-C Score 0      AAA: *** The USPSTF recommends one-time screening with ultrasonography in men ages 7865 to 1475 years who have ever smoked  ECG:***  Blood pressure/Hypertension: BP Readings from Last 3 Encounters:  02/27/20 (!) 141/96  01/24/20 118/78  10/24/19 132/86   Weight/Obesity: Wt Readings from Last 3 Encounters:  02/27/20 252 lb (114.3 kg)  01/24/20 253 lb 6.4 oz (114.9 kg)  10/24/19 258 lb 9.6 oz (117.3 kg)   BMI Readings from Last 3 Encounters:  02/27/20 36.16 kg/m  01/24/20 36.36 kg/m  10/24/19 37.11 kg/m    Lipids:  Lab Results  Component Value Date   CHOL 165 10/24/2019   CHOL 200 (H) 02/25/2019   CHOL 198 05/19/2018   Lab Results  Component Value Date   HDL 55 10/24/2019   HDL 54 02/25/2019   HDL 60 05/19/2018   Lab Results  Component Value Date   LDLCALC 96 10/24/2019   LDLCALC 131 (H) 02/25/2019   LDLCALC 121 (H) 05/19/2018   Lab Results  Component Value Date   TRIG 55 10/24/2019   TRIG 55 02/25/2019   TRIG 71 05/19/2018   Lab Results  Component Value Date   CHOLHDL 3.0 10/24/2019   CHOLHDL 3.7 02/25/2019   CHOLHDL 3.3 05/19/2018   No results found for: LDLDIRECT Based on the results of lipid panel his/her cardiovascular risk factor ( using Poole Cohort )  in the next 10 years is : The 10-year ASCVD risk score Denman George(Goff DC Montez HagemanJr., et al., 2013) is: 8.3%   Values used to calculate the score:     Age: [age] years     Sex: Male     Is Non-Hispanic African American: Yes     Diabetic: No     Tobacco smoker: No     Systolic Blood Pressure: 141 mmHg     Is BP treated: Yes     HDL Cholesterol: 55 mg/dL     Total Cholesterol: 165 mg/dL Glucose:  Glucose, Bld  Date Value Ref Range Status  10/24/2019 97 65 - 99 mg/dL Final    Comment:    .            Fasting reference interval .   02/25/2019 95 65 - 99 mg/dL Final    Comment:    .            Fasting reference interval .    05/19/2018 88 65 - 99 mg/dL Final    Comment:    .            Fasting reference interval .     Social History  He  reports that he has never smoked. He has never used smokeless tobacco. He reports that he does not drink alcohol and does not use drugs.       Social History   Socioeconomic History  . Marital status: Married    Spouse name: Not on file  . Number of children: Not on file  . Years of education: Not on file  . Highest education level: Not on file  Occupational History  . Not on file  Tobacco Use  . Smoking status: Never Smoker  . Smokeless tobacco: Never Used  Vaping Use  . Vaping Use: Never used  Substance and Sexual Activity  . Alcohol use: No  . Drug use: No  . Sexual activity: Not Currently  Other Topics Concern  . Not on file  Social History Narrative  . Not on file   Social Determinants of Health   Financial Resource Strain:   . Difficulty of Paying Living Expenses: Not on file  Food Insecurity:   . Worried About Programme researcher, broadcasting/film/video in the Last Year: Not on file  . Ran Out of Food in the Last Year: Not on file  Transportation Needs:   . Lack of Transportation (Medical): Not on file  . Lack of Transportation (Non-Medical): Not on file  Physical Activity:   . Days of Exercise per Week: Not on file  . Minutes of Exercise per Session: Not on file  Stress:   . Feeling of Stress : Not on file  Social Connections:   . Frequency of Communication with Friends and Family: Not on file  . Frequency of Social Gatherings with Friends and Family: Not on file  . Attends Religious Services: Not on file  . Active Member of Clubs or Organizations: Not on file  . Attends Banker Meetings: Not on file  . Marital Status: Not on file     Family History        Family Status  Relation Name Status  . Mother  Alive  . Father  Deceased  . Neg Hx  (Not Specified)        His family history includes Cancer (age of onset: 46) in his father.  There is no history of Prostate cancer or Kidney cancer.       Family History  Problem Relation Age of Onset  . Cancer Father 45       colon  . Prostate cancer Neg Hx   . Kidney cancer Neg Hx     Patient Active Problem List   Diagnosis Date Noted  . Prediabetes 05/21/2018  . Class 2 severe obesity with serious comorbidity and body mass index (BMI) of 37.0 to 37.9 in adult (HCC) 03/22/2016  . Hyperlipidemia 03/17/2016  . Hesitancy 12/19/2015  . Benign prostatic hyperplasia with urinary obstruction 11/29/2015  . Essential hypertension, benign 11/05/2015  . LVH (left ventricular hypertrophy) 11/05/2015    Past Surgical History:  Procedure Laterality Date  . APPENDECTOMY    . HERNIA REPAIR Right 06/05/14     Current Outpatient Medications:  .  amLODipine (NORVASC) 10 MG tablet, Take 1 tablet (10 mg total) by mouth daily., Disp: 90 tablet, Rfl: 3 .  lisinopril (ZESTRIL) 10 MG tablet, Take 1 tablet (10 mg total) by mouth daily., Disp: 90 tablet, Rfl: 3 .  tamsulosin (FLOMAX) 0.4 MG CAPS capsule, Take 2 capsules (0.8 mg total) by mouth daily., Disp: 180 capsule, Rfl: 3  No Known Allergies  Patient Care Team: Angelica Chessman,  Jimmy Footman as PCP - General (Family Medicine) Kieth Brightly, MD (General Surgery) Dennison Mascot, MD (Family Medicine)   Chart Review: ***  Review of Systems        Objective:   Vitals:  There were no vitals filed for this visit.  There is no height or weight on file to calculate BMI.  Physical Exam   No results found for this or any previous visit (from the past 2160 hour(s)).  Diabetic Foot Exam: Diabetic Foot Exam - Simple   No data filed      Fall Risk: Fall Risk  01/24/2020 10/24/2019 02/18/2019 07/09/2018 02/18/2018  Falls in the past year? 0 0 0 0 No  Number falls in past yr: 0 0 0 0 -  Injury with Fall? 0 0 0 0 -  Follow up Falls evaluation completed - - Falls evaluation completed -    Functional Status Survey:      Assessment & Plan:    CPE completed today ***  . Prostate cancer screening and PSA options (with potential risks and benefits of testing vs not testing) were discussed along with recent recs/guidelines, shared decision making and handout/information given to pt today  . USPSTF grade A and B recommendations reviewed with patient; age-appropriate recommendations, preventive care, screening tests, etc discussed and encouraged; healthy living encouraged; see AVS for patient education given to patient  . Discussed importance of 150 minutes of physical activity weekly, AHA exercise recommendations given to pt in AVS/handout  . Discussed importance of healthy diet:  eating lean meats and proteins, avoiding trans fats and saturated fats, avoid simple sugars and excessive carbs in diet, eat 6 servings of fruit/vegetables daily and drink plenty of water and avoid sweet beverages.  DASH diet reviewed if pt has HTN  . Recommended pt to do annual eye exam and routine dental exams/cleanings  . Reviewed Health Maintenance: Health Maintenance  Topic Date Due  . Hepatitis C Screening  Never done  . TETANUS/TDAP  02/22/2022  . COVID-19 Vaccine  Completed  . HIV Screening  Completed  . INFLUENZA VACCINE  Discontinued    . Immunizations: Immunization History  Administered Date(s) Administered  . PFIZER SARS-COV-2 Vaccination 08/25/2019, 09/15/2019    . *** Recommended pt to return in late Sept or October to clinic for flu shot/flu clinic     Marcos Eke, [facility] 03/22/20 4:05 PM  [facility]

## 2020-03-23 ENCOUNTER — Encounter: Payer: BC Managed Care – PPO | Admitting: Family Medicine

## 2020-05-04 ENCOUNTER — Encounter: Payer: BC Managed Care – PPO | Admitting: Family Medicine

## 2020-06-04 ENCOUNTER — Encounter: Payer: BC Managed Care – PPO | Admitting: Family Medicine

## 2020-06-11 ENCOUNTER — Ambulatory Visit: Payer: Self-pay

## 2020-06-11 NOTE — Telephone Encounter (Signed)
Patient called and says he's feeling no energy, tired and weak. He says this has been ongoing for 1-2 months. He says it interferes with work, but he's able to perform his job duties. He denies any other symptoms. Appointment scheduled for tomorrow at 1400 with Danelle Berry, PA-C, care advice given, patient verbalized understanding.  Reason for Disposition . [1] MODERATE weakness (i.e., interferes with work, school, normal activities) AND [2] persists > 3 days  Answer Assessment - Initial Assessment Questions 1. DESCRIPTION: "Describe how you are feeling."     1-2 months 2. SEVERITY: "How bad is it?"  "Can you stand and walk?"   - MILD - Feels weak or tired, but does not interfere with work, school or normal activities   - MODERATE - Able to stand and walk; weakness interferes with work, school, or normal activities   - SEVERE - Unable to stand or walk     Moderate-able to perform work 3. ONSET:  "When did the weakness begin?"     1-2 months 4. CAUSE: "What do you think is causing the weakness?"     I don't know 5. MEDICINES: "Have you recently started a new medicine or had a change in the amount of a medicine?"     No 6. OTHER SYMPTOMS: "Do you have any other symptoms?" (e.g., chest pain, fever, cough, SOB, vomiting, diarrhea, bleeding, other areas of pain)     No 7. PREGNANCY: "Is there any chance you are pregnant?" "When was your last menstrual period?"     N/A  Protocols used: WEAKNESS (GENERALIZED) AND FATIGUE-A-AH

## 2020-06-12 ENCOUNTER — Ambulatory Visit: Payer: BC Managed Care – PPO | Admitting: Family Medicine

## 2020-06-12 ENCOUNTER — Encounter: Payer: Self-pay | Admitting: Family Medicine

## 2020-06-12 ENCOUNTER — Other Ambulatory Visit: Payer: Self-pay

## 2020-06-12 VITALS — BP 120/82 | HR 70 | Temp 98.0°F | Resp 18 | Ht 70.0 in | Wt 257.1 lb

## 2020-06-12 DIAGNOSIS — Z6837 Body mass index (BMI) 37.0-37.9, adult: Secondary | ICD-10-CM | POA: Diagnosis not present

## 2020-06-12 DIAGNOSIS — R7303 Prediabetes: Secondary | ICD-10-CM

## 2020-06-12 DIAGNOSIS — R5383 Other fatigue: Secondary | ICD-10-CM | POA: Diagnosis not present

## 2020-06-12 DIAGNOSIS — I10 Essential (primary) hypertension: Secondary | ICD-10-CM

## 2020-06-12 DIAGNOSIS — R7989 Other specified abnormal findings of blood chemistry: Secondary | ICD-10-CM

## 2020-06-12 DIAGNOSIS — Z87898 Personal history of other specified conditions: Secondary | ICD-10-CM

## 2020-06-12 NOTE — Progress Notes (Signed)
Patient ID: Jeffrey Rivers, male    DOB: [birth year], 49 y.o.   MRN: [id number]  PCP: Danelle Berry, PA-C  Chief Complaint  Patient presents with  . Fatigue    Low energy for 2 months    Subjective:   Jeffrey Rivers is a 49 y.o. male, presents to clinic with CC of the following:  HPI  Fatigue x 2 months No new med changes On flomax for BPH with LUTS Lisinopril and amlodipine for HTN  Hx of prediabetes Lab Results  Component Value Date   HGBA1C 5.9 (H) 02/25/2019  He is concerned about his blood sugars he does note urinary frequency and nocturia but he is pushing a lot of fluids and he does have BPH  No past thyroid issues for him or in his immediate family history Lab Results  Component Value Date   TSH 1.56 02/25/2019   Patient notes a history of low T and getting topical testosterone treatments in the past but is not on his chart no recent testosterone labs available No results found for: TESTOSTERONE  No history of anemia Lab Results  Component Value Date   WBC 3.3 (L) 02/25/2019   HGB 13.7 02/25/2019   HCT 40.9 02/25/2019   MCV 81.6 02/25/2019   PLT 203 02/25/2019   Weight roughly his baseline- obesity He has not been working on diet very much recently due to the holidays but has continued to exercise no significant weight changes Wt Readings from Last 5 Encounters:  06/12/20 257 lb 1.6 oz (116.6 kg)  02/27/20 252 lb (114.3 kg)  01/24/20 253 lb 6.4 oz (114.9 kg)  10/24/19 258 lb 9.6 oz (117.3 kg)  03/21/19 262 lb (118.8 kg)   BMI Readings from Last 5 Encounters:  06/12/20 36.89 kg/m  02/27/20 36.16 kg/m  01/24/20 36.36 kg/m  10/24/19 37.11 kg/m  03/21/19 37.59 kg/m   Mood: Overall he states that his mood is pretty good sometimes 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, environmental, patient did agree to do full PHQ-9 and GAD-7 screening today Depression screen [facility] 2/9 06/12/2020 06/12/2020 01/24/2020  Decreased Interest 1 1 0  Down, Depressed,  Hopeless 0 0 0  PHQ - 2 Score 1 1 0  Altered sleeping 1 - 0  Tired, decreased energy 1 - 0  Change in appetite 0 - 0  Feeling bad or failure about yourself  1 - 0  Trouble concentrating 1 - 0  Moving slowly or fidgety/restless 1 - 0  Suicidal thoughts 0 - 0  PHQ-9 Score 6 - 0  Difficult doing work/chores - - Not difficult at all   GAD 7 : Generalized Anxiety Score 06/12/2020  Nervous, Anxious, on Edge 1  Control/stop worrying 1  Worry too much - different things 1  Trouble relaxing 1  Restless 0  Easily annoyed or irritable 1  Afraid - awful might happen 1  Total GAD 7 Score 6     Sleep: Sleeps good usually 8 pm to 3 to 5 am, does feel tired He does snore Does not always feel well rested upon waking He does feel that if he sits quietly that he will just "crash", no past sleep study  Had one weird episode of laughing where afterwards he felt lightheaded  No CP palpitations or exertional sx, exercising 3 d a week    Results of the Epworth flowsheet 06/12/2020 02/18/2019  Sitting and reading 1 2  Watching TV 2 1  Sitting, inactive  in a public place (e.g. a theatre or a meeting) 2 2  As a passenger in a car for an hour without a break 1 2  Lying down to rest in the afternoon when circumstances permit 0 3  Sitting and talking to someone 1 1  Sitting quietly after a lunch without alcohol 1 1  In a car, while stopped for a few minutes in traffic 0 0  Total score 8 12    Patient Active Problem List   Diagnosis Date Noted  . Prediabetes 05/21/2018  . Class 2 severe obesity with serious comorbidity and body mass index (BMI) of 37.0 to 37.9 in adult (HCC) 03/22/2016  . Hyperlipidemia 03/17/2016  . Hesitancy 12/19/2015  . Benign prostatic hyperplasia with urinary obstruction 11/29/2015  . Essential hypertension, benign 11/05/2015  . LVH (left ventricular hypertrophy) 11/05/2015      Current Outpatient Medications:  .  amLODipine (NORVASC) 10 MG tablet, Take 1 tablet (10  mg total) by mouth daily., Disp: 90 tablet, Rfl: 3 .  lisinopril (ZESTRIL) 10 MG tablet, Take 1 tablet (10 mg total) by mouth daily., Disp: 90 tablet, Rfl: 3 .  tamsulosin (FLOMAX) 0.4 MG CAPS capsule, Take 2 capsules (0.8 mg total) by mouth daily., Disp: 180 capsule, Rfl: 3   No Known Allergies   Social History   Tobacco Use  . Smoking status: Never Smoker  . Smokeless tobacco: Never Used  Vaping Use  . Vaping Use: Never used  Substance Use Topics  . Alcohol use: No  . Drug use: No      Chart Review Today: I personally reviewed active problem list, medication list, allergies, family history, social history, health maintenance, notes from last encounter, lab results, imaging with the patient/caregiver today.   Review of Systems 10 Systems reviewed and are negative for acute change except as noted in the HPI.     Objective:   Vitals:   06/12/20 1403  BP: 120/82  Pulse: 70  Resp: 18  Temp: 98 F (36.7 C)  TempSrc: Oral  SpO2: 98%  Weight: 257 lb 1.6 oz (116.6 kg)  Height: 5\' 10"  (1.778 m)    Body mass index is 36.89 kg/m.  Physical Exam Vitals and nursing note reviewed.  Constitutional:      General: He is not in acute distress.    Appearance: Normal appearance. He is well-developed. He is obese. He is not ill-appearing, toxic-appearing or diaphoretic.  HENT:     Head: Normocephalic and atraumatic.     Right Ear: External ear normal.     Left Ear: External ear normal.     Nose: Nose normal.  Eyes:     General:        Right eye: No discharge.        Left eye: No discharge.     Conjunctiva/sclera: Conjunctivae normal.  Neck:     Trachea: No tracheal deviation.  Cardiovascular:     Rate and Rhythm: Normal rate and regular rhythm. Occasional extrasystoles are present.    Chest Wall: PMI is not displaced.     Pulses: Normal pulses.          Radial pulses are 2+ on the right side and 2+ on the left side.     Heart sounds: Normal heart sounds. No murmur  heard. No friction rub. No gallop.   Pulmonary:     Effort: Pulmonary effort is normal. No respiratory distress.     Breath sounds: Normal breath sounds. No stridor.  No wheezing, rhonchi or rales.  Abdominal:     General: Bowel sounds are normal.     Palpations: Abdomen is soft.  Musculoskeletal:        General: Normal range of motion.  Skin:    General: Skin is warm and dry.     Coloration: Skin is not jaundiced or pale.     Findings: No rash.  Neurological:     Mental Status: He is alert. Mental status is at baseline.     Motor: No abnormal muscle tone.     Coordination: Coordination normal.     Gait: Gait normal.  Psychiatric:        Mood and Affect: Mood and affect and mood normal.        Behavior: Behavior normal.      Results for orders placed or performed in visit on 10/24/19  COMPLETE METABOLIC PANEL WITH GFR  Result Value Ref Range   Glucose, Bld 97 65 - 99 mg/dL   BUN 13 7 - 25 mg/dL   Creat 8.24 2.35 - 3.61 mg/dL   GFR, Est Non African American 83 > OR = 60 mL/min/1.18m2   GFR, Est African American 96 > OR = 60 mL/min/1.69m2   BUN/Creatinine Ratio NOT APPLICABLE 6 - 22 (calc)   Sodium 140 135 - 146 mmol/L   Potassium 4.4 3.5 - 5.3 mmol/L   Chloride 105 98 - 110 mmol/L   CO2 29 20 - 32 mmol/L   Calcium 9.2 8.6 - 10.3 mg/dL   Total Protein 6.4 6.1 - 8.1 g/dL   Albumin 4.4 3.6 - 5.1 g/dL   Globulin 2.0 1.9 - 3.7 g/dL (calc)   AG Ratio 2.2 1.0 - 2.5 (calc)   Total Bilirubin 0.5 0.2 - 1.2 mg/dL   Alkaline phosphatase (APISO) 39 36 - 130 U/L   AST 28 10 - 40 U/L   ALT 26 9 - 46 U/L  Lipid panel  Result Value Ref Range   Cholesterol 165 <200 mg/dL   HDL 55 > OR = 40 mg/dL   Triglycerides 55 <443 mg/dL   LDL Cholesterol (Calc) 96 mg/dL (calc)   Total CHOL/HDL Ratio 3.0 <5.0 (calc)   Non-HDL Cholesterol (Calc) 110 <130 mg/dL (calc)       Assessment & Plan:   1.  Fatigue, unspecified type DDx broad - r/o anemia, electrolyte derrangement, hypothyroid, new  onset or uncontrolled diabetes, low T Also may be related to mood, depression, stress, sleep apnea? ESS score of 8, down from prior screening last year Explained to him that we will do the work-up, he can return later for testosterone labs which need to be done in the early morning hours Explained to him that if everything is unremarkable we can proceed with a sleep study Encouraged him to get adequate sleep, continue good hydration, may want to do some multivitamins or B12 supplements, and continue healthy diet and exercise or walking, in addition to good self-care and attention to his own needs for good mental health and stress coping skills and outlets  Patient is very pleasant, very well-appearing, I do not have any cardiac concerns related to his fatigue no associated symptoms which are alarming at all  - CBC with Differential/Platelet - COMPLETE METABOLIC PANEL WITH GFR - Hemoglobin A1c - TSH - Testosterone  2.  Prediabetes Recheck labs - COMPLETE METABOLIC PANEL WITH GFR - Hemoglobin A1c  3. Class 2 severe obesity with serious comorbidity and body mass index (BMI) of 37.0 to  37.9 in adult, unspecified obesity type (HCC) Encouraged healthy diet lifestyle increasing activity even if this means walking multiple times a week - COMPLETE METABOLIC PANEL WITH GFR - Hemoglobin A1c - TSH  4. Essential hypertension, benign Blood pressure stable, well controlled, BP at goal today continue amlodipine and lisinopril - COMPLETE METABOLIC PANEL WITH GFR  5. Low testosterone in male hx of, did topical testosterone replacement in the past Discussed needing to do labs 2-3 x and f/up with urology if low - Testosterone  6. History of snoring may need to do sleep study - ESS done today - wait for labs and refer to sleep study if lab work up is unremarkable - noted snoring, dozing off if he sits for a little while, obesity and body habitus - possibly OSA     Danelle BerryLeisa Hensley Aziz, PA-C 06/12/20 2:08  PM

## 2020-06-12 NOTE — Patient Instructions (Signed)
I will call you with lab results  Return between 8 and 9 am for testosterone labs - any day we are open, tell the front desk you are here for labs  We may want to consider doing a sleep study to rule out sleep apnea  Fatigue If you have fatigue, you feel tired all the time and have a lack of energy or a lack of motivation. Fatigue may make it difficult to start or complete tasks because of exhaustion. In general, occasional or mild fatigue is often a normal response to activity or life. However, long-lasting (chronic) or extreme fatigue may be a symptom of a medical condition. Follow these instructions at home: General instructions  Watch your fatigue for any changes.  Go to bed and get up at the same time every day.  Avoid fatigue by pacing yourself during the day and getting enough sleep at night.  Maintain a healthy weight. Medicines  Take over-the-counter and prescription medicines only as told by your health care provider.  Take a multivitamin, if told by your health care provider.  Do not use herbal or dietary supplements unless they are approved by your health care provider. Activity   Exercise regularly, as told by your health care provider.  Use or practice techniques to help you relax, such as yoga, tai chi, meditation, or massage therapy. Eating and drinking   Avoid heavy meals in the evening.  Eat a well-balanced diet, which includes lean proteins, whole grains, plenty of fruits and vegetables, and low-fat dairy products.  Avoid consuming too much caffeine.  Avoid the use of alcohol.  Drink enough fluid to keep your urine pale yellow. Lifestyle  Change situations that cause you stress. Try to keep your work and personal schedule in balance.  Do not use any products that contain nicotine or tobacco, such as cigarettes and e-cigarettes. If you need help quitting, ask your health care provider.  Do not use drugs. Contact a health care provider if:  Your  fatigue does not get better.  You have a fever.  You suddenly lose or gain weight.  You have headaches.  You have trouble falling asleep or sleeping through the night.  You feel angry, guilty, anxious, or sad.  You are unable to have a bowel movement (constipation).  Your skin is dry.  You have swelling in your legs or another part of your body. Get help right away if:  You feel confused.  Your vision is blurry.  You feel faint or you pass out.  You have a severe headache.  You have severe pain in your abdomen, your back, or the area between your waist and hips (pelvis).  You have chest pain, shortness of breath, or an irregular or fast heartbeat.  You are unable to urinate, or you urinate less than normal.  You have abnormal bleeding, such as bleeding from the rectum, vagina, nose, lungs, or nipples.  You vomit blood.  You have thoughts about hurting yourself or others. If you ever feel like you may hurt yourself or others, or have thoughts about taking your own life, get help right away. You can go to your nearest emergency department or call:  Your local emergency services (911 in the [place]  A suicide crisis helpline, such as the [facility] at 760-864-7868. This is open 24 hours a day. Summary  If you have fatigue, you feel tired all the time and have a lack of energy or a lack of motivation.  Fatigue may make it difficult to start or complete tasks because of exhaustion.  Long-lasting (chronic) or extreme fatigue may be a symptom of a medical condition.  Exercise regularly, as told by your health care provider.  Change situations that cause you stress. Try to keep your work and personal schedule in balance. This information is not intended to replace advice given to you by your health care provider. Make sure you discuss any questions you have with your health care provider. Document Revised: 12/22/2018 Document Reviewed:  02/25/2017 Elsevier Patient Education  2020 [employer].

## 2020-06-13 LAB — HEMOGLOBIN A1C
Hgb A1c MFr Bld: 6.1 % of total Hgb — ABNORMAL HIGH (ref <5.7)
Mean Plasma Glucose: 128 mg/dL
eAG (mmol/L): 7.1 mmol/L

## 2020-06-13 LAB — CBC WITH DIFFERENTIAL/PLATELET
Absolute Monocytes: 280 cells/uL (ref 200–950)
Basophils Absolute: 20 cells/uL (ref 0–200)
Basophils Relative: 0.4 %
Eosinophils Absolute: 40 cells/uL (ref 15–500)
Eosinophils Relative: 0.8 %
HCT: 43 % (ref 38.5–50.0)
Hemoglobin: 14.9 g/dL (ref 13.2–17.1)
Lymphs Abs: 2540 cells/uL (ref 850–3900)
MCH: 28.1 pg (ref 27.0–33.0)
MCHC: 34.7 g/dL (ref 32.0–36.0)
MCV: 81.1 fL (ref 80.0–100.0)
MPV: 10.9 fL (ref 7.5–12.5)
Monocytes Relative: 5.6 %
Neutro Abs: 2120 cells/uL (ref 1500–7800)
Neutrophils Relative %: 42.4 %
Platelets: 222 10*3/uL (ref 140–400)
RBC: 5.3 10*6/uL (ref 4.20–5.80)
RDW: 13 % (ref 11.0–15.0)
Total Lymphocyte: 50.8 %
WBC: 5 10*3/uL (ref 3.8–10.8)

## 2020-06-13 LAB — COMPLETE METABOLIC PANEL WITH GFR
AG Ratio: 2.1 (calc) (ref 1.0–2.5)
ALT: 38 U/L (ref 9–46)
AST: 27 U/L (ref 10–40)
Albumin: 4.8 g/dL (ref 3.6–5.1)
Alkaline phosphatase (APISO): 38 U/L (ref 36–130)
BUN: 12 mg/dL (ref 7–25)
CO2: 27 mmol/L (ref 20–32)
Calcium: 9.6 mg/dL (ref 8.6–10.3)
Chloride: 101 mmol/L (ref 98–110)
Creat: 1.04 mg/dL (ref 0.60–1.35)
GFR, Est African American: 99 mL/min/{1.73_m2} (ref >=60)
GFR, Est Non African American: 85 mL/min/{1.73_m2} (ref >=60)
Globulin: 2.3 g/dL (calc) (ref 1.9–3.7)
Glucose, Bld: 86 mg/dL (ref 65–99)
Potassium: 4.1 mmol/L (ref 3.5–5.3)
Sodium: 137 mmol/L (ref 135–146)
Total Bilirubin: 0.4 mg/dL (ref 0.2–1.2)
Total Protein: 7.1 g/dL (ref 6.1–8.1)

## 2020-06-13 LAB — TSH: TSH: 1.75 mIU/L (ref 0.40–4.50)

## 2020-06-18 ENCOUNTER — Telehealth: Payer: Self-pay

## 2020-06-18 NOTE — Telephone Encounter (Signed)
Copied from CRM [id number]. Topic: General - Other >> Jun 14, 2020  4:18 PM Jaquita Rector A wrote: Reason for CRM:  Patient need a call back with lab results please call Ph# [phone]

## 2020-06-18 NOTE — Telephone Encounter (Signed)
Patient is calling back regarding his lab results. Please advise CB- [phone]

## 2020-06-19 NOTE — Telephone Encounter (Signed)
Spoke to patient regarding labs

## 2020-06-30 DIAGNOSIS — Z03818 Encounter for observation for suspected exposure to other biological agents ruled out: Secondary | ICD-10-CM | POA: Diagnosis not present

## 2020-06-30 DIAGNOSIS — Z20822 Contact with and (suspected) exposure to covid-19: Secondary | ICD-10-CM | POA: Diagnosis not present

## 2020-07-13 ENCOUNTER — Encounter: Payer: BC Managed Care – PPO | Admitting: Family Medicine

## 2020-08-09 ENCOUNTER — Other Ambulatory Visit: Payer: Self-pay

## 2020-08-09 ENCOUNTER — Encounter: Payer: Self-pay | Admitting: Family Medicine

## 2020-08-09 ENCOUNTER — Ambulatory Visit (INDEPENDENT_AMBULATORY_CARE_PROVIDER_SITE_OTHER): Payer: BC Managed Care – PPO | Admitting: Family Medicine

## 2020-08-09 VITALS — BP 126/78 | HR 73 | Temp 97.8°F | Resp 18 | Ht 70.0 in | Wt 258.8 lb

## 2020-08-09 DIAGNOSIS — Z125 Encounter for screening for malignant neoplasm of prostate: Secondary | ICD-10-CM

## 2020-08-09 DIAGNOSIS — N401 Enlarged prostate with lower urinary tract symptoms: Secondary | ICD-10-CM

## 2020-08-09 DIAGNOSIS — Z1211 Encounter for screening for malignant neoplasm of colon: Secondary | ICD-10-CM

## 2020-08-09 DIAGNOSIS — Z1159 Encounter for screening for other viral diseases: Secondary | ICD-10-CM | POA: Diagnosis not present

## 2020-08-09 DIAGNOSIS — E66812 Obesity, class 2: Secondary | ICD-10-CM

## 2020-08-09 DIAGNOSIS — E785 Hyperlipidemia, unspecified: Secondary | ICD-10-CM

## 2020-08-09 DIAGNOSIS — I10 Essential (primary) hypertension: Secondary | ICD-10-CM | POA: Diagnosis not present

## 2020-08-09 DIAGNOSIS — N138 Other obstructive and reflux uropathy: Secondary | ICD-10-CM | POA: Diagnosis not present

## 2020-08-09 DIAGNOSIS — R7303 Prediabetes: Secondary | ICD-10-CM | POA: Diagnosis not present

## 2020-08-09 DIAGNOSIS — Z0000 Encounter for general adult medical examination without abnormal findings: Secondary | ICD-10-CM | POA: Diagnosis not present

## 2020-08-09 DIAGNOSIS — Z5181 Encounter for therapeutic drug level monitoring: Secondary | ICD-10-CM

## 2020-08-09 DIAGNOSIS — Z6837 Body mass index (BMI) 37.0-37.9, adult: Secondary | ICD-10-CM

## 2020-08-09 NOTE — Progress Notes (Signed)
Patient: Jeffrey Rivers, Male    DOB: [birth year], 50 y.o.   MRN: [id number] Jeffrey Berry, PA-C Visit Date: 08/09/2020  Today's Provider: Danelle Berry, PA-C   Chief Complaint  Patient presents with  . Annual Exam   Subjective:   Annual physical exam:  Jeffrey Rivers is a 50 y.o. male who presents today for health maintenance and annual & complete physical exam.   Exercise/Activity:  Exercising 3d week for 30 min each Diet/nutrition:   Sleep: no problems  Pt wished to do routine f/up on chronic conditions today in addition to CPE. Advised pt of separate visit billing/coding  BPH LUTS - on tamsulosin 0.8 mg daily  IPSS below - no SE with meds  Hypertension:  Currently managed on lisinopril 10 mg and amlodipine 10 mg  Pt reports good med compliance and denies any SE.   Blood pressure today is well controlled. BP Readings from Last 3 Encounters:  08/09/20 126/78  06/12/20 120/82  02/27/20 (!) 141/96   Pt denies CP, SOB, exertional sx, LE edema, palpitation, Ha's, visual disturbances, lightheadedness, hypotension, syncope.  Hx of prediabetes -  Last labs  HGBA1C 6.1 (H) 06/12/2020   HGBA1C 5.9 (H) 02/25/2019  Not on meds - he would like to repeat labs today    USPSTF grade A and B recommendations - reviewed and addressed today  Depression:  Phq 9 completed today by patient, was reviewed by me with patient in the room, score is  negative, pt feels good PHQ 2/9 Scores 08/09/2020 06/12/2020 06/12/2020 01/24/2020  PHQ - 2 Score 0 1 1 0  PHQ- 9 Score - 6 - 0   Depression screen [facility] 2/9 08/09/2020 06/12/2020 06/12/2020 01/24/2020 10/24/2019  Decreased Interest 0 1 1 0 0  Down, Depressed, Hopeless 0 0 0 0 0  PHQ - 2 Score 0 1 1 0 0  Altered sleeping - 1 - 0 0  Tired, decreased energy - 1 - 0 0  Change in appetite - 0 - 0 0  Feeling bad or failure about yourself  - 1 - 0 0  Trouble concentrating - 1 - 0 0  Moving slowly or fidgety/restless - 1 - 0 0  Suicidal thoughts  - 0 - 0 0  PHQ-9 Score - 6 - 0 0  Difficult doing work/chores - - - Not difficult at all Not difficult at all    Hep C Screening: due STD testing and prevention (HIV/chl/gon/syphilis): done in the past, no new risk Intimate partner violence:feels safe  Prostate cancer:  Prostate cancer screening with PSA: Discussed risks and benefits of PSA testing and provided handout. Pt is going to have PSA drawn today.  Tx for BPH - no recent psa per pt  Lab Results  Component Value Date   PSA 0.4 02/02/2017     Urinary Symptoms:   Score high   IPSS    Row Name 08/09/20 1351         International Prostate Symptom Score   How often have you had the sensation of not emptying your bladder? More than half the time     How often have you had to urinate less than every two hours? About half the time     How often have you found you stopped and started again several times when you urinated? More than half the time     How often have you found it difficult to postpone urination? More than half the time  How often have you had a weak urinary stream? More than half the time     How often have you had to strain to start urination? More than half the time     How many times did you typically get up at night to urinate? 2 Times     Total IPSS Score 25           Quality of Life due to urinary symptoms   If you were to spend the rest of your life with your urinary condition just the way it is now how would you feel about that? Mostly Disatisfied           Advanced Care Planning:  A voluntary discussion about advance care planning including the explanation and discussion of advance directives.  Discussed health care proxy and Living will - given packet to complete  Health Maintenance  Topic Date Due  . Hepatitis C Screening  Never done  . COLONOSCOPY (Pts 45-20yrs Insurance coverage will need to be confirmed)  Never done  . TETANUS/TDAP  02/22/2022  . COVID-19 Vaccine  Completed  . HIV  Screening  Completed  . INFLUENZA VACCINE  Discontinued     Skin cancer:   Pt reports no hx of skin cancer, suspicious lesions/biopsies in the past.  Colorectal cancer:  colonoscopy is due   Pt denies melena hematochezia chagne in bowels  Lung cancer:   Low Dose CT Chest recommended if Age 48-80 years, 20 pack-year currently smoking OR have quit w/in 15years. Patient does not qualify.   Social History   Tobacco Use  . Smoking status: Never Smoker  . Smokeless tobacco: Never Used  Substance Use Topics  . Alcohol use: No     Alcohol screening: Flowsheet Row [facility] Visit from 08/09/2020 in [facility]  AUDIT-C Score 0      AAA: n/a The USPSTF recommends one-time screening with ultrasonography in men ages 65 to 38 years who have ever smoked  BXU:XYBF indicated today  Blood pressure/Hypertension: BP Readings from Last 3 Encounters:  08/09/20 126/78  06/12/20 120/82  02/27/20 (!) 141/96   Weight/Obesity: Wt Readings from Last 3 Encounters:  08/09/20 258 lb 12.8 oz (117.4 kg)  06/12/20 257 lb 1.6 oz (116.6 kg)  02/27/20 252 lb (114.3 kg)   BMI Readings from Last 3 Encounters:  08/09/20 37.13 kg/m  06/12/20 36.89 kg/m  02/27/20 36.16 kg/m    Lipids:  Lab Results  Component Value Date   CHOL 165 10/24/2019   CHOL 200 (H) 02/25/2019   CHOL 198 05/19/2018   Lab Results  Component Value Date   HDL 55 10/24/2019   HDL 54 02/25/2019   HDL 60 05/19/2018   Lab Results  Component Value Date   LDLCALC 96 10/24/2019   LDLCALC 131 (H) 02/25/2019   LDLCALC 121 (H) 05/19/2018   Lab Results  Component Value Date   TRIG 55 10/24/2019   TRIG 55 02/25/2019   TRIG 71 05/19/2018   Lab Results  Component Value Date   CHOLHDL 3.0 10/24/2019   CHOLHDL 3.7 02/25/2019   CHOLHDL 3.3 05/19/2018   No results found for: LDLDIRECT Based on the results of lipid panel his/her cardiovascular risk factor ( using [place] Cohort )  in the next 10 years is : The  10-year ASCVD risk score Denman George DC Jr., et al., 2013) is: 6.8%   Values used to calculate the score:     Age: 44 years     Sex:  Male     Is Non-Hispanic African American: Yes     Diabetic: No     Tobacco smoker: No     Systolic Blood Pressure: 126 mmHg     Is BP treated: Yes     HDL Cholesterol: 55 mg/dL     Total Cholesterol: 165 mg/dL Glucose:  Glucose, Bld  Date Value Ref Range Status  06/12/2020 86 65 - 99 mg/dL Final    Comment:    .            Fasting reference interval .   10/24/2019 97 65 - 99 mg/dL Final    Comment:    .            Fasting reference interval .   02/25/2019 95 65 - 99 mg/dL Final    Comment:    .            Fasting reference interval .     Social History      He  reports that he has never smoked. He has never used smokeless tobacco. He reports that he does not drink alcohol and does not use drugs.       Social History   Socioeconomic History  . Marital status: Married    Spouse name: Not on file  . Number of children: Not on file  . Years of education: Not on file  . Highest education level: Not on file  Occupational History  . Not on file  Tobacco Use  . Smoking status: Never Smoker  . Smokeless tobacco: Never Used  Vaping Use  . Vaping Use: Never used  Substance and Sexual Activity  . Alcohol use: No  . Drug use: No  . Sexual activity: Not Currently  Other Topics Concern  . Not on file  Social History Narrative  . Not on file   Social Determinants of Health   Financial Resource Strain: Low Risk   . Difficulty of Paying Living Expenses: Not hard at all  Food Insecurity: No Food Insecurity  . Worried About Programme researcher, broadcasting/film/video in the Last Year: Never true  . Ran Out of Food in the Last Year: Never true  Transportation Needs: No Transportation Needs  . Lack of Transportation (Medical): No  . Lack of Transportation (Non-Medical): No  Physical Activity: Insufficiently Active  . Days of Exercise per Week: 3 days  . Minutes of  Exercise per Session: 40 min  Stress: No Stress Concern Present  . Feeling of Stress : Only a little  Social Connections: Socially Integrated  . Frequency of Communication with Friends and Family: More than three times a week  . Frequency of Social Gatherings with Friends and Family: More than three times a week  . Attends Religious Services: More than 4 times per year  . Active Member of Clubs or Organizations: No  . Attends Banker Meetings: More than 4 times per year  . Marital Status: Married     Family History        Family Status  Relation Name Status  . Mother  Alive  . Father  Deceased  . Neg Hx  (Not Specified)        His family history includes Cancer (age of onset: 8) in his father. There is no history of Prostate cancer or Kidney cancer.       Family History  Problem Relation Age of Onset  . Cancer Father 69       colon  .  Prostate cancer Neg Hx   . Kidney cancer Neg Hx     Patient Active Problem List   Diagnosis Date Noted  . Low testosterone in male 06/12/2020  . History of snoring 06/12/2020  . Prediabetes 05/21/2018  . Class 2 severe obesity with serious comorbidity and body mass index (BMI) of 37.0 to 37.9 in adult (HCC) 03/22/2016  . Hyperlipidemia 03/17/2016  . Hesitancy 12/19/2015  . Benign prostatic hyperplasia with urinary obstruction 11/29/2015  . Essential hypertension, benign 11/05/2015  . LVH (left ventricular hypertrophy) 11/05/2015    Past Surgical History:  Procedure Laterality Date  . APPENDECTOMY    . HERNIA REPAIR Right 06/05/14     Current Outpatient Medications:  .  amLODipine (NORVASC) 10 MG tablet, Take 1 tablet (10 mg total) by mouth daily., Disp: 90 tablet, Rfl: 3 .  lisinopril (ZESTRIL) 10 MG tablet, Take 1 tablet (10 mg total) by mouth daily., Disp: 90 tablet, Rfl: 3 .  tamsulosin (FLOMAX) 0.4 MG CAPS capsule, Take 2 capsules (0.8 mg total) by mouth daily., Disp: 180 capsule, Rfl: 3  No Known  Allergies  Patient Care Team: Jeffrey Berry, PA-C as PCP - General (Family Medicine) Kieth Brightly, MD (General Surgery) Dennison Mascot, MD (Family Medicine)   Chart Review: I personally reviewed active problem list, medication list, allergies, family history, social history, health maintenance, notes from last encounter, lab results, imaging with the patient/caregiver today.   Review of Systems  Constitutional: Negative.  Negative for activity change, appetite change, fatigue and unexpected weight change.  HENT: Negative.   Eyes: Negative.   Respiratory: Negative.  Negative for shortness of breath.   Cardiovascular: Negative.  Negative for chest pain, palpitations and leg swelling.  Gastrointestinal: Negative.  Negative for abdominal pain and blood in stool.  Endocrine: Negative.   Genitourinary: Negative.  Negative for decreased urine volume, difficulty urinating, testicular pain and urgency.  Skin: Negative.  Negative for color change and pallor.  Allergic/Immunologic: Negative.   Neurological: Negative.  Negative for syncope, weakness, light-headedness and numbness.  Psychiatric/Behavioral: Negative.  Negative for confusion, dysphoric mood, self-injury and suicidal ideas. The patient is not nervous/anxious.   All other systems reviewed and are negative.         Objective:   Vitals:  Vitals:   08/09/20 1346  BP: 126/78  Pulse: 73  Resp: 18  Temp: 97.8 F (36.6 C)  TempSrc: Oral  SpO2: 98%  Weight: 258 lb 12.8 oz (117.4 kg)  Height: 5\' 10"  (1.778 m)    Body mass index is 37.13 kg/m.  Physical Exam Vitals and nursing note reviewed.  Constitutional:      General: He is not in acute distress.    Appearance: Normal appearance. He is well-developed. He is obese. He is not ill-appearing, toxic-appearing or diaphoretic.     Interventions: Face mask in place.  HENT:     Head: Normocephalic and atraumatic.     Jaw: No trismus.     Right Ear: External ear  normal.     Left Ear: External ear normal.  Eyes:     General: Lids are normal. No scleral icterus.       Right eye: No discharge.        Left eye: No discharge.     Conjunctiva/sclera: Conjunctivae normal.  Neck:     Trachea: Trachea and phonation normal. No tracheal deviation.  Cardiovascular:     Rate and Rhythm: Normal rate and regular rhythm.     Pulses:  Normal pulses.          Radial pulses are 2+ on the right side and 2+ on the left side.       Posterior tibial pulses are 2+ on the right side and 2+ on the left side.     Heart sounds: Normal heart sounds. No murmur heard. No friction rub. No gallop.   Pulmonary:     Effort: Pulmonary effort is normal. No respiratory distress.     Breath sounds: Normal breath sounds. No stridor. No wheezing, rhonchi or rales.  Abdominal:     General: Bowel sounds are normal. There is no distension.     Palpations: Abdomen is soft.  Musculoskeletal:     Cervical back: Normal range of motion.     Right lower leg: No edema.     Left lower leg: No edema.  Skin:    General: Skin is warm and dry.     Coloration: Skin is not jaundiced.     Findings: No rash.     Nails: There is no clubbing.  Neurological:     Mental Status: He is alert. Mental status is at baseline.     Cranial Nerves: No dysarthria or facial asymmetry.     Motor: No tremor or abnormal muscle tone.     Gait: Gait normal.  Psychiatric:        Mood and Affect: Mood normal.        Speech: Speech normal.        Behavior: Behavior normal. Behavior is cooperative.      Recent Results (from the past 2160 hour(s))  CBC with Differential/Platelet     Status: None   Collection Time: 06/12/20  2:34 PM  Result Value Ref Range   WBC 5.0 3.8 - 10.8 Thousand/uL   RBC 5.30 4.20 - 5.80 Million/uL   Hemoglobin 14.9 13.2 - 17.1 g/dL   HCT 40.9 81.1 - 91.4 %   MCV 81.1 80.0 - 100.0 fL   MCH 28.1 27.0 - 33.0 pg   MCHC 34.7 32.0 - 36.0 g/dL   RDW 78.2 95.6 - 21.3 %   Platelets 222  140 - 400 Thousand/uL   MPV 10.9 7.5 - 12.5 fL   Neutro Abs 2,120 1,500 - 7,800 cells/uL   Lymphs Abs 2,540 850 - 3,900 cells/uL   Absolute Monocytes 280 200 - 950 cells/uL   Eosinophils Absolute 40 15 - 500 cells/uL   Basophils Absolute 20 0 - 200 cells/uL   Neutrophils Relative % 42.4 %   Total Lymphocyte 50.8 %   Monocytes Relative 5.6 %   Eosinophils Relative 0.8 %   Basophils Relative 0.4 %  COMPLETE METABOLIC PANEL WITH GFR     Status: None   Collection Time: 06/12/20  2:34 PM  Result Value Ref Range   Glucose, Bld 86 65 - 99 mg/dL    Comment: .            Fasting reference interval .    BUN 12 7 - 25 mg/dL   Creat 0.86 5.78 - 4.69 mg/dL   GFR, Est Non African American 85 > OR = 60 mL/min/1.26m2   GFR, Est African American 99 > OR = 60 mL/min/1.78m2   BUN/Creatinine Ratio NOT APPLICABLE 6 - 22 (calc)   Sodium 137 135 - 146 mmol/L   Potassium 4.1 3.5 - 5.3 mmol/L   Chloride 101 98 - 110 mmol/L   CO2 27 20 - 32 mmol/L   Calcium 9.6 8.6 -  10.3 mg/dL   Total Protein 7.1 6.1 - 8.1 g/dL   Albumin 4.8 3.6 - 5.1 g/dL   Globulin 2.3 1.9 - 3.7 g/dL (calc)   AG Ratio 2.1 1.0 - 2.5 (calc)   Total Bilirubin 0.4 0.2 - 1.2 mg/dL   Alkaline phosphatase (APISO) 38 36 - 130 U/L   AST 27 10 - 40 U/L   ALT 38 9 - 46 U/L  Hemoglobin A1c     Status: Abnormal   Collection Time: 06/12/20  2:34 PM  Result Value Ref Range   Hgb A1c MFr Bld 6.1 (H) <5.7 % of total Hgb    Comment: For someone without known diabetes, a hemoglobin  A1c value between 5.7% and 6.4% is consistent with prediabetes and should be confirmed with a  follow-up test. . For someone with known diabetes, a value <7% indicates that their diabetes is well controlled. A1c targets should be individualized based on duration of diabetes, age, comorbid conditions, and other considerations. . This assay result is consistent with an increased risk of diabetes. . Currently, no consensus exists regarding use of hemoglobin  A1c for diagnosis of diabetes for children. .    Mean Plasma Glucose 128 mg/dL   eAG (mmol/L) 7.1 mmol/L  TSH     Status: None   Collection Time: 06/12/20  2:34 PM  Result Value Ref Range   TSH 1.75 0.40 - 4.50 mIU/L    Fall Risk: Fall Risk  08/09/2020 06/12/2020 01/24/2020 10/24/2019 02/18/2019  Falls in the past year? 0 0 0 0 0  Number falls in past yr: 0 0 0 0 0  Injury with Fall? 0 0 0 0 0  Follow up Falls evaluation completed Falls evaluation completed Falls evaluation completed - -    Functional Status Survey: Is the patient deaf or have difficulty hearing?: No Does the patient have difficulty seeing, even when wearing glasses/contacts?: No Does the patient have difficulty concentrating, remembering, or making decisions?: No Does the patient have difficulty walking or climbing stairs?: No Does the patient have difficulty dressing or bathing?: No Does the patient have difficulty doing errands alone such as visiting a doctor's office or shopping?: No   Assessment & Plan:    CPE completed today  . Prostate cancer screening and PSA options (with potential risks and benefits of testing vs not testing) were discussed along with recent recs/guidelines, shared decision making and handout/information given to pt today  . USPSTF grade A and B recommendations reviewed with patient; age-appropriate recommendations, preventive care, screening tests, etc discussed and encouraged; healthy living encouraged; see AVS for patient education given to patient  . Discussed importance of 150 minutes of physical activity weekly, AHA exercise recommendations given to pt in AVS/handout  . Discussed importance of healthy diet:  eating lean meats and proteins, avoiding trans fats and saturated fats, avoid simple sugars and excessive carbs in diet, eat 6 servings of fruit/vegetables daily and drink plenty of water and avoid sweet beverages.  DASH diet reviewed if pt has HTN  . Recommended pt to do annual  eye exam and routine dental exams/cleanings  . Reviewed Health Maintenance: Health Maintenance  Topic Date Due  . Hepatitis C Screening  Never done  . COLONOSCOPY (Pts 45-60yrs Insurance coverage will need to be confirmed)  Never done  . TETANUS/TDAP  02/22/2022  . COVID-19 Vaccine  Completed  . HIV Screening  Completed  . INFLUENZA VACCINE  Discontinued    . Immunizations: Immunization History  Administered Date(s)  Administered  . PFIZER(Purple Top)SARS-COV-2 Vaccination 08/25/2019, 09/15/2019, 05/24/2020     ICD-10-CM   1. Annual physical exam  Z00.00 CBC w/Diff/Platelet    Lipid panel    PSA    COMPLETE METABOLIC PANEL WITH GFR    Hemoglobin A1C  2. Prediabetes  R73.03 COMPLETE METABOLIC PANEL WITH GFR    Hemoglobin A1C  3. Class 2 severe obesity with serious comorbidity and body mass index (BMI) of 37.0 to 37.9 in adult, unspecified obesity type (HCC)  E66.01 Lipid panel   Z68.37 COMPLETE METABOLIC PANEL WITH GFR    Hemoglobin A1C  4. Essential hypertension, benign  I10 COMPLETE METABOLIC PANEL WITH GFR   stable, well controlled, BP at goal today on current meds - due for labs continue monitoring and working on diet/lifestyle  5. Hyperlipidemia, unspecified hyperlipidemia type  E78.5 Lipid panel    COMPLETE METABOLIC PANEL WITH GFR   ASCVD score high, not on statin, recheck labs and recalculate - discussed statins/management benefit vs SE/risks   6. Benign prostatic hyperplasia with urinary obstruction  N40.1 PSA   N13.8    IPSS score high, very symptomatic - PSA done today per pt request after discussion - f/up with urology for further eval/management  7. Screening for colon cancer  Z12.11 Ambulatory referral to Gastroenterology  8. Prostate cancer screening  Z12.5 PSA  9. Encounter for medication monitoring  Z51.81 CBC w/Diff/Platelet    Lipid panel    PSA    COMPLETE METABOLIC PANEL WITH GFR    Hemoglobin A1C  10. Encounter for hepatitis C screening test for low  risk patient  Z11.59 Hepatitis C antibody        Jeffrey BerryLeisa Tapia, PA-C 08/09/20 2:02 PM  [facility]

## 2020-08-09 NOTE — Patient Instructions (Addendum)
Health Maintenance  Topic Date Due  .  Hepatitis C: One time screening is recommended by Center for Disease Control  (CDC) for  adults born from [birth year] through 1963.   Never done  . Colon Cancer Screening  Never done  . Tetanus Vaccine  02/22/2022  . COVID-19 Vaccine  Completed  . HIV Screening  Completed  . Flu Shot  Discontinued      Preventive Care 14-48 Years Old, Male Preventive care refers to lifestyle choices and visits with your health care provider that can promote health and wellness. This includes:  A yearly physical exam. This is also called an annual wellness visit.  Regular dental and eye exams.  Immunizations.  Screening for certain conditions.  Healthy lifestyle choices, such as: ? Eating a healthy diet. ? Getting regular exercise. ? Not using drugs or products that contain nicotine and tobacco. ? Limiting alcohol use. What can I expect for my preventive care visit? Physical exam Your health care provider will check your:  Height and weight. These may be used to calculate your BMI (body mass index). BMI is a measurement that tells if you are at a healthy weight.  Heart rate and blood pressure.  Body temperature.  Skin for abnormal spots. Counseling Your health care provider may ask you questions about your:  Past medical problems.  Family's medical history.  Alcohol, tobacco, and drug use.  Emotional well-being.  Home life and relationship well-being.  Sexual activity.  Diet, exercise, and sleep habits.  Work and work Astronomer.  Access to firearms. What immunizations do I need? Vaccines are usually given at various ages, according to a schedule. Your health care provider will recommend vaccines for you based on your age, medical history, and lifestyle or other factors, such as travel or where you work.   What tests do I need? Blood tests  Lipid and cholesterol levels. These may be checked every 5 years, or more often if you are over 48  years old.  Hepatitis C test.  Hepatitis B test. Screening  Lung cancer screening. You may have this screening every year starting at age 53 if you have a 30-pack-year history of smoking and currently smoke or have quit within the past 15 years.  Prostate cancer screening. Recommendations will vary depending on your family history and other risks.  Genital exam to check for testicular cancer or hernias.  Colorectal cancer screening. ? All adults should have this screening starting at age 1 and continuing until age 32. ? Your health care provider may recommend screening at age 89 if you are at increased risk. ? You will have tests every 1-10 years, depending on your results and the type of screening test.  Diabetes screening. ? This is done by checking your blood sugar (glucose) after you have not eaten for a while (fasting). ? You may have this done every 1-3 years.  STD (sexually transmitted disease) testing, if you are at risk. Follow these instructions at home: Eating and drinking  Eat a diet that includes fresh fruits and vegetables, whole grains, lean protein, and low-fat dairy products.  Take vitamin and mineral supplements as recommended by your health care provider.  Do not drink alcohol if your health care provider tells you not to drink.  If you drink alcohol: ? Limit how much you have to 0-2 drinks a day. ? Be aware of how much alcohol is in your drink. In the [place] one drink equals one 12 oz bottle of beer (  355 mL), one 5 oz glass of wine (148 mL), or one 1 oz glass of hard liquor (44 mL).   Lifestyle  Take daily care of your teeth and gums. Brush your teeth every morning and night with fluoride toothpaste. Floss one time each day.  Stay active. Exercise for at least 30 minutes 5 or more days each week.  Do not use any products that contain nicotine or tobacco, such as cigarettes, e-cigarettes, and chewing tobacco. If you need help quitting, ask your health care  provider.  Do not use drugs.  If you are sexually active, practice safe sex. Use a condom or other form of protection to prevent STIs (sexually transmitted infections).  If told by your health care provider, take low-dose aspirin daily starting at age 23.  Find healthy ways to cope with stress, such as: ? Meditation, yoga, or listening to music. ? Journaling. ? Talking to a trusted person. ? Spending time with friends and family. Safety  Always wear your seat belt while driving or riding in a vehicle.  Do not drive: ? If you have been drinking alcohol. Do not ride with someone who has been drinking. ? When you are tired or distracted. ? While texting.  Wear a helmet and other protective equipment during sports activities.  If you have firearms in your house, make sure you follow all gun safety procedures. What's next?  Go to your health care provider once a year for an annual wellness visit.  Ask your health care provider how often you should have your eyes and teeth checked.  Stay up to date on all vaccines. This information is not intended to replace advice given to you by your health care provider. Make sure you discuss any questions you have with your health care provider. Document Revised: 03/01/2019 Document Reviewed: 05/27/2018 Elsevier Patient Education  2021 [employer].

## 2020-08-10 LAB — COMPLETE METABOLIC PANEL WITH GFR
AG Ratio: 2.1 (calc) (ref 1.0–2.5)
ALT: 41 U/L (ref 9–46)
AST: 25 U/L (ref 10–40)
Albumin: 4.6 g/dL (ref 3.6–5.1)
Alkaline phosphatase (APISO): 39 U/L (ref 36–130)
BUN: 15 mg/dL (ref 7–25)
CO2: 27 mmol/L (ref 20–32)
Calcium: 9.6 mg/dL (ref 8.6–10.3)
Chloride: 104 mmol/L (ref 98–110)
Creat: 1.04 mg/dL (ref 0.60–1.35)
GFR, Est African American: 99 mL/min/{1.73_m2} (ref >=60)
GFR, Est Non African American: 85 mL/min/{1.73_m2} (ref >=60)
Globulin: 2.2 g/dL (calc) (ref 1.9–3.7)
Glucose, Bld: 123 mg/dL — ABNORMAL HIGH (ref 65–99)
Potassium: 4 mmol/L (ref 3.5–5.3)
Sodium: 140 mmol/L (ref 135–146)
Total Bilirubin: 0.3 mg/dL (ref 0.2–1.2)
Total Protein: 6.8 g/dL (ref 6.1–8.1)

## 2020-08-10 LAB — CBC WITH DIFFERENTIAL/PLATELET
Absolute Monocytes: 189 cells/uL — ABNORMAL LOW (ref 200–950)
Basophils Absolute: 19 cells/uL (ref 0–200)
Basophils Relative: 0.5 %
Eosinophils Absolute: 59 cells/uL (ref 15–500)
Eosinophils Relative: 1.6 %
HCT: 41.5 % (ref 38.5–50.0)
Hemoglobin: 14.1 g/dL (ref 13.2–17.1)
Lymphs Abs: 1561 cells/uL (ref 850–3900)
MCH: 27.7 pg (ref 27.0–33.0)
MCHC: 34 g/dL (ref 32.0–36.0)
MCV: 81.5 fL (ref 80.0–100.0)
MPV: 11.3 fL (ref 7.5–12.5)
Monocytes Relative: 5.1 %
Neutro Abs: 1872 cells/uL (ref 1500–7800)
Neutrophils Relative %: 50.6 %
Platelets: 207 10*3/uL (ref 140–400)
RBC: 5.09 10*6/uL (ref 4.20–5.80)
RDW: 13 % (ref 11.0–15.0)
Total Lymphocyte: 42.2 %
WBC: 3.7 10*3/uL — ABNORMAL LOW (ref 3.8–10.8)

## 2020-08-10 LAB — LIPID PANEL
Cholesterol: 194 mg/dL (ref <200)
HDL: 49 mg/dL (ref >=40)
LDL Cholesterol (Calc): 119 mg/dL (calc) — ABNORMAL HIGH
Non-HDL Cholesterol (Calc): 145 mg/dL (calc) — ABNORMAL HIGH (ref <130)
Total CHOL/HDL Ratio: 4 (calc) (ref <5.0)
Triglycerides: 150 mg/dL — ABNORMAL HIGH (ref <150)

## 2020-08-10 LAB — HEMOGLOBIN A1C
Hgb A1c MFr Bld: 5.9 % of total Hgb — ABNORMAL HIGH (ref <5.7)
Mean Plasma Glucose: 123 mg/dL
eAG (mmol/L): 6.8 mmol/L

## 2020-08-10 LAB — HEPATITIS C ANTIBODY
Hepatitis C Ab: NONREACTIVE
SIGNAL TO CUT-OFF: 0.01 (ref <1.00)

## 2020-08-10 LAB — PSA: PSA: 0.39 ng/mL (ref <=4.0)

## 2020-08-20 ENCOUNTER — Encounter: Payer: Self-pay | Admitting: *Deleted

## 2020-08-28 ENCOUNTER — Encounter: Payer: Self-pay | Admitting: Family Medicine

## 2020-12-01 ENCOUNTER — Other Ambulatory Visit: Payer: Self-pay | Admitting: Family Medicine

## 2020-12-01 DIAGNOSIS — I10 Essential (primary) hypertension: Secondary | ICD-10-CM

## 2020-12-03 ENCOUNTER — Other Ambulatory Visit: Payer: Self-pay | Admitting: Emergency Medicine

## 2020-12-03 ENCOUNTER — Other Ambulatory Visit: Payer: Self-pay

## 2020-12-03 DIAGNOSIS — I10 Essential (primary) hypertension: Secondary | ICD-10-CM

## 2020-12-03 MED ORDER — LISINOPRIL 10 MG PO TABS: 10.0000 mg | ORAL_TABLET | Freq: Every day | ORAL | 1 refills | Status: DC

## 2021-02-07 ENCOUNTER — Other Ambulatory Visit: Payer: Self-pay

## 2021-02-07 ENCOUNTER — Ambulatory Visit: Payer: BC Managed Care – PPO | Admitting: Family Medicine

## 2021-02-07 ENCOUNTER — Encounter: Payer: Self-pay | Admitting: Family Medicine

## 2021-02-07 VITALS — BP 122/78 | HR 89 | Temp 97.7°F | Resp 16 | Ht 70.0 in | Wt 260.7 lb

## 2021-02-07 DIAGNOSIS — N401 Enlarged prostate with lower urinary tract symptoms: Secondary | ICD-10-CM

## 2021-02-07 DIAGNOSIS — Z6837 Body mass index (BMI) 37.0-37.9, adult: Secondary | ICD-10-CM

## 2021-02-07 DIAGNOSIS — Z1211 Encounter for screening for malignant neoplasm of colon: Secondary | ICD-10-CM

## 2021-02-07 DIAGNOSIS — Z5181 Encounter for therapeutic drug level monitoring: Secondary | ICD-10-CM

## 2021-02-07 DIAGNOSIS — R350 Frequency of micturition: Secondary | ICD-10-CM

## 2021-02-07 DIAGNOSIS — I10 Essential (primary) hypertension: Secondary | ICD-10-CM | POA: Diagnosis not present

## 2021-02-07 DIAGNOSIS — R7303 Prediabetes: Secondary | ICD-10-CM | POA: Diagnosis not present

## 2021-02-07 DIAGNOSIS — R351 Nocturia: Secondary | ICD-10-CM

## 2021-02-07 DIAGNOSIS — I517 Cardiomegaly: Secondary | ICD-10-CM | POA: Diagnosis not present

## 2021-02-07 DIAGNOSIS — E782 Mixed hyperlipidemia: Secondary | ICD-10-CM | POA: Diagnosis not present

## 2021-02-07 DIAGNOSIS — R7989 Other specified abnormal findings of blood chemistry: Secondary | ICD-10-CM

## 2021-02-07 DIAGNOSIS — N138 Other obstructive and reflux uropathy: Secondary | ICD-10-CM

## 2021-02-07 NOTE — Patient Instructions (Signed)
Urinary Frequency, Adult Urinary frequency means urinating more often than usual. You may urinate every 1-2 hours even though you drink a normal amount of fluid and do not have a bladder infection or condition. Although you urinate more often than normal,the total amount of urine produced in a day is normal. With urinary frequency, you may have an urgent need to urinate often. The stress and anxiety of needing to find a bathroom quickly can make this urge worse. This condition may go away on its own or you may need treatment at home. Home treatment may include bladder training, exercises, taking medicines, ormaking changes to your diet. Follow these instructions at home: Bladder health  Keep a bladder diary if told by your health care provider. Keep track of: What you eat and drink. How often you urinate. How much you urinate. Follow a bladder training program if told by your health care provider. This may include: Learning to delay going to the bathroom. Double urinating (voiding). This helps if you are not completely emptying your bladder. Scheduled voiding. Do Kegel exercises as told by your health care provider. Kegel exercises strengthen the muscles that help control urination, which may help the condition.  Eating and drinking If told by your health care provider, make diet changes, such as: Avoiding caffeine. Drinking fewer fluids, especially alcohol. Not drinking in the evening. Avoiding foods or drinks that may irritate the bladder. These include coffee, tea, soda, artificial sweeteners, citrus, tomato-based foods, and chocolate. Eating foods that help prevent or ease constipation. Constipation can make this condition worse. Your health care provider may recommend that you: Drink enough fluid to keep your urine pale yellow. Take over-the-counter or prescription medicines. Eat foods that are high in fiber, such as beans, whole grains, and fresh fruits and vegetables. Limit foods  that are high in fat and processed sugars, such as fried or sweet foods. General instructions Take over-the-counter and prescription medicines only as told by your health care provider. Keep all follow-up visits as told by your health care provider. This is important. Contact a health care provider if: You start urinating more often. You feel pain or irritation when you urinate. You notice blood in your urine. Your urine looks cloudy. You develop a fever. You begin vomiting. Get help right away if: You are unable to urinate. Summary Urinary frequency means urinating more often than usual. With urinary frequency, you may urinate every 1-2 hours even though you drink a normal amount of fluid and do not have a bladder infection or other bladder condition. Your health care provider may recommend that you keep a bladder diary, follow a bladder training program, or make dietary changes. If told by your health care provider, do Kegel exercises to strengthen the muscles that help control urination. Take over-the-counter and prescription medicines only as told by your health care provider. Contact a health care provider if your symptoms do not improve or get worse. This information is not intended to replace advice given to you by your health care provider. Make sure you discuss any questions you have with your healthcare provider. Document Revised: 12/10/2017 Document Reviewed: 12/10/2017 Elsevier Patient Education  2021 Elsevier Inc.  

## 2021-02-07 NOTE — Progress Notes (Signed)
Name: Jeffrey Rivers   MRN: [id number]    DOB: Feb 21, 1971   Date:02/07/2021       Progress Note  Chief Complaint  Patient presents with   Hypertension   Hyperlipidemia     Subjective:   Jeffrey Rivers is a 50 y.o. male, presents to clinic for routine f/up  HLD not on meds, plan was to recalculate ASCVD and advise on starting statin  The 10-year ASCVD risk score Jeffrey Rivers., et al., 2013) is: 7.3%   Values used to calculate the score:     Age: 8 years     Sex: Male     Is Non-Hispanic African American: Yes     Diabetic: No     Tobacco smoker: No     Systolic Blood Pressure: 122 mmHg     Is BP treated: Yes     HDL Cholesterol: 49 mg/dL     Total Cholesterol: 194 mg/dL  Hypertension:  Currently managed on lisinopril and amlodipine  Pt reports good med compliance and denies any SE.   Blood pressure today is well controlled. BP Readings from Last 3 Encounters:  02/07/21 122/78  08/09/20 126/78  06/12/20 120/82   Pt denies CP, SOB, exertional sx, LE edema, palpitation, Ha's, visual disturbances, lightheadedness, hypotension, syncope.   Prediabetes  Denies: Polyuria, polydipsia, vision changes, neuropathy, hypoglycemia Recent pertinent labs: Lab Results  Component Value Date   HGBA1C 5.9 (H) 08/09/2020   HGBA1C 6.1 (H) 06/12/2020   HGBA1C 5.9 (H) 02/25/2019   Lab Results  Component Value Date   LDLCALC 119 (H) 08/09/2020   CREATININE 1.04 08/09/2020   BPH on flomax Lab Results  Component Value Date   PSA1 0.5 11/05/2015   PSA 0.39 08/09/2020   PSA 0.4 02/02/2017    Obesity- weight increased Wt Readings from Last 5 Encounters:  02/07/21 260 lb 11.2 oz (118.3 kg)  08/09/20 258 lb 12.8 oz (117.4 kg)  06/12/20 257 lb 1.6 oz (116.6 kg)  02/27/20 252 lb (114.3 kg)  01/24/20 253 lb 6.4 oz (114.9 kg)   BMI Readings from Last 5 Encounters:  02/07/21 37.41 kg/m  08/09/20 37.13 kg/m  06/12/20 36.89 kg/m  02/27/20 36.16 kg/m  01/24/20 36.36 kg/m     Acute complaints increased urinary sx going 5x at night, previously was on average going once a night        Current Outpatient Medications:    amLODipine (NORVASC) 10 MG tablet, TAKE 1 TABLET(10 MG) BY MOUTH DAILY, Disp: 90 tablet, Rfl: 3   lisinopril (ZESTRIL) 10 MG tablet, Take 1 tablet (10 mg total) by mouth daily., Disp: 90 tablet, Rfl: 1   tamsulosin (FLOMAX) 0.4 MG CAPS capsule, Take 2 capsules (0.8 mg total) by mouth daily., Disp: 180 capsule, Rfl: 3  Patient Active Problem List   Diagnosis Date Noted   Low testosterone in male 06/12/2020   History of snoring 06/12/2020   Prediabetes 05/21/2018   Class 2 severe obesity with serious comorbidity and body mass index (BMI) of 37.0 to 37.9 in adult [facility]) 03/22/2016   Hyperlipidemia 03/17/2016   Hesitancy 12/19/2015   Benign prostatic hyperplasia with urinary obstruction 11/29/2015   Essential hypertension, benign 11/05/2015   LVH (left ventricular hypertrophy) 11/05/2015    Past Surgical History:  Procedure Laterality Date   APPENDECTOMY     HERNIA REPAIR Right 06/05/14    Family History  Problem Relation Age of Onset   Cancer Father 14  colon   Prostate cancer Neg Hx    Kidney cancer Neg Hx     Social History   Tobacco Use   Smoking status: Never   Smokeless tobacco: Never  Vaping Use   Vaping Use: Never used  Substance Use Topics   Alcohol use: No   Drug use: No     No Known Allergies  Health Maintenance  Topic Date Due   COLONOSCOPY (Pts 45-27yrs Insurance coverage will need to be confirmed)  Never done   TETANUS/TDAP  02/22/2022   COVID-19 Vaccine  Completed   Hepatitis C Screening  Completed   HIV Screening  Completed   Pneumococcal Vaccine 34-47 Years old  Aged Out   HPV VACCINES  Aged Out   INFLUENZA VACCINE  Discontinued    Chart Review Today: I personally reviewed active problem list, medication list, allergies, family history, social history, health maintenance, notes from last  encounter, lab results, imaging with the patient/caregiver today.   Review of Systems  Constitutional: Negative.   HENT: Negative.    Eyes: Negative.   Respiratory: Negative.    Cardiovascular: Negative.   Gastrointestinal: Negative.   Endocrine: Negative.   Genitourinary: Negative.   Musculoskeletal: Negative.   Skin: Negative.   Allergic/Immunologic: Negative.   Neurological: Negative.   Hematological: Negative.   Psychiatric/Behavioral: Negative.    All other systems reviewed and are negative.     Objective:   Vitals:   02/07/21 1505  BP: 122/78  Pulse: 89  Resp: 16  Temp: 97.7 F (36.5 C)  SpO2: 98%  Weight: 260 lb 11.2 oz (118.3 kg)  Height: 5\' 10"  (1.778 m)    Body mass index is 37.41 kg/m.  Physical Exam Vitals and nursing note reviewed.  Constitutional:      General: He is not in acute distress.    Appearance: Normal appearance. He is well-developed. He is obese. He is not ill-appearing, toxic-appearing or diaphoretic.     Interventions: Face mask in place.  HENT:     Head: Normocephalic and atraumatic.     Jaw: No trismus.     Right Ear: External ear normal.     Left Ear: External ear normal.  Eyes:     General: Lids are normal. No scleral icterus.       Right eye: No discharge.        Left eye: No discharge.     Conjunctiva/sclera: Conjunctivae normal.  Neck:     Trachea: Trachea and phonation normal. No tracheal deviation.  Cardiovascular:     Rate and Rhythm: Normal rate and regular rhythm.     Pulses: Normal pulses.          Radial pulses are 2+ on the right side and 2+ on the left side.       Posterior tibial pulses are 2+ on the right side and 2+ on the left side.     Heart sounds: Normal heart sounds. No murmur heard.   No friction rub. No gallop.  Pulmonary:     Effort: Pulmonary effort is normal. No respiratory distress.     Breath sounds: Normal breath sounds. No stridor. No wheezing, rhonchi or rales.  Abdominal:     General:  Bowel sounds are normal. There is no distension.     Palpations: Abdomen is soft.  Musculoskeletal:     Cervical back: Normal range of motion.     Right lower leg: No edema.     Left lower leg: No edema.  Skin:    General: Skin is warm and dry.     Coloration: Skin is not jaundiced.     Findings: No rash.     Nails: There is no clubbing.  Neurological:     Mental Status: He is alert. Mental status is at baseline.     Cranial Nerves: No dysarthria or facial asymmetry.     Motor: No tremor or abnormal muscle tone.     Gait: Gait normal.  Psychiatric:        Mood and Affect: Mood normal.        Speech: Speech normal.        Behavior: Behavior normal. Behavior is cooperative.        Assessment & Plan:     ICD-10-CM   1. Essential hypertension, benign  I10 COMPLETE METABOLIC PANEL WITH GFR   BP at goal today, stable, well controlled, continue meds and DASH    2. LVH (left ventricular hypertrophy)  I51.7 COMPLETE METABOLIC PANEL WITH GFR   no current sx of CHF/fluid overload    3. Mixed hyperlipidemia  E78.2 COMPLETE METABOLIC PANEL WITH GFR    Lipid panel   on statin, good compliance, no SE or concerns, due for labs    4. Class 2 severe obesity with serious comorbidity and body mass index (BMI) of 37.0 to 37.9 in adult, unspecified obesity type (HCC)  E66.01 COMPLETE METABOLIC PANEL WITH GFR   Z68.37 Lipid panel    Hemoglobin A1c    5. Prediabetes  R73.03 COMPLETE METABOLIC PANEL WITH GFR    Hemoglobin A1c    6. Encounter for medication monitoring  Z51.81 COMPLETE METABOLIC PANEL WITH GFR    Lipid panel    Hemoglobin A1c    Ambulatory referral to Urology    7. Screening for malignant neoplasm of colon  Z12.11 Ambulatory referral to Gastroenterology    8. Urinary frequency  R35.0 Ambulatory referral to Urology    9. Nocturia  R35.1 Ambulatory referral to Urology    10. Benign prostatic hyperplasia with urinary obstruction  N40.1 Ambulatory referral to Urology    N13.8     11. Low testosterone in male  R79.89 Ambulatory referral to Urology    Pt needs urology f/up   No follow-ups on file.   Danelle Berry, PA-C 02/07/21 3:16 PM

## 2021-02-08 LAB — COMPLETE METABOLIC PANEL WITH GFR
AG Ratio: 1.9 (calc) (ref 1.0–2.5)
ALT: 28 U/L (ref 9–46)
AST: 24 U/L (ref 10–40)
Albumin: 4.3 g/dL (ref 3.6–5.1)
Alkaline phosphatase (APISO): 36 U/L (ref 36–130)
BUN: 15 mg/dL (ref 7–25)
CO2: 26 mmol/L (ref 20–32)
Calcium: 9.2 mg/dL (ref 8.6–10.3)
Chloride: 106 mmol/L (ref 98–110)
Creat: 1 mg/dL (ref 0.60–1.29)
Globulin: 2.3 g/dL (calc) (ref 1.9–3.7)
Glucose, Bld: 109 mg/dL — ABNORMAL HIGH (ref 65–99)
Potassium: 3.7 mmol/L (ref 3.5–5.3)
Sodium: 141 mmol/L (ref 135–146)
Total Bilirubin: 0.4 mg/dL (ref 0.2–1.2)
Total Protein: 6.6 g/dL (ref 6.1–8.1)
eGFR: 93 mL/min/{1.73_m2} (ref >=60)

## 2021-02-08 LAB — HEMOGLOBIN A1C
Hgb A1c MFr Bld: 5.8 % of total Hgb — ABNORMAL HIGH (ref <5.7)
Mean Plasma Glucose: 120 mg/dL
eAG (mmol/L): 6.6 mmol/L

## 2021-02-08 LAB — LIPID PANEL
Cholesterol: 199 mg/dL (ref <200)
HDL: 54 mg/dL (ref >=40)
LDL Cholesterol (Calc): 127 mg/dL (calc) — ABNORMAL HIGH
Non-HDL Cholesterol (Calc): 145 mg/dL (calc) — ABNORMAL HIGH (ref <130)
Total CHOL/HDL Ratio: 3.7 (calc) (ref <5.0)
Triglycerides: 85 mg/dL (ref <150)

## 2021-02-25 ENCOUNTER — Encounter: Payer: Self-pay | Admitting: Family Medicine

## 2021-02-27 ENCOUNTER — Other Ambulatory Visit: Payer: Self-pay

## 2021-02-27 NOTE — Progress Notes (Signed)
Letter will be mailed out to schedule colonoscopy.

## 2021-03-04 ENCOUNTER — Other Ambulatory Visit: Payer: Self-pay

## 2021-03-04 ENCOUNTER — Encounter: Payer: Self-pay | Admitting: Urology

## 2021-03-04 ENCOUNTER — Telehealth: Payer: Self-pay

## 2021-03-04 ENCOUNTER — Ambulatory Visit: Payer: BC Managed Care – PPO | Admitting: Urology

## 2021-03-04 VITALS — BP 153/91 | HR 80 | Ht 70.0 in | Wt 260.0 lb

## 2021-03-04 DIAGNOSIS — R350 Frequency of micturition: Secondary | ICD-10-CM

## 2021-03-04 MED ORDER — TAMSULOSIN HCL 0.4 MG PO CAPS: 0.8000 mg | ORAL_CAPSULE | Freq: Every day | ORAL | 0 refills | Status: DC

## 2021-03-04 NOTE — Telephone Encounter (Signed)
Refill request from [facility]. Pt has appt scheduled today. Will refill when pt comes to clinic.

## 2021-03-04 NOTE — Progress Notes (Signed)
03/04/2021 3:31 PM   Jeffrey Rivers 06-10-1971 [id number]  Referring provider: Danelle Berry, PA-C [address] [place],  [place] [address]  No chief complaint on file.   HPI: 2017: Dr Jeffrey Rivers: Patient returns with h/o BPH and symptoms of weak stream, frequency, urgency, nocturia, intermittent flow, hesitancy and straining to void. He's had symptoms for several months. He tried tamsulosin which improved his symptoms but gave him dizziness. He denies trouble getting or maintaining an erection. He is most bothered hesitancy and weak stream which follows an urge to void. His PVR was 0 ml.     He tried alfuzosin but it made him feel drowsy and gave him heart palpitations and he stopped. Of note it did improve his weak stream.  The patient had cystoscopy noting a prominent obstructing median lobe.  He was counseled about transurethral resection versus other procedures and was given a trial of Rapaflo   I believe the patient is back on Flomax.  Flow is much better.  Nocturia grade 2.  Clinically no infections.  No blood in the urine  Patient's flow was getting slower.  He hesitates.  On times he stops and starts and does not feel 90.  Sometimes he sits   40 g benign prostate bed based on body habitus gland is more difficult to feel.    Patient has flow symptoms.  In 2017 he spoke to Dr. Mena Rivers regarding transurethral resection of prostate and other procedures and watchful waiting.  His gland was not that large but he had what appeared to be an obstructing middle lobe.  The role of urodynamics in a young man with voiding dysfunction discussed.  Pathophysiology of slow flow discussed.  He understands I would not aggressively manage his nighttime frequency currently independent of his prostate since his flow symptoms are quite significant.  He want to try Flomax 0.8 mg a day and come back in a year.  He gets his PSA done by his primary care doctor.  His symptoms date back approximately 3  years which is young for benign prostatic hyperplasia   Flow is stable and reasonable.  Still gets up 2 or 3 times a night.  We talked about decreased libido but we did not order testosterone.  Questions answered.  83-month supply and 3 refills sent    Today Jeffrey Rivers stable.  PSA approximately 1 year ago was 0.39 Bladder gets a bit urgent which is a nuisance.  He rather not try another medicine.  We talked about pursuing urodynamics and potential surgical type procedures but he does not want to proceed with this.  He understands the importance of getting a PSA recognizing that sometimes it is hard to feel a prostate based on body habitus  He still gets up twice at night.  He said if he stops the Flomax his flow was very slow.  The urgency he rarely leaks and does not wear a pad.    PMH: Past Medical History:  Diagnosis Date   Essential hypertension, benign 11/05/2015   Hypertension     Surgical History: Past Surgical History:  Procedure Laterality Date   APPENDECTOMY     HERNIA REPAIR Right 06/05/14    Home Medications:  Allergies as of 03/04/2021   No Known Allergies      Medication List        Accurate as of March 04, 2021  3:31 PM. If you have any questions, ask your nurse or doctor.  amLODipine 10 MG tablet Commonly known as: NORVASC TAKE 1 TABLET(10 MG) BY MOUTH DAILY   lisinopril 10 MG tablet Commonly known as: ZESTRIL Take 1 tablet (10 mg total) by mouth daily.   tamsulosin 0.4 MG Caps capsule Commonly known as: FLOMAX Take 2 capsules (0.8 mg total) by mouth daily.        Allergies: No Known Allergies  Family History: Family History  Problem Relation Age of Onset   Cancer Father 49       colon   Prostate cancer Neg Hx    Kidney cancer Neg Hx     Social History:  reports that he has never smoked. He has never used smokeless tobacco. He reports that he does not drink alcohol and does not use drugs.  ROS:                                         Physical Exam: There were no vitals taken for this visit.  Constitutional:  Alert and oriented, No acute distress. HEENT:  AT, moist mucus membranes.  Trachea midline, no masses.   Laboratory Data: Lab Results  Component Value Date   WBC 3.7 (L) 08/09/2020   HGB 14.1 08/09/2020   HCT 41.5 08/09/2020   MCV 81.5 08/09/2020   PLT 207 08/09/2020    Lab Results  Component Value Date   CREATININE 1.00 02/07/2021    Lab Results  Component Value Date   PSA 0.39 08/09/2020   PSA 0.4 02/02/2017    No results found for: TESTOSTERONE  Lab Results  Component Value Date   HGBA1C 5.8 (H) 02/07/2021    Urinalysis    Component Value Date/Time   APPEARANCEUR Clear 02/14/2019 0841   GLUCOSEU Negative 02/14/2019 0841   BILIRUBINUR Negative 02/14/2019 0841   PROTEINUR Negative 02/14/2019 0841   NITRITE Negative 02/14/2019 0841   LEUKOCYTESUR Negative 02/14/2019 0841    Pertinent Imaging:   Assessment & Plan: Prescription renewed with 11 refills.  I will see in 1 year.    There are no diagnoses linked to this encounter.  No follow-ups on file.  Jeffrey Sinner, MD  [facility] [address], [address] [place], [place] [address] [phone]

## 2021-04-18 ENCOUNTER — Other Ambulatory Visit: Payer: Self-pay

## 2021-04-18 MED ORDER — TAMSULOSIN HCL 0.4 MG PO CAPS: 0.8000 mg | ORAL_CAPSULE | Freq: Every day | ORAL | 11 refills | Status: DC

## 2021-06-13 ENCOUNTER — Encounter: Payer: Self-pay | Admitting: Internal Medicine

## 2021-06-13 ENCOUNTER — Ambulatory Visit: Payer: BC Managed Care – PPO | Admitting: Internal Medicine

## 2021-06-13 VITALS — BP 138/92 | HR 95 | Temp 98.2°F | Resp 16 | Ht 70.0 in | Wt 261.8 lb

## 2021-06-13 DIAGNOSIS — E782 Mixed hyperlipidemia: Secondary | ICD-10-CM

## 2021-06-13 DIAGNOSIS — R3911 Hesitancy of micturition: Secondary | ICD-10-CM

## 2021-06-13 DIAGNOSIS — I10 Essential (primary) hypertension: Secondary | ICD-10-CM

## 2021-06-13 DIAGNOSIS — Z1211 Encounter for screening for malignant neoplasm of colon: Secondary | ICD-10-CM

## 2021-06-13 DIAGNOSIS — R7303 Prediabetes: Secondary | ICD-10-CM | POA: Diagnosis not present

## 2021-06-13 NOTE — Patient Instructions (Signed)
It was great seeing you today!  Plan discussed at today's visit: -Work on lifestyle changes, being in a calorie deficit and increasing cardiovascular exercise to 30 minutes 5 x a week -Continue blood pressure medications, recommend checking blood pressure 2-3 times a week and bringing this to our next appointment  -Colonoscopy ordered today   Follow up in: 6 months   Take care and let us know if you have any questions or concerns prior to your next visit.  Dr. Caralee Ates

## 2021-06-13 NOTE — Progress Notes (Signed)
Established Patient Office Visit  Subjective:  Patient ID: Jeffrey Rivers, male    DOB: 04/03/1971  Age: 50 y.o. MRN: [id number]  CC:  Chief Complaint  Patient presents with   Follow-up   Hypertension    HPI Jeffrey Rivers presents for follow up on chronic medical conditions.  Hypertension: -Medications: Lisinopril 10, Amlodipine 10 -Patient is mostly compliant with above medications and reports no side effects. Sometimes forgets to take medications, forgot to take yesterday.  -Checking BP at home (average): not checking -Denies any SOB, CP, vision changes, LE edema or symptoms of hypotension  HLD: -Medications: None -Last lipid panel: 8/22: TC 199, HDL 54, triglycerides 85, LDL 127  The 10-year ASCVD risk score (Arnett DK, et al., 2019) is: 10.8%   Values used to calculate the score:     Age: 23 years     Sex: Male     Is Non-Hispanic African American: Yes     Diabetic: No     Tobacco smoker: No     Systolic Blood Pressure: 248 mmHg     Is BP treated: Yes     HDL Cholesterol: 54 mg/dL     Total Cholesterol: 199 mg/dL  Pre-Diabetes: -Last A1c: 8/22 5.8% -Not currently on medications  BPH: -Currently on Flomax, doing well   Health Maintenance: -Colon cancer screening: due, family history of colon cancer in father  Past Medical History:  Diagnosis Date   Essential hypertension, benign 11/05/2015   Hypertension     Past Surgical History:  Procedure Laterality Date   APPENDECTOMY     HERNIA REPAIR Right 06/05/14    Family History  Problem Relation Age of Onset   Cancer Father 45       colon   Prostate cancer Neg Hx    Kidney cancer Neg Hx     Social History   Socioeconomic History   Marital status: Married    Spouse name: Not on file   Number of children: Not on file   Years of education: Not on file   Highest education level: Not on file  Occupational History   Not on file  Tobacco Use   Smoking status: Never   Smokeless tobacco: Never   Vaping Use   Vaping Use: Never used  Substance and Sexual Activity   Alcohol use: No   Drug use: No   Sexual activity: Not Currently  Other Topics Concern   Not on file  Social History Narrative   Not on file   Social Determinants of Health   Financial Resource Strain: Low Risk    Difficulty of Paying Living Expenses: Not hard at all  Food Insecurity: No Food Insecurity   Worried About Charity fundraiser in the Last Year: Never true   Arboriculturist in the Last Year: Never true  Transportation Needs: No Transportation Needs   Lack of Transportation (Medical): No   Lack of Transportation (Non-Medical): No  Physical Activity: Insufficiently Active   Days of Exercise per Week: 3 days   Minutes of Exercise per Session: 40 min  Stress: No Stress Concern Present   Feeling of Stress : Only a little  Social Connections: Engineer, building services of Communication with Friends and Family: More than three times a week   Frequency of Social Gatherings with Friends and Family: More than three times a week   Attends Religious Services: More than 4 times per year   Active Member of Clubs or  Organizations: No   Attends Music therapist: More than 4 times per year   Marital Status: Married  Human resources officer Violence: Not At Risk   Fear of Current or Ex-Partner: No   Emotionally Abused: No   Physically Abused: No   Sexually Abused: No    Outpatient Medications Prior to Visit  Medication Sig Dispense Refill   amLODipine (NORVASC) 10 MG tablet TAKE 1 TABLET(10 MG) BY MOUTH DAILY 90 tablet 3   lisinopril (ZESTRIL) 10 MG tablet Take 1 tablet (10 mg total) by mouth daily. 90 tablet 1   tamsulosin (FLOMAX) 0.4 MG CAPS capsule Take 2 capsules (0.8 mg total) by mouth daily. 60 capsule 11   No facility-administered medications prior to visit.    No Known Allergies  ROS Review of Systems  Constitutional:  Negative for chills and fever.  Eyes:  Negative for visual  disturbance.  Respiratory:  Negative for cough and shortness of breath.   Cardiovascular:  Negative for chest pain and palpitations.  Gastrointestinal:  Negative for abdominal pain, nausea and vomiting.  Genitourinary:  Positive for difficulty urinating. Negative for dysuria, frequency and hematuria.  Skin: Negative.   Neurological:  Negative for dizziness and headaches.     Objective:    Physical Exam Constitutional:      Appearance: Normal appearance.  HENT:     Head: Normocephalic and atraumatic.  Eyes:     Conjunctiva/sclera: Conjunctivae normal.  Cardiovascular:     Rate and Rhythm: Normal rate and regular rhythm.  Pulmonary:     Effort: Pulmonary effort is normal.     Breath sounds: Normal breath sounds.  Musculoskeletal:     Right lower leg: No edema.     Left lower leg: No edema.  Skin:    General: Skin is warm and dry.  Neurological:     General: No focal deficit present.     Mental Status: He is alert. Mental status is at baseline.  Psychiatric:        Mood and Affect: Mood normal.        Behavior: Behavior normal.    BP (!) 138/92    Pulse 95    Temp 98.2 F (36.8 C)    Resp 16    Ht _0  (1.778 m)    Wt 261 lb 12.8 oz (118.8 kg)    SpO2 98%    BMI 37.56 kg/m  Wt Readings from Last 3 Encounters:  03/04/21 260 lb (117.9 kg)  02/07/21 260 lb 11.2 oz (118.3 kg)  08/09/20 258 lb 12.8 oz (117.4 kg)     Health Maintenance Due  Topic Date Due   COLONOSCOPY (Pts 45-73yr Insurance coverage will need to be confirmed)  Never done   COVID-19 Vaccine (4 - Booster for [employer]) 07/19/2020    There are no preventive care reminders to display for this patient.  Lab Results  Component Value Date   TSH 1.75 06/12/2020   Lab Results  Component Value Date   WBC 3.7 (L) 08/09/2020   HGB 14.1 08/09/2020   HCT 41.5 08/09/2020   MCV 81.5 08/09/2020   PLT 207 08/09/2020   Lab Results  Component Value Date   NA 141 02/07/2021   K 3.7 02/07/2021   CO2 26  02/07/2021   GLUCOSE 109 (H) 02/07/2021   BUN 15 02/07/2021   CREATININE 1.00 02/07/2021   BILITOT 0.4 02/07/2021   ALKPHOS 43 02/02/2017   AST 24 02/07/2021   ALT 28 02/07/2021  PROT 6.6 02/07/2021   ALBUMIN 4.3 02/02/2017   CALCIUM 9.2 02/07/2021   ANIONGAP 6 08/23/2015   EGFR 93 02/07/2021   Lab Results  Component Value Date   CHOL 199 02/07/2021   Lab Results  Component Value Date   HDL 54 02/07/2021   Lab Results  Component Value Date   LDLCALC 127 (H) 02/07/2021   Lab Results  Component Value Date   TRIG 85 02/07/2021   Lab Results  Component Value Date   CHOLHDL 3.7 02/07/2021   Lab Results  Component Value Date   HGBA1C 5.8 (H) 02/07/2021      Assessment & Plan:   1. Essential hypertension, benign: Stable, continue medications regularly. Work on compliance and checking blood pressure at home. Follow up in 6 months.   2. Mixed hyperlipidemia: Stable, reviewed last lipid panel. Work on lifestyle changes, weight loss. ASCVD risk about 10%. Recheck lipids in 6 months, may need a statin at that time.   3. Prediabetes: A1c stable.   4. Colon cancer screening: Colonoscopy ordered.   - Ambulatory referral to Gastroenterology  5. Hesitancy: Stable, continue Flomax.   Follow-up: Return in about 6 months (around 12/12/2021).    Teodora Medici, DO

## 2021-06-18 ENCOUNTER — Telehealth: Payer: Self-pay

## 2021-06-18 NOTE — Telephone Encounter (Signed)
CALLED PATIENT NO ANSWER LEFT VOICEMAIL FOR A CALL BACK ? ?

## 2021-06-19 ENCOUNTER — Telehealth: Payer: Self-pay

## 2021-06-19 ENCOUNTER — Other Ambulatory Visit: Payer: Self-pay

## 2021-06-19 DIAGNOSIS — Z1211 Encounter for screening for malignant neoplasm of colon: Secondary | ICD-10-CM

## 2021-06-19 MED ORDER — PEG 3350-KCL-NA BICARB-NACL 420 G PO SOLR: 4000.0000 mL | Freq: Once | ORAL | 0 refills | Status: AC

## 2021-06-19 NOTE — Progress Notes (Signed)
Gastroenterology Pre-Procedure Review  Request Date: 08/13/2021 Requesting Physician: Dr. Tobi Bastos  PATIENT REVIEW QUESTIONS: The patient responded to the following health history questions as indicated:    1. Are you having any GI issues? no 2. Do you have a personal history of Polyps? no 3. Do you have a family history of Colon Cancer or Polyps? yes (father) 4. Diabetes Mellitus? no 5. Joint replacements in the past 12 months?no 6. Major health problems in the past 3 months?no 7. Any artificial heart valves, MVP, or defibrillator?no    MEDICATIONS & ALLERGIES:    Patient reports the following regarding taking any anticoagulation/antiplatelet therapy:   Plavix, Coumadin, Eliquis, Xarelto, Lovenox, Pradaxa, Brilinta, or Effient? no Aspirin? no  Patient confirms/reports the following medications:  Current Outpatient Medications  Medication Sig Dispense Refill   amLODipine (NORVASC) 10 MG tablet TAKE 1 TABLET(10 MG) BY MOUTH DAILY 90 tablet 3   lisinopril (ZESTRIL) 10 MG tablet Take 1 tablet (10 mg total) by mouth daily. 90 tablet 1   No current facility-administered medications for this visit.    Patient confirms/reports the following allergies:  No Known Allergies  No orders of the defined types were placed in this encounter.   AUTHORIZATION INFORMATION Primary Insurance: 1D#: Group #:  Secondary Insurance: 1D#: Group #:  SCHEDULE INFORMATION: Date: 08/13/2021 Time: Location:[facility]

## 2021-06-19 NOTE — Telephone Encounter (Signed)
Scheduled for 08/13/2021

## 2021-06-25 ENCOUNTER — Encounter: Payer: Self-pay | Admitting: Gastroenterology

## 2021-06-26 ENCOUNTER — Ambulatory Visit: Payer: BC Managed Care – PPO | Admitting: Family Medicine

## 2021-06-26 ENCOUNTER — Encounter: Payer: Self-pay | Admitting: Family Medicine

## 2021-06-26 VITALS — BP 136/86 | HR 90 | Resp 16 | Ht 70.0 in | Wt 258.0 lb

## 2021-06-26 DIAGNOSIS — L723 Sebaceous cyst: Secondary | ICD-10-CM

## 2021-06-26 MED ORDER — TRAMADOL HCL 50 MG PO TABS: 50.0000 mg | ORAL_TABLET | Freq: Three times a day (TID) | ORAL | 0 refills | Status: AC | PRN

## 2021-06-26 NOTE — Progress Notes (Signed)
[name]   MRN: [id number]    DOB: [birth year]   Date:06/26/2021       Progress Note  Subjective  Chief Complaint  Boil on Back  HPI  Cyst on upper back: he states noticed a bump on upper back a couple of weeks ago, however over the past two days getting larger and very tender this morning. No drainage, no fever or chills, but the pain is very bothersome now . No previous episodes. He has not tried any otc medications   Patient Active Problem List   Diagnosis Date Noted   Low testosterone in male 06/12/2020   History of snoring 06/12/2020   Prediabetes 05/21/2018   Class 2 severe obesity with serious comorbidity and body mass index (BMI) of 37.0 to 37.9 in adult [facility]) 03/22/2016   Hyperlipidemia 03/17/2016   Hesitancy 12/19/2015   Benign prostatic hyperplasia with urinary obstruction 11/29/2015   Essential hypertension, benign 11/05/2015   LVH (left ventricular hypertrophy) 11/05/2015    Past Surgical History:  Procedure Laterality Date   APPENDECTOMY     HERNIA REPAIR Right 06/05/14    Family History  Problem Relation Age of Onset   Cancer Father 37       colon   Prostate cancer Neg Hx    Kidney cancer Neg Hx     Social History   Tobacco Use   Smoking status: Never   Smokeless tobacco: Never  Substance Use Topics   Alcohol use: No     Current Outpatient Medications:    amLODipine (NORVASC) 10 MG tablet, TAKE 1 TABLET(10 MG) BY MOUTH DAILY, Disp: 90 tablet, Rfl: 3   lisinopril (ZESTRIL) 10 MG tablet, Take 1 tablet (10 mg total) by mouth daily., Disp: 90 tablet, Rfl: 1  No Known Allergies  I personally reviewed active problem list, medication list, allergies, family history, social history, health maintenance with the patient/caregiver today.   ROS  Ten systems reviewed and is negative except as mentioned in HPI  Objective  Vitals:   06/26/21 1328  BP: 136/86  Pulse: 90  Resp: 16  SpO2: 99%  Weight: 258 lb (117 kg)  Height: 5\' 10"   (1.778 m)    Body mass index is 37.02 kg/m.  Physical Exam  Constitutional: Patient appears well-developed and well-nourished. Obese  No distress.  HEENT: head atraumatic, normocephalic, pupils equal and reactive to light, neck supple Cardiovascular: Normal rate, regular rhythm and normal heart sounds.  No murmur heard. No BLE edema. Pulmonary/Chest: Effort normal and breath sounds normal. No respiratory distress. Abdominal: Soft.  There is no tenderness. Skin: tender cyst on upper back, no drainage, mild erythema . Unable to express any content, see attached photos  Psychiatric: Patient has a normal mood and affect. behavior is normal. Judgment and thought content normal.   PHQ2/9: Depression screen [facility] 2/9 06/26/2021 06/13/2021 02/07/2021 08/09/2020 06/12/2020  Decreased Interest 0 0 0 0 1  Down, Depressed, Hopeless 0 0 0 0 0  PHQ - 2 Score 0 0 0 0 1  Altered sleeping 0 0 0 - 1  Tired, decreased energy 0 0 0 - 1  Change in appetite 0 0 0 - 0  Feeling bad or failure about yourself  0 0 0 - 1  Trouble concentrating 0 0 0 - 1  Moving slowly or fidgety/restless 0 0 0 - 1  Suicidal thoughts 0 0 0 - 0  PHQ-9 Score 0 0 0 - 6  Difficult doing work/chores - Not difficult  at all - - -  Some recent data might be hidden    phq 9 is negative   Fall Risk: Fall Risk  06/26/2021 06/13/2021 02/07/2021 08/09/2020 06/12/2020  Falls in the past year? 0 0 0 0 0  Number falls in past yr: 0 0 0 0 0  Injury with Fall? 0 0 0 0 0  Risk for fall due to : No Fall Risks - - - -  Follow up Falls prevention discussed - - Falls evaluation completed Falls evaluation completed      Functional Status Survey: Is the patient deaf or have difficulty hearing?: No Does the patient have difficulty seeing, even when wearing glasses/contacts?: No Does the patient have difficulty concentrating, remembering, or making decisions?: No Does the patient have difficulty walking or climbing stairs?: No Does the patient  have difficulty dressing or bathing?: No Does the patient have difficulty doing errands alone such as visiting a doctor's office or shopping?: No    Assessment & Plan   1. Inflamed sebaceous cyst  - traMADol (ULTRAM) 50 MG tablet; Take 1 tablet (50 mg total) by mouth every 8 (eight) hours as needed for up to 5 days.  Dispense: 15 tablet; Refill: 0 - Ambulatory referral to General Surgery

## 2021-06-27 ENCOUNTER — Ambulatory Visit: Payer: BC Managed Care – PPO | Admitting: Surgery

## 2021-06-27 ENCOUNTER — Encounter: Payer: Self-pay | Admitting: Surgery

## 2021-06-27 ENCOUNTER — Other Ambulatory Visit: Payer: Self-pay

## 2021-06-27 VITALS — BP 137/88 | HR 79 | Temp 97.9°F | Ht 70.0 in | Wt 264.6 lb

## 2021-06-27 DIAGNOSIS — L723 Sebaceous cyst: Secondary | ICD-10-CM

## 2021-06-27 NOTE — Progress Notes (Signed)
Patient ID: Jeffrey Rivers, male   DOB: [birth year], 51 y.o.   MRN: [id number]  Chief Complaint: Tender upper back cyst  History of Present Illness Jeffrey Rivers is a 51 y.o. male with 1-1/2-month history of a dermal cyst in upper mid back.  Recently had some swelling with associated tenderness.  Denies any discharge.  Has not been treated with any antibiotics.  No other prior cyst or skin lesion excisions; denies fevers chills..  Past Medical History Past Medical History:  Diagnosis Date   Essential hypertension, benign 11/05/2015   Hypertension       Past Surgical History:  Procedure Laterality Date   APPENDECTOMY     HERNIA REPAIR Right 06/05/2014   Inguinal    No Known Allergies  Current Outpatient Medications  Medication Sig Dispense Refill   amLODipine (NORVASC) 10 MG tablet TAKE 1 TABLET(10 MG) BY MOUTH DAILY 90 tablet 3   lisinopril (ZESTRIL) 10 MG tablet Take 1 tablet (10 mg total) by mouth daily. 90 tablet 1   traMADol (ULTRAM) 50 MG tablet Take 1 tablet (50 mg total) by mouth every 8 (eight) hours as needed for up to 5 days. (Patient not taking: Reported on 06/27/2021) 15 tablet 0   No current facility-administered medications for this visit.    Family History Family History  Problem Relation Age of Onset   Cancer Father 79       colon   Prostate cancer Neg Hx    Kidney cancer Neg Hx       Social History Social History   Tobacco Use   Smoking status: Never   Smokeless tobacco: Never  Vaping Use   Vaping Use: Never used  Substance Use Topics   Alcohol use: No   Drug use: No        Review of Systems  Constitutional: Negative.   HENT: Negative.    Eyes: Negative.   Respiratory: Negative.    Cardiovascular: Negative.   Gastrointestinal: Negative.   Genitourinary: Negative.   Skin: Negative.   Neurological: Negative.   Psychiatric/Behavioral: Negative.       Physical Exam Blood pressure 137/88, pulse 79, temperature 97.9 F (36.6 C), height  5\' 10"  (1.778 m), weight 264 lb 9.6 oz (120 kg), SpO2 98 %. Last Weight  Most recent update: 06/27/2021  1:39 PM    Weight  120 kg (264 lb 9.6 oz)             CONSTITUTIONAL: Well developed, and nourished, appropriately responsive and aware without distress.   EYES: Sclera non-icteric.   EARS, NOSE, MOUTH AND THROAT: Mask worn.   The oropharynx is clear. Oral mucosa is pink and moist.    Hearing is intact to voice.  NECK: Trachea is midline, and there is no jugular venous distension.  LYMPH NODES:  Lymph nodes in the neck are not enlarged. RESPIRATORY:  Lungs are clear, and breath sounds are equal bilaterally. Normal respiratory effort without pathologic use of accessory muscles. CARDIOVASCULAR: Heart is regular in rate and rhythm. GI: The abdomen is soft, nontender, and nondistended. There were no palpable masses. I did not appreciate hepatosplenomegaly. There were normal bowel sounds. MUSCULOSKELETAL:  Symmetrical muscle tone appreciated in all four extremities.    SKIN: Skin turgor is normal. No pathologic skin lesions appreciated.  Mid upper thoracic back just left of midline there is a 2.2 cm thickening, with a central punctum that is tender to touch.  There is minimal erythema present. NEUROLOGIC:  Motor and  sensation appear grossly normal.  Cranial nerves are grossly without defect. PSYCH:  Alert and oriented to person, place and time. Affect is appropriate for situation.  Data Reviewed I have personally reviewed what is currently available of the patient's imaging, recent labs and medical records.   Labs:  CBC Latest Ref Rng & Units 08/09/2020 06/12/2020 02/25/2019  WBC 3.8 - 10.8 Thousand/uL 3.7(L) 5.0 3.3(L)  Hemoglobin 13.2 - 17.1 g/dL 14.1 14.9 13.7  Hematocrit 38.5 - 50.0 % 41.5 43.0 40.9  Platelets 140 - 400 Thousand/uL 207 222 203   CMP Latest Ref Rng & Units 02/07/2021 08/09/2020 06/12/2020  Glucose 65 - 99 mg/dL 109(H) 123(H) 86  BUN 7 - 25 mg/dL 15 15 12   Creatinine  0.60 - 1.29 mg/dL 1.00 1.04 1.04  Sodium 135 - 146 mmol/L 141 140 137  Potassium 3.5 - 5.3 mmol/L 3.7 4.0 4.1  Chloride 98 - 110 mmol/L 106 104 101  CO2 20 - 32 mmol/L 26 27 27   Calcium 8.6 - 10.3 mg/dL 9.2 9.6 9.6  Total Protein 6.1 - 8.1 g/dL 6.6 6.8 7.1  Total Bilirubin 0.2 - 1.2 mg/dL 0.4 0.3 0.4  Alkaline Phos 40 - 115 U/L - - -  AST 10 - 40 U/L 24 25 27   ALT 9 - 46 U/L 28 41 38      Imaging:  Within last 24 hrs: No results found.  Assessment    Inflamed sebaceous cyst of upper mid back 2.2 cm. Patient Active Problem List   Diagnosis Date Noted   Low testosterone in male 06/12/2020   History of snoring 06/12/2020   Prediabetes 05/21/2018   Class 2 severe obesity with serious comorbidity and body mass index (BMI) of 37.0 to 37.9 in adult [facility]) 03/22/2016   Hyperlipidemia 03/17/2016   Hesitancy 12/19/2015   Benign prostatic hyperplasia with urinary obstruction 11/29/2015   Essential hypertension, benign 11/05/2015   LVH (left ventricular hypertrophy) 11/05/2015    Plan    After informed consent was obtained we brought the patient to the procedure room.  He was placed in a prone position where the upper mid back was prepped and draped in usual sterile manner.  Local infiltration of 1% lidocaine with epinephrine is completed to adequate anesthetic effect.  Elliptical incision was made surrounding the punctum, and the underlying irregularly shaped cyst was sharply excised from the dermis and the subcutaneous tissues.  Hemostasis obtained with pressure.  Incision was then closed with 3-0 nylon vertical mattress sutures.  Dry dressing applied.  Patient was instructed to remove the dressing tomorrow he may shower and replace a dry dressing.  He tolerated procedure well.  Face-to-face time spent with the patient and accompanying care providers(if present) was 30 minutes, with more than 50% of the time spent counseling, educating, and coordinating care of the patient.    These  notes generated with voice recognition software. I apologize for typographical errors.  Ronny Bacon M.D., FACS 06/27/2021, 2:11 PM

## 2021-06-27 NOTE — Patient Instructions (Addendum)
We have removed a Cyst in our office today.  We have placed sutures. You will need to return in 7-10 days to have these removed. You may keep a dry gauze over the area to catch any drainage and to protect your clothing.   You may shower tomorrow. Do not rub at the area. You may use Ibuprofen or Tylenol as needed for any discomfort.   Avoid Strenuous activities that will make you sweat during the next 48 hours to avoid the glue coming off prematurely. Avoid activities that will place pressure to this area of the body for 1-2 weeks to avoid re-injury to incision site.  Please see your follow-up appointment provided. We will see you back in office to make sure this area is healed and to review the final pathology. If you have any questions or concerns prior to this appointment, call our office and speak with a nurse.    Excision of Skin Cysts or Lesions Excision of a skin lesion refers to the removal of a section of skin by making small cuts (incisions) in the skin. This procedure may be done to remove a cancerous (malignant) or noncancerous (benign) growth on the skin. It is typically done to treat or prevent cancer or infection. It may also be done to improve cosmetic appearance. The procedure may be done to remove: Cancerous growths, such as basal cell carcinoma, squamous cell carcinoma, or melanoma. Noncancerous growths, such as a cyst or lipoma. Growths, such as moles or skin tags, which may be removed for cosmetic reasons.  Various excision or surgical techniques may be used depending on your condition, the location of the lesion, and your overall health. Tell a health care provider about: Any allergies you have. All medicines you are taking, including vitamins, herbs, eye drops, creams, and over-the-counter medicines. Any problems you or family members have had with anesthetic medicines. Any blood disorders you have. Any surgeries you have had. Any medical conditions you have. Whether  you are pregnant or may be pregnant. What are the risks? Generally, this is a safe procedure. However, problems may occur, including: Bleeding. Infection. Scarring. Recurrence of the cyst, lipoma, or cancer. Changes in skin sensation or appearance, such as discoloration or swelling. Reaction to the anesthetics. Allergic reaction to surgical materials or ointments. Damage to nerves, blood vessels, muscles, or other structures. Continued pain.  What happens before the procedure? Ask your health care provider about: Changing or stopping your regular medicines. This is especially important if you are taking diabetes medicines or blood thinners. Taking medicines such as aspirin and ibuprofen. These medicines can thin your blood. Do not take these medicines before your procedure if your health care provider instructs you not to. You may be asked to take certain medicines. You may be asked to stop smoking. You may have an exam or testing. Plan to have someone take you home after the procedure. Plan to have someone help you with activities during recovery. What happens during the procedure? To reduce your risk of infection: Your health care team will wash or sanitize their hands. Your skin will be washed with soap. You will be given a medicine to numb the area (local anesthetic). One of the following excision techniques will be performed. At the end of any of these procedures, antibiotic ointment will be applied as needed. Each of the following techniques may vary among health care providers and hospitals. Complete Surgical Excision The area of skin that needs to be removed will be marked with  a pen. Using a small scalpel or scissors, the surgeon will gently cut around and under the lesion until it is completely removed. The lesion will be placed in a fluid and sent to the lab for examination. If necessary, bleeding will be controlled with a device that delivers heat (electrocautery). The  edges of the wound may be stitched (sutured) together, and a bandage (dressing) will be applied. This procedure may be performed to treat a cancerous growth or a noncancerous cyst or lesion. Excision of a Cyst The surgeon will make an incision on the cyst. The entire cyst will be removed through the incision. The incision may be closed with sutures.  What happens after the procedure? Return to your normal activities as told by your health care provider. Talk with your health care provider to discuss any test results, treatment options, and if necessary, the need for more tests. This information is not intended to replace advice given to you by your health care provider. Make sure you discuss any questions you have with your health care provider. Document Released: 08/27/2009 Document Revised: 11/08/2015 Document Reviewed: 07/19/2014 Elsevier Interactive Patient Education  [employer].

## 2021-07-04 ENCOUNTER — Encounter: Payer: Self-pay | Admitting: Surgery

## 2021-07-04 ENCOUNTER — Other Ambulatory Visit: Payer: Self-pay

## 2021-07-04 ENCOUNTER — Ambulatory Visit (INDEPENDENT_AMBULATORY_CARE_PROVIDER_SITE_OTHER): Payer: BC Managed Care – PPO | Admitting: Surgery

## 2021-07-04 VITALS — BP 149/89 | HR 92 | Temp 98.7°F | Ht 70.0 in | Wt 264.8 lb

## 2021-07-04 DIAGNOSIS — Z09 Encounter for follow-up examination after completed treatment for conditions other than malignant neoplasm: Secondary | ICD-10-CM

## 2021-07-04 DIAGNOSIS — L723 Sebaceous cyst: Secondary | ICD-10-CM

## 2021-07-04 NOTE — Progress Notes (Signed)
[facility] POST-OP OFFICE VISIT  07/04/2021  HPI: Jeffrey TORREZ is a 51 y.o. male 7 days s/p excision of sebaceous cyst from mid back.  Denies any drainage, pain or tenderness.  Vital signs: There were no vitals taken for this visit.   Physical Exam: Constitutional: He looks well.  Skin: Incision appears to be well-healed vertical mattress sutures in place, removed without issue.  Assessment/Plan: This is a 51 y.o. male 7 days s/p excision of inflamed sebaceous cysts from upper mid back.  Patient Active Problem List   Diagnosis Date Noted   Low testosterone in male 06/12/2020   History of snoring 06/12/2020   Prediabetes 05/21/2018   Class 2 severe obesity with serious comorbidity and body mass index (BMI) of 37.0 to 37.9 in adult [facility]) 03/22/2016   Hyperlipidemia 03/17/2016   Hesitancy 12/19/2015   Benign prostatic hyperplasia with urinary obstruction 11/29/2015   Essential hypertension, benign 11/05/2015   LVH (left ventricular hypertrophy) 11/05/2015    -May follow-up as needed.  We remain readily available should this gentleman have any future issues or concerns or new lesions arise.   Campbell Lerner M.D., FACS 07/04/2021, 11:05 AM

## 2021-07-04 NOTE — Patient Instructions (Signed)
Please call if you have any questions or concerns.  °

## 2021-08-12 NOTE — Anesthesia Preprocedure Evaluation (Addendum)
Anesthesia Evaluation  Patient identified by MRN, date of birth, ID band Patient awake    Reviewed: Allergy & Precautions, NPO status , Patient's Chart, lab work & pertinent test results  Airway Mallampati: III  TM Distance: >3 FB Neck ROM: full    Dental  (+) Chipped,    Pulmonary neg pulmonary ROS,    Pulmonary exam normal        Cardiovascular hypertension, Pt. on medications Normal cardiovascular exam  LVH    Neuro/Psych negative neurological ROS  negative psych ROS   GI/Hepatic negative GI ROS, Neg liver ROS,   Endo/Other  Prediabetes  Renal/GU negative Renal ROS  negative genitourinary   Musculoskeletal   Abdominal (+) + obese,   Peds  Hematology negative hematology ROS (+)   Anesthesia Other Findings Past Medical History: 11/05/2015: Essential hypertension, benign No date: Hypertension  Past Surgical History: No date: APPENDECTOMY 06/05/2014: HERNIA REPAIR; Right     Comment:  Inguinal     Reproductive/Obstetrics negative OB ROS                            Anesthesia Physical Anesthesia Plan  ASA: 2  Anesthesia Plan: General   Post-op Pain Management: Minimal or no pain anticipated   Induction: Intravenous  PONV Risk Score and Plan: Propofol infusion and TIVA  Airway Management Planned: Natural Airway and Nasal Cannula  Additional Equipment:   Intra-op Plan:   Post-operative Plan:   Informed Consent: I have reviewed the patients History and Physical, chart, labs and discussed the procedure including the risks, benefits and alternatives for the proposed anesthesia with the patient or authorized representative who has indicated his/her understanding and acceptance.     Dental advisory given  Plan Discussed with: Anesthesiologist, CRNA and Surgeon  Anesthesia Plan Comments:        Anesthesia Quick Evaluation

## 2021-08-13 ENCOUNTER — Encounter: Payer: Self-pay | Admitting: Gastroenterology

## 2021-08-13 ENCOUNTER — Encounter
Admission: RE | Disposition: A | Discharge status: Home / Self Care | Payer: Self-pay | Source: Home / Self Care | Attending: Gastroenterology

## 2021-08-13 ENCOUNTER — Ambulatory Visit: Payer: BC Managed Care – PPO | Admitting: Anesthesiology

## 2021-08-13 ENCOUNTER — Ambulatory Visit
Admission: RE | Admit: 2021-08-13 | Discharge: 2021-08-13 | Disposition: A | Discharge status: Home / Self Care | Payer: BC Managed Care – PPO | Attending: Gastroenterology | Admitting: Gastroenterology

## 2021-08-13 DIAGNOSIS — Z800 Family history of malignant neoplasm of digestive organs: Secondary | ICD-10-CM | POA: Insufficient documentation

## 2021-08-13 DIAGNOSIS — K621 Rectal polyp: Secondary | ICD-10-CM | POA: Insufficient documentation

## 2021-08-13 DIAGNOSIS — Z1211 Encounter for screening for malignant neoplasm of colon: Secondary | ICD-10-CM | POA: Diagnosis not present

## 2021-08-13 DIAGNOSIS — Z79899 Other long term (current) drug therapy: Secondary | ICD-10-CM | POA: Insufficient documentation

## 2021-08-13 DIAGNOSIS — K635 Polyp of colon: Secondary | ICD-10-CM

## 2021-08-13 DIAGNOSIS — I10 Essential (primary) hypertension: Secondary | ICD-10-CM | POA: Diagnosis not present

## 2021-08-13 DIAGNOSIS — D128 Benign neoplasm of rectum: Secondary | ICD-10-CM | POA: Diagnosis not present

## 2021-08-13 HISTORY — PX: COLONOSCOPY WITH PROPOFOL: SHX5780

## 2021-08-13 SURGERY — COLONOSCOPY WITH PROPOFOL
Anesthesia: General

## 2021-08-13 MED ORDER — PROPOFOL 500 MG/50ML IV EMUL
INTRAVENOUS | Status: DC | PRN
Start: 2021-08-13 — End: 2021-08-13
  Administered 2021-08-13: 150 ug/kg/min via INTRAVENOUS

## 2021-08-13 MED ORDER — SODIUM CHLORIDE 0.9 % IV SOLN
INTRAVENOUS | Status: DC
  Administered 2021-08-13: 1000 mL via INTRAVENOUS

## 2021-08-13 MED ORDER — LIDOCAINE HCL (CARDIAC) PF 100 MG/5ML IV SOSY
PREFILLED_SYRINGE | INTRAVENOUS | Status: DC | PRN
  Administered 2021-08-13: 50 mg via INTRAVENOUS

## 2021-08-13 MED ORDER — PROPOFOL 10 MG/ML IV BOLUS
INTRAVENOUS | Status: DC | PRN
  Administered 2021-08-13: 100 mg via INTRAVENOUS

## 2021-08-13 NOTE — H&P (Signed)
° ° ° °  Jonathon Bellows, MD [address] [place], [place], [place], [id number], [place], [place], [place], [address] Phone: [phone]  Fax: [phone]  Primary Care Physician:  Teodora Medici, DO   Pre-Procedure History & Physical: HPI:  Jeffrey Rivers is a 51 y.o. male is here for an colonoscopy.   Past Medical History:  Diagnosis Date   Essential hypertension, benign 11/05/2015   Hypertension     Past Surgical History:  Procedure Laterality Date   APPENDECTOMY     HERNIA REPAIR Right 06/05/2014   Inguinal    Prior to Admission medications   Medication Sig Start Date End Date Taking? Authorizing Provider  amLODipine (NORVASC) 10 MG tablet TAKE 1 TABLET(10 MG) BY MOUTH DAILY 12/03/20  Yes Delsa Grana, PA-C  lisinopril (ZESTRIL) 10 MG tablet Take 1 tablet (10 mg total) by mouth daily. 12/03/20  Yes Delsa Grana, PA-C    Allergies as of 06/19/2021   (No Known Allergies)    Family History  Problem Relation Age of Onset   Cancer Father 76       colon   Prostate cancer Neg Hx    Kidney cancer Neg Hx     Social History   Socioeconomic History   Marital status: Married    Spouse name: Not on file   Number of children: Not on file   Years of education: Not on file   Highest education level: Not on file  Occupational History   Not on file  Tobacco Use   Smoking status: Never   Smokeless tobacco: Never  Vaping Use   Vaping Use: Never used  Substance and Sexual Activity   Alcohol use: No   Drug use: No   Sexual activity: Not Currently  Other Topics Concern   Not on file  Social History Narrative   Not on file   Social Determinants of Health   Financial Resource Strain: Not on file  Food Insecurity: Not on file  Transportation Needs: Not on file  Physical Activity: Not on file  Stress: Not on file  Social Connections: Not on file  Intimate Partner Violence: Not on file    Review of Systems: See HPI, otherwise negative  ROS  Physical Exam: BP (!) 147/95    Pulse 60    Temp (!) 96.5 F (35.8 C) (Temporal)    Resp 18    Ht 5\' 10"  (1.778 m)    Wt 113.6 kg    SpO2 100%    BMI 35.94 kg/m  General:   Alert,  pleasant and cooperative in NAD Head:  Normocephalic and atraumatic. Neck:  Supple; no masses or thyromegaly. Lungs:  Clear throughout to auscultation, normal respiratory effort.    Heart:  +S1, +S2, Regular rate and rhythm, No edema. Abdomen:  Soft, nontender and nondistended. Normal bowel sounds, without guarding, and without rebound.   Neurologic:  Alert and  oriented x4;  grossly normal neurologically.  Impression/Plan: Jeffrey Rivers is here for an colonoscopy to be performed for Screening colonoscopy , father had colon cancer < 3 age  Risks, benefits, limitations, and alternatives regarding  colonoscopy have been reviewed with the patient.  Questions have been answered.  All parties agreeable.   Jonathon Bellows, MD  08/13/2021, 8:17 AM

## 2021-08-13 NOTE — Transfer of Care (Signed)
Immediate Anesthesia Transfer of Care Note  Patient: Jeffrey Rivers  Procedure(s) Performed: COLONOSCOPY WITH PROPOFOL  Patient Location: PACU and Endoscopy Unit  Anesthesia Type:General  Level of Consciousness: drowsy  Airway & Oxygen Therapy: Patient Spontanous Breathing  Post-op Assessment: Report given to RN and Post -op Vital signs reviewed and stable  Post vital signs: Reviewed and stable  Last Vitals:  Vitals Value Taken Time  BP 121/75 08/13/21 0840  Temp 35.8 C 08/13/21 0840  Pulse 75 08/13/21 0840  Resp 11 08/13/21 0840  SpO2 98 % 08/13/21 0840    Last Pain:  Vitals:   08/13/21 0840  TempSrc: Temporal  PainSc: Asleep         Complications: No notable events documented.

## 2021-08-13 NOTE — Anesthesia Procedure Notes (Signed)
Procedure Name: MAC Date/Time: 08/13/2021 8:22 AM Performed by: Biagio Borg, CRNA Pre-anesthesia Checklist: Patient identified, Emergency Drugs available, Suction available, Patient being monitored and Timeout performed Patient Re-evaluated:Patient Re-evaluated prior to induction Oxygen Delivery Method: Nasal cannula Induction Type: IV induction Placement Confirmation: positive ETCO2 and CO2 detector

## 2021-08-13 NOTE — Op Note (Signed)
[facility]astro[facility] Patient Name: Jeffrey Rivers Procedure Date: 08/13/2021 8:18 AM MRN: [id number] Account #: [id number] Date of Birth: August 15, 1970 Admit Type: Outpatient Age: 51 Room: [facility] ENDO ROOM 2 Gender: Male Note Status: Finalized Instrument Name: Jasper Riling [id number] Procedure:             Colonoscopy Indications:           Screening in patient at increased risk: Colorectal                         cancer in father before age 33 Providers:             Jonathon Bellows MD, MD Referring MD:          No Local Md, MD (Referring MD) Medicines:             Monitored Anesthesia Care Complications:         No immediate complications. Procedure:             Pre-Anesthesia Assessment:                        - Prior to the procedure, a History and Physical was                         performed, and patient medications, allergies and                         sensitivities were reviewed. The patient's tolerance                         of previous anesthesia was reviewed.                        - The risks and benefits of the procedure and the                         sedation options and risks were discussed with the                         patient. All questions were answered and informed                         consent was obtained.                        - ASA Grade Assessment: II - A patient with mild                         systemic disease.                        After obtaining informed consent, the colonoscope was                         passed under direct vision. Throughout the procedure,                         the patient's blood pressure, pulse, and oxygen  saturations were monitored continuously. The                         Colonoscope was introduced through the anus and                         advanced to the the cecum, identified by the                         appendiceal orifice. The colonoscopy was performed                          with ease. The patient tolerated the procedure well.                         The quality of the bowel preparation was adequate. Findings:      The perianal and digital rectal examinations were normal.      A 5 mm polyp was found in the rectum. The polyp was sessile. The polyp       was removed with a cold snare. Resection and retrieval were complete.      The exam was otherwise without abnormality on direct and retroflexion       views. Impression:            - One 5 mm polyp in the rectum, removed with a cold                         snare. Resected and retrieved.                        - The examination was otherwise normal on direct and                         retroflexion views. Recommendation:        - Discharge patient to home (with escort).                        - Resume previous diet.                        - Continue present medications.                        - Await pathology results.                        - Repeat colonoscopy in 5 years for surveillance. Procedure Code(s):     --- Professional ---                        [phone], Colonoscopy, flexible; with removal of                         tumor(s), polyp(s), or other lesion(s) by snare                         technique Diagnosis Code(s):     --- Professional ---                        Z80.0,  Family history of malignant neoplasm of                         digestive organs                        K62.1, Rectal polyp CPT copyright 2019 [employer] Association. All rights reserved. The codes documented in this report are preliminary and upon coder review may  be revised to meet current compliance requirements. Jonathon Bellows, MD Jonathon Bellows MD, MD 08/13/2021 8:39:19 AM This report has been signed electronically. Number of Addenda: 0 Note Initiated On: 08/13/2021 8:18 AM Scope Withdrawal Time: 0 hours 8 minutes 53 seconds  Total Procedure Duration: 0 hours 14 minutes 18 seconds  Estimated Blood Loss:  Estimated blood loss:  none.      [facility]

## 2021-08-13 NOTE — Anesthesia Postprocedure Evaluation (Signed)
Anesthesia Post Note  Patient: Jeffrey Rivers  Procedure(s) Performed: COLONOSCOPY WITH PROPOFOL  Patient location during evaluation: PACU Anesthesia Type: General Level of consciousness: awake and alert Pain management: pain level controlled Vital Signs Assessment: post-procedure vital signs reviewed and stable Respiratory status: spontaneous breathing, nonlabored ventilation and respiratory function stable Cardiovascular status: blood pressure returned to baseline and stable Postop Assessment: no apparent nausea or vomiting Anesthetic complications: no   No notable events documented.   Last Vitals:  Vitals:   08/13/21 0850 08/13/21 0900  BP: 133/90 (!) 138/96  Pulse: 73 64  Resp: 17 18  Temp:    SpO2: 100% (!) 10%    Last Pain:  Vitals:   08/13/21 0900  TempSrc:   PainSc: 0-No pain                 Foye Deer

## 2021-08-14 ENCOUNTER — Encounter: Payer: Self-pay | Admitting: Gastroenterology

## 2021-08-14 LAB — SURGICAL PATHOLOGY

## 2021-08-28 ENCOUNTER — Other Ambulatory Visit: Payer: Self-pay

## 2021-08-28 DIAGNOSIS — I10 Essential (primary) hypertension: Secondary | ICD-10-CM

## 2021-08-28 MED ORDER — LISINOPRIL 10 MG PO TABS: 10.0000 mg | ORAL_TABLET | Freq: Every day | ORAL | 1 refills | Status: DC

## 2021-12-02 ENCOUNTER — Ambulatory Visit: Payer: Self-pay | Admitting: *Deleted

## 2021-12-02 NOTE — Telephone Encounter (Signed)
Message from Jeffrey Rivers sent at 12/02/2021 11:54 AM EDT  Summary: low energy and knee pain with swelling   Pts energy has been real low and he also has pain in his right knee with light swelling / please advise           Call History   Type Contact Phone/Fax User  12/02/2021 11:53 AM EDT Phone (Incoming) Tristan Schroeder, [place] (Self) [phone] Judie Petit) Wyonia Hough   Reason for Disposition  [1] Swollen joint AND [2] no fever or redness  Answer Assessment - Initial Assessment Questions 1. LOCATION and RADIATION: "Where is the pain located?"      For a couple of months my knee has been hurting me.  It's slightly swollen.  Right knee 2. QUALITY: "What does the pain feel like?"  (e.g., sharp, dull, aching, burning)     Dull ache with burning at times.    It's on right side of knee.   Squatting really hurts my knee.  I have to pull myself up.    3. SEVERITY: "How bad is the pain?" "What does it keep you from doing?"   (Scale 1-10; or mild, moderate, severe)   -  MILD (1-3): doesn't interfere with normal activities    -  MODERATE (4-7): interferes with normal activities (e.g., work or school) or awakens from sleep, limping    -  SEVERE (8-10): excruciating pain, unable to do any normal activities, unable to walk     Mild 4. ONSET: "When did the pain start?" "Does it come and go, or is it there all the time?"     A couple of months ago 5. RECURRENT: "Have you had this pain before?" If Yes, ask: "When, and what happened then?"     No 6. SETTING: "Has there been any recent work, exercise or other activity that involved that part of the body?"      No 7. AGGRAVATING FACTORS: "What makes the knee pain worse?" (e.g., walking, climbing stairs, running)     Squatting  8. ASSOCIATED SYMPTOMS: "Is there any swelling or redness of the knee?"     No   mild swelling 9. OTHER SYMPTOMS: "Do you have any other symptoms?" (e.g., chest pain, difficulty breathing, fever, calf pain)     No 10. PREGNANCY:  "Is there any chance you are pregnant?" "When was your last menstrual period?"       N/A  Protocols used: Knee Pain-A-AH

## 2021-12-02 NOTE — Telephone Encounter (Signed)
  Chief Complaint: Right knee pain intermittently Symptoms: Dull ache with intermittent sharp pain on outer side of right knee. Frequency: Intermittently Pertinent Negatives: Patient denies accidents or injuries Disposition: [] ED /[] Urgent Care (no appt availability in office) / [x] Appointment(In office/virtual)/ []  [facility]/ [] Home Care/ [] Refused Recommended Disposition /[] [facility]/ []  Follow-up with PCP Additional Notes: Appt made with Dr. for 12/16/2021.

## 2021-12-12 ENCOUNTER — Ambulatory Visit (INDEPENDENT_AMBULATORY_CARE_PROVIDER_SITE_OTHER): Payer: BC Managed Care – PPO | Admitting: Internal Medicine

## 2021-12-12 ENCOUNTER — Encounter: Payer: Self-pay | Admitting: Internal Medicine

## 2021-12-12 VITALS — BP 124/86 | HR 88 | Temp 98.3°F | Resp 18 | Wt 260.4 lb

## 2021-12-12 DIAGNOSIS — Z125 Encounter for screening for malignant neoplasm of prostate: Secondary | ICD-10-CM

## 2021-12-12 DIAGNOSIS — M25561 Pain in right knee: Secondary | ICD-10-CM

## 2021-12-12 DIAGNOSIS — I10 Essential (primary) hypertension: Secondary | ICD-10-CM

## 2021-12-12 DIAGNOSIS — E782 Mixed hyperlipidemia: Secondary | ICD-10-CM | POA: Diagnosis not present

## 2021-12-12 DIAGNOSIS — R350 Frequency of micturition: Secondary | ICD-10-CM

## 2021-12-12 DIAGNOSIS — R7303 Prediabetes: Secondary | ICD-10-CM

## 2021-12-12 DIAGNOSIS — R5383 Other fatigue: Secondary | ICD-10-CM

## 2021-12-12 DIAGNOSIS — N401 Enlarged prostate with lower urinary tract symptoms: Secondary | ICD-10-CM

## 2021-12-12 MED ORDER — AMLODIPINE BESYLATE 10 MG PO TABS: ORAL_TABLET | ORAL | 1 refills | Status: DC

## 2021-12-12 NOTE — Patient Instructions (Addendum)
It was great seeing you today!  Plan discussed at today's visit: -Blood work ordered today, results will be uploaded to MyChart.  -Right x-ray ordered today, can use Voltaren or Ibuprofen but be sure to stretch before and after exercise -Continue medications, Amlodipine refilled  -Call insurance about if they will cover a GLP-1 weight loss medication   Follow up in: 3 months   Take care and let us know if you have any questions or concerns prior to your next visit.  Dr. Caralee Ates

## 2021-12-12 NOTE — Progress Notes (Signed)
Established Patient Office Visit  Subjective:  Patient ID: Jeffrey Rivers, male    DOB: 1971/06/07  Age: 51 y.o. MRN: [id number]  CC:  Chief Complaint  Patient presents with   Follow-up   Hypertension   Hyperlipidemia   Diabetes    HPI Jeffrey Rivers presents for follow up on chronic medical conditions.  KNEE PAIN Duration:  3 months  Involved knee: right Mechanism of injury:  was exercising more  Location:lateral Onset: gradual Quality:  burning Frequency: intermittent Radiation: no Aggravating factors: walking, running, stairs, and movement  Alleviating factors: rest, ACE Status: stable Treatments attempted: rest  Relief with NSAIDs?:  No NSAIDs Taken Weakness with weight bearing or walking: no Sensation of giving way: no Locking: no Popping: no Bruising: no Swelling: no Redness: no  Hypertension: -Medications: Lisinopril 10, Amlodipine 10 -Patient is mostly compliant with above medications and reports no side effects generally but will have some palpitations occasionally when he takes his medications together.  -Checking BP at home (average): not checking -Denies any SOB, CP, vision changes, LE edema or symptoms of hypotension  HLD: -Medications: None -Last lipid panel: 8/22: Lipid Panel     Component Value Date/Time   CHOL 199 02/07/2021 1529   CHOL 217 (H) 11/05/2015 1518   TRIG 85 02/07/2021 1529   HDL 54 02/07/2021 1529   HDL 59 11/05/2015 1518   CHOLHDL 3.7 02/07/2021 1529   VLDL 11 02/02/2017 0838   LDLCALC 127 (H) 02/07/2021 1529   LABVLDL 12 11/05/2015 1518    The 10-year ASCVD risk score (Arnett DK, et al., 2019) is: 7.7%   Values used to calculate the score:     Age: 56 years     Sex: Male     Is Non-Hispanic African American: Yes     Diabetic: No     Tobacco smoker: No     Systolic Blood Pressure: 882 mmHg     Is BP treated: Yes     HDL Cholesterol: 54 mg/dL     Total Cholesterol: 199 mg/dL  Pre-Diabetes: -Last A1c: 8/22  5.8% -Not currently on medications, maybe interested in trying a   BPH: -Currently on Flomax 0.4 mg BID, having more nocturia lately and increased urinary urgency  -Denies dysuria, hematuria  Health Maintenance: -Blood work due -Colon cancer screening: colonoscopy 2/23, repeat in 5 years   Past Medical History:  Diagnosis Date   Essential hypertension, benign 11/05/2015   Hypertension     Past Surgical History:  Procedure Laterality Date   APPENDECTOMY     COLONOSCOPY WITH PROPOFOL N/A 08/13/2021   Procedure: COLONOSCOPY WITH PROPOFOL;  Surgeon: Jonathon Bellows, MD;  Location: [facility];  Service: Gastroenterology;  Laterality: N/A;   HERNIA REPAIR Right 06/05/2014   Inguinal    Family History  Problem Relation Age of Onset   Cancer Father 55       colon   Prostate cancer Neg Hx    Kidney cancer Neg Hx     Social History   Socioeconomic History   Marital status: Married    Spouse name: Not on file   Number of children: Not on file   Years of education: Not on file   Highest education level: Not on file  Occupational History   Not on file  Tobacco Use   Smoking status: Never   Smokeless tobacco: Never  Vaping Use   Vaping Use: Never used  Substance and Sexual Activity   Alcohol use: No  Drug use: No   Sexual activity: Not Currently  Other Topics Concern   Not on file  Social History Narrative   Not on file   Social Determinants of Health   Financial Resource Strain: Low Risk  (08/09/2020)   Overall Financial Resource Strain (CARDIA)    Difficulty of Paying Living Expenses: Not hard at all  Food Insecurity: No Food Insecurity (08/09/2020)   Hunger Vital Sign    Worried About Running Out of Food in the Last Year: Never true    Ran Out of Food in the Last Year: Never true  Transportation Needs: No Transportation Needs (08/09/2020)   PRAPARE - Hydrologist (Medical): No    Lack of Transportation (Non-Medical): No  Physical  Activity: Insufficiently Active (08/09/2020)   Exercise Vital Sign    Days of Exercise per Week: 3 days    Minutes of Exercise per Session: 40 min  Stress: No Stress Concern Present (08/09/2020)   Arp    Feeling of Stress : Only a little  Social Connections: Socially Integrated (08/09/2020)   Social Connection and Isolation Panel [NHANES]    Frequency of Communication with Friends and Family: More than three times a week    Frequency of Social Gatherings with Friends and Family: More than three times a week    Attends Religious Services: More than 4 times per year    Active Member of [employer] or Organizations: No    Attends Music therapist: More than 4 times per year    Marital Status: Married  Human resources officer Violence: Not At Risk (08/09/2020)   Humiliation, Afraid, Rape, and Kick questionnaire    Fear of Current or Ex-Partner: No    Emotionally Abused: No    Physically Abused: No    Sexually Abused: No    Outpatient Medications Prior to Visit  Medication Sig Dispense Refill   amLODipine (NORVASC) 10 MG tablet TAKE 1 TABLET(10 MG) BY MOUTH DAILY 90 tablet 3   lisinopril (ZESTRIL) 10 MG tablet Take 1 tablet (10 mg total) by mouth daily. 90 tablet 1   tamsulosin (FLOMAX) 0.4 MG CAPS capsule Take 0.8 mg by mouth daily.     No facility-administered medications prior to visit.    No Known Allergies  ROS Review of Systems  Constitutional:  Positive for fatigue. Negative for chills and fever.  Eyes:  Negative for visual disturbance.  Respiratory:  Negative for cough and shortness of breath.   Cardiovascular:  Negative for chest pain.  Genitourinary:  Positive for difficulty urinating, frequency and urgency. Negative for dysuria and hematuria.  Musculoskeletal:  Positive for arthralgias.  Neurological:  Negative for dizziness and headaches.      Objective:    Physical Exam Constitutional:       Appearance: Normal appearance.  HENT:     Head: Normocephalic and atraumatic.  Eyes:     Conjunctiva/sclera: Conjunctivae normal.  Cardiovascular:     Rate and Rhythm: Normal rate and regular rhythm.  Pulmonary:     Effort: Pulmonary effort is normal.     Breath sounds: Normal breath sounds.  Musculoskeletal:     Right knee: Normal. No swelling, effusion or erythema. No tenderness.     Right lower leg: No edema.     Left lower leg: No edema.     Comments: Pain more at quadriceps insertion point on the lateral knee.   Skin:    General:  Skin is warm and dry.  Neurological:     General: No focal deficit present.     Mental Status: He is alert. Mental status is at baseline.  Psychiatric:        Mood and Affect: Mood normal.        Behavior: Behavior normal.     BP 124/86   Pulse 88   Temp 98.3 F (36.8 C)   Resp 18   Wt 260 lb 6.4 oz (118.1 kg)   SpO2 97%   BMI 37.36 kg/m  Wt Readings from Last 3 Encounters:  12/12/21 260 lb 6.4 oz (118.1 kg)  08/13/21 250 lb 8.1 oz (113.6 kg)  07/04/21 264 lb 12.8 oz (120.1 kg)     Health Maintenance Due  Topic Date Due   COVID-19 Vaccine (4 - Pfizer series) 07/19/2020    There are no preventive care reminders to display for this patient.  Lab Results  Component Value Date   TSH 1.75 06/12/2020   Lab Results  Component Value Date   WBC 3.7 (L) 08/09/2020   HGB 14.1 08/09/2020   HCT 41.5 08/09/2020   MCV 81.5 08/09/2020   PLT 207 08/09/2020   Lab Results  Component Value Date   NA 141 02/07/2021   K 3.7 02/07/2021   CO2 26 02/07/2021   GLUCOSE 109 (H) 02/07/2021   BUN 15 02/07/2021   CREATININE 1.00 02/07/2021   BILITOT 0.4 02/07/2021   ALKPHOS 43 02/02/2017   AST 24 02/07/2021   ALT 28 02/07/2021   PROT 6.6 02/07/2021   ALBUMIN 4.3 02/02/2017   CALCIUM 9.2 02/07/2021   ANIONGAP 6 08/23/2015   EGFR 93 02/07/2021   Lab Results  Component Value Date   CHOL 199 02/07/2021   Lab Results  Component Value Date    HDL 54 02/07/2021   Lab Results  Component Value Date   LDLCALC 127 (H) 02/07/2021   Lab Results  Component Value Date   TRIG 85 02/07/2021   Lab Results  Component Value Date   CHOLHDL 3.7 02/07/2021   Lab Results  Component Value Date   HGBA1C 5.8 (H) 02/07/2021      Assessment & Plan:   1. Essential hypertension, benign: Blood pressure stable. Continue Amlodipine 10 mg, refilled today. Continue Lisinopril 10 mg. Can take Lisinopril in the morning and Amlodipine at night to avoid palpitations. Annual labs due.   - CBC w/Diff/Platelet - COMPLETE METABOLIC PANEL WITH GFR - amLODipine (NORVASC) 10 MG tablet; TAKE 1 TABLET(10 MG) BY MOUTH DAILY  Dispense: 90 tablet; Refill: 1  2. Mixed hyperlipidemia: Stable, recheck lipid panel.   - Lipid Profile  3. Prediabetes: Recheck A1c today.   - HgB A1c  4. Fatigue, unspecified type: Check TSH, Vitamin D and testosterone today with above labs.   - TSH - Vitamin D (25 hydroxy) - Testosterone  5. Acute pain of right knee: Pain more at quadriceps insertion point, recommend stretching before and after work outs, anti-inflammatories and ACE compression. Will obtain x-ray of knee to be sure.   - DG Knee Complete 4 Views Right; Future  6. Benign prostatic hyperplasia with urinary frequency/Prostate cancer screening: BPH symptoms increased, on Flomax 0.8 mg daily. Will recheck PSA. Following with Urology, consider Finasteride if labs normal.   - PSA   Follow-up: Return in about 3 months (around 03/14/2022).    Teodora Medici, DO

## 2021-12-13 LAB — COMPLETE METABOLIC PANEL WITH GFR
AG Ratio: 1.9 (calc) (ref 1.0–2.5)
ALT: 28 U/L (ref 9–46)
AST: 23 U/L (ref 10–40)
Albumin: 4.6 g/dL (ref 3.6–5.1)
Alkaline phosphatase (APISO): 35 U/L — ABNORMAL LOW (ref 36–130)
BUN: 15 mg/dL (ref 7–25)
CO2: 25 mmol/L (ref 20–32)
Calcium: 9.2 mg/dL (ref 8.6–10.3)
Chloride: 104 mmol/L (ref 98–110)
Creat: 1.02 mg/dL (ref 0.60–1.29)
Globulin: 2.4 g/dL (calc) (ref 1.9–3.7)
Glucose, Bld: 92 mg/dL (ref 65–99)
Potassium: 4 mmol/L (ref 3.5–5.3)
Sodium: 138 mmol/L (ref 135–146)
Total Bilirubin: 0.5 mg/dL (ref 0.2–1.2)
Total Protein: 7 g/dL (ref 6.1–8.1)
eGFR: 90 mL/min/{1.73_m2} (ref >=60)

## 2021-12-13 LAB — CBC WITH DIFFERENTIAL/PLATELET
Absolute Monocytes: 249 cells/uL (ref 200–950)
Basophils Absolute: 21 cells/uL (ref 0–200)
Basophils Relative: 0.6 %
Eosinophils Absolute: 39 cells/uL (ref 15–500)
Eosinophils Relative: 1.1 %
HCT: 44.1 % (ref 38.5–50.0)
Hemoglobin: 14.9 g/dL (ref 13.2–17.1)
Lymphs Abs: 1880 cells/uL (ref 850–3900)
MCH: 27.6 pg (ref 27.0–33.0)
MCHC: 33.8 g/dL (ref 32.0–36.0)
MCV: 81.8 fL (ref 80.0–100.0)
MPV: 11.3 fL (ref 7.5–12.5)
Monocytes Relative: 7.1 %
Neutro Abs: 1313 cells/uL — ABNORMAL LOW (ref 1500–7800)
Neutrophils Relative %: 37.5 %
Platelets: 210 10*3/uL (ref 140–400)
RBC: 5.39 10*6/uL (ref 4.20–5.80)
RDW: 12.9 % (ref 11.0–15.0)
Total Lymphocyte: 53.7 %
WBC: 3.5 10*3/uL — ABNORMAL LOW (ref 3.8–10.8)

## 2021-12-13 LAB — HEMOGLOBIN A1C
Hgb A1c MFr Bld: 5.9 % of total Hgb — ABNORMAL HIGH (ref <5.7)
Mean Plasma Glucose: 123 mg/dL
eAG (mmol/L): 6.8 mmol/L

## 2021-12-13 LAB — LIPID PANEL
Cholesterol: 202 mg/dL — ABNORMAL HIGH (ref <200)
HDL: 52 mg/dL (ref >=40)
LDL Cholesterol (Calc): 134 mg/dL (calc) — ABNORMAL HIGH
Non-HDL Cholesterol (Calc): 150 mg/dL (calc) — ABNORMAL HIGH (ref <130)
Total CHOL/HDL Ratio: 3.9 (calc) (ref <5.0)
Triglycerides: 72 mg/dL (ref <150)

## 2021-12-13 LAB — PSA: PSA: 0.39 ng/mL (ref <=4.00)

## 2021-12-13 LAB — TESTOSTERONE: Testosterone: 172 ng/dL — ABNORMAL LOW (ref 250–827)

## 2021-12-13 LAB — VITAMIN D 25 HYDROXY (VIT D DEFICIENCY, FRACTURES): Vit D, 25-Hydroxy: 34 ng/mL (ref 30–100)

## 2021-12-13 LAB — TSH: TSH: 1.45 mIU/L (ref 0.40–4.50)

## 2021-12-16 ENCOUNTER — Ambulatory Visit: Payer: BC Managed Care – PPO | Admitting: Internal Medicine

## 2022-02-24 ENCOUNTER — Other Ambulatory Visit: Payer: Self-pay | Admitting: Internal Medicine

## 2022-02-24 DIAGNOSIS — I10 Essential (primary) hypertension: Secondary | ICD-10-CM

## 2022-02-25 NOTE — Telephone Encounter (Signed)
Requested Prescriptions  Pending Prescriptions Disp Refills  . lisinopril (ZESTRIL) 10 MG tablet [Pharmacy Med Name: LISINOPRIL 10MG  TABLETS] 90 tablet 1    Sig: TAKE 1 TABLET(10 MG) BY MOUTH DAILY     Cardiovascular:  ACE Inhibitors Passed - 02/24/2022  6:20 AM      Passed - Cr in normal range and within 180 days    Creat  Date Value Ref Range Status  12/12/2021 1.02 0.60 - 1.29 mg/dL Final         Passed - K in normal range and within 180 days    Potassium  Date Value Ref Range Status  12/12/2021 4.0 3.5 - 5.3 mmol/L Final         Passed - Patient is not pregnant      Passed - Last BP in normal range    BP Readings from Last 1 Encounters:  12/12/21 124/86         Passed - Valid encounter within last 6 months    Recent Outpatient Visits          2 months ago Essential hypertension, benign   [facility], DO   8 months ago Inflamed sebaceous cyst   [facility], MD   8 months ago Essential hypertension, benign   [facility], DO   1 year ago Essential hypertension, benign   [facility], PA-C   1 year ago Annual physical exam   [facility], PA-C      Future Appointments            In 1 week Danelle Berry, MD [facility]

## 2022-03-10 ENCOUNTER — Ambulatory Visit (INDEPENDENT_AMBULATORY_CARE_PROVIDER_SITE_OTHER): Payer: BC Managed Care – PPO | Admitting: Urology

## 2022-03-10 ENCOUNTER — Encounter: Payer: Self-pay | Admitting: Urology

## 2022-03-10 VITALS — BP 120/83 | HR 69 | Ht 70.0 in | Wt 250.0 lb

## 2022-03-10 DIAGNOSIS — R350 Frequency of micturition: Secondary | ICD-10-CM

## 2022-03-10 DIAGNOSIS — R339 Retention of urine, unspecified: Secondary | ICD-10-CM | POA: Diagnosis not present

## 2022-03-10 LAB — URINALYSIS, COMPLETE
Bilirubin, UA: NEGATIVE
Glucose, UA: NEGATIVE
Ketones, UA: NEGATIVE
Leukocytes,UA: NEGATIVE
Nitrite, UA: NEGATIVE
Protein,UA: NEGATIVE
RBC, UA: NEGATIVE
Specific Gravity, UA: 1.03 (ref 1.005–1.030)
Urobilinogen, Ur: 0.2 mg/dL (ref 0.2–1.0)
pH, UA: 5 (ref 5.0–7.5)

## 2022-03-10 LAB — MICROSCOPIC EXAMINATION: Bacteria, UA: NONE SEEN

## 2022-03-10 MED ORDER — TAMSULOSIN HCL 0.4 MG PO CAPS: 0.8000 mg | ORAL_CAPSULE | Freq: Every day | ORAL | 11 refills | Status: DC

## 2022-03-10 NOTE — Progress Notes (Signed)
03/10/2022 3:27 PM   Binnie Kand [place] 02/17/1971 [id number]  Referring provider: Delsa Grana, PA-C [address] [place],  [place] [address]  Chief Complaint  Patient presents with   Follow-up    1 year follow-up    HPI: I reviewed the medical record.  PSA 0.39 December 12, 2021 He still has a very slow flow especially if he does not take the Flomax 0.8 mg.  He will get urgency and then only voided with a slower stream. We went over the role of urodynamics again and the concept of prostate obstruction and I mentioned various procedures.  We talked about various side effects of procedures and when he could work again.  He does not do a lot of heavy lifting straining.  Many questions were answered.   PMH: Past Medical History:  Diagnosis Date   Essential hypertension, benign 11/05/2015   Hypertension     Surgical History: Past Surgical History:  Procedure Laterality Date   APPENDECTOMY     COLONOSCOPY WITH PROPOFOL N/A 08/13/2021   Procedure: COLONOSCOPY WITH PROPOFOL;  Surgeon: Jonathon Bellows, MD;  Location: [facility] END[facility];  Service: Gastroenterology;  Laterality: N/A;   HERNIA REPAIR Right 06/05/2014   Inguinal    Home Medications:  Allergies as of 03/10/2022   No Known Allergies      Medication List        Accurate as of March 10, 2022  3:27 PM. If you have any questions, ask your nurse or doctor.          amLODipine 10 MG tablet Commonly known as: NORVASC TAKE 1 TABLET(10 MG) BY MOUTH DAILY   lisinopril 10 MG tablet Commonly known as: ZESTRIL TAKE 1 TABLET(10 MG) BY MOUTH DAILY   tamsulosin 0.4 MG Caps capsule Commonly known as: FLOMAX Take 0.8 mg by mouth daily.        Allergies: No Known Allergies  Family History: Family History  Problem Relation Age of Onset   Cancer Father 55       colon   Prostate cancer Neg Hx    Kidney cancer Neg Hx     Social History:  reports that he has never smoked. He has never been exposed to  tobacco smoke. He has never used smokeless tobacco. He reports that he does not drink alcohol and does not use drugs.  ROS:                                        Physical Exam: There were no vitals taken for this visit.  Constitutional:  Alert and oriented, No acute distress. HEENT: [place] AT, moist mucus membranes.  Trachea midline, no  Laboratory Data: Lab Results  Component Value Date   WBC 3.5 (L) 12/12/2021   HGB 14.9 12/12/2021   HCT 44.1 12/12/2021   MCV 81.8 12/12/2021   PLT 210 12/12/2021    Lab Results  Component Value Date   CREATININE 1.02 12/12/2021    Lab Results  Component Value Date   PSA 0.39 12/12/2021   PSA 0.39 08/09/2020   PSA 0.4 02/02/2017    Lab Results  Component Value Date   TESTOSTERONE 172 (L) 12/12/2021    Lab Results  Component Value Date   HGBA1C 5.9 (H) 12/12/2021    Urinalysis    Component Value Date/Time   APPEARANCEUR Clear 02/14/2019 0841   GLUCOSEU Negative 02/14/2019 0841  BILIRUBINUR Negative 02/14/2019 0841   PROTEINUR Negative 02/14/2019 0841   NITRITE Negative 02/14/2019 0841   LEUKOCYTESUR Negative 02/14/2019 0841    Pertinent Imaging:   Assessment & Plan: I renewed medication and I will see in a year.  He will call for urodynamics if he would like to proceed with possible minimally invasive therapy or surgery.  He has seen Dr. [name] in the past.  Certainly he can get his care here or [facility].  1. Urinary frequency  - Urinalysis, Complete   No follow-ups on file.  Martina Sinner, MD  [facility] [address] [place], [place] [address] [phone]

## 2022-03-14 ENCOUNTER — Ambulatory Visit: Payer: BC Managed Care – PPO | Admitting: Internal Medicine

## 2022-08-29 ENCOUNTER — Ambulatory Visit: Payer: Self-pay | Admitting: *Deleted

## 2022-08-29 NOTE — Telephone Encounter (Signed)
Summary: BP concern   The patient shares that their BP was 140/96 taken last night 08/27/21 at 9 PM approx  The patient shares that they have experienced slight dizziness and stomach discomfort  Please contact the patient further when possible         Reason for Disposition  [id number] Systolic BP  >= [id number] OR Diastolic >= 80 AND [id number] taking BP medications  Answer Assessment - Initial Assessment Questions 1. BLOOD PRESSURE: "What is the blood pressure?" "Did you take at least two measurements 5 minutes apart?"     140/96- at work medical center 2. ONSET: "When did you take your blood pressure?"     1:30 3. HOW: "How did you take your blood pressure?" (e.g., automatic home BP monitor, visiting nurse)     Manually- arm 4. HISTORY: "Do you have a history of high blood pressure?"     yes 5. MEDICINES: "Are you taking any medicines for blood pressure?" "Have you missed any doses recently?"     Yes, not regular with dosing 6. OTHER SYMPTOMS: "Do you have any symptoms?" (e.g., blurred vision, chest pain, difficulty breathing, headache, weakness)     This morning - fatigued, lightheaded  Protocols used: Blood Pressure - High-A-AH

## 2022-08-29 NOTE — Telephone Encounter (Signed)
  Chief Complaint: elevated BP Symptoms: lightheaded, fatigued this am, 140/96- today at work medical center Frequency: patient states he does not check BP regularly, does not take medications regularly, has recently been tired/fatigued Pertinent Negatives: Patient denies symptoms now Disposition: [] ED /[] Urgent Care (no appt availability in office) / [x] Appointment(In office/virtual)/ []  [place] [facility]/ [] Home Care/ [] Refused Recommended Disposition /[] [facility]/ []  Follow-up with PCP Additional Notes: Appointment has been scheduled- patient advised if symptoms return- he should be seen- may even want to stop by medical center before leaving work to have BP checked again. Patient advised UC/or ED over weekend- has appointment Monday- will bring home wrist cuff to compare reading.

## 2022-08-31 NOTE — Progress Notes (Unsigned)
Established Patient Office Visit  Subjective:  Patient ID: Jeffrey Rivers, male    DOB: 1970-10-23  Age: 52 y.o. MRN: [id number]  CC:  No chief complaint on file.   HPI Jeffrey Rivers presents for BP concern.   Hypertension: -Medications: Lisinopril 10 mg, Amlodipine 10 mg -Patient is mostly compliant with above medications and reports no side effects generally but will have some palpitations occasionally when he takes his medications together.  -Checking BP at home (average): not checking -Denies any SOB, CP, vision changes, LE edema or symptoms of hypotension  HLD: -Medications: None -Last lipid panel: 8/22: Lipid Panel     Component Value Date/Time   CHOL 202 (H) 12/12/2021 1626   CHOL 217 (H) 11/05/2015 1518   TRIG 72 12/12/2021 1626   HDL 52 12/12/2021 1626   HDL 59 11/05/2015 1518   CHOLHDL 3.9 12/12/2021 1626   VLDL 11 02/02/2017 0838   LDLCALC 134 (H) 12/12/2021 1626   LABVLDL 12 11/05/2015 1518    The 10-year ASCVD risk score (Arnett DK, et al., 2019) is: 8.3%   Values used to calculate the score:     Age: 62 years     Sex: Male     Is Non-Hispanic African American: Yes     Diabetic: No     Tobacco smoker: No     Systolic Blood Pressure: 0000000 mmHg     Is BP treated: Yes     HDL Cholesterol: 52 mg/dL     Total Cholesterol: 202 mg/dL  Pre-Diabetes: -Last A1c: 8/22 5.8% -Not currently on medications, maybe interested in trying a   BPH: -Currently on Flomax 0.4 mg BID, having more nocturia lately and increased urinary urgency  -Denies dysuria, hematuria  Health Maintenance: -Blood work due -Colon cancer screening: colonoscopy 2/23, repeat in 5 years   Past Medical History:  Diagnosis Date   Essential hypertension, benign 11/05/2015   Hypertension     Past Surgical History:  Procedure Laterality Date   APPENDECTOMY     COLONOSCOPY WITH PROPOFOL N/A 08/13/2021   Procedure: COLONOSCOPY WITH PROPOFOL;  Surgeon: Jonathon Bellows, MD;  [facility]: [facility]  ENDOSCOPY;  Service: Gastroenterology;  Laterality: N/A;   HERNIA REPAIR Right 06/05/2014   Inguinal    Family History  Problem Relation Age of Onset   Cancer Father 76       colon   Prostate cancer Neg Hx    Kidney cancer Neg Hx     Social History   Socioeconomic History   Marital status: Married    Spouse name: Not on file   Number of children: Not on file   Years of education: Not on file   Highest education level: Not on file  Occupational History   Not on file  Tobacco Use   Smoking status: Never    Passive exposure: Never   Smokeless tobacco: Never  Vaping Use   Vaping Use: Never used  Substance and Sexual Activity   Alcohol use: No   Drug use: No   Sexual activity: Not Currently  Other Topics Concern   Not on file  Social History Narrative   Not on file   Social Determinants of Health   Financial Resource Strain: Low Risk  (08/09/2020)   Overall Financial Resource Strain (CARDIA)    Difficulty of Paying Living Expenses: Not hard at all  Food Insecurity: No Food Insecurity (08/09/2020)   Hunger Vital Sign    Worried About Running Out of Food in the  Last Year: Never true    [place] in the Last Year: Never true  Transportation Needs: No Transportation Needs (08/09/2020)   PRAPARE - Hydrologist (Medical): No    Lack of Transportation (Non-Medical): No  Physical Activity: Insufficiently Active (08/09/2020)   Exercise Vital Sign    Days of Exercise per Week: 3 days    Minutes of Exercise per Session: 40 min  Stress: No Stress Concern Present (08/09/2020)   [place]    Feeling of Stress : Only a little  Social Connections: Socially Integrated (08/09/2020)   Social Connection and Isolation Panel [NHANES]    Frequency of Communication with Friends and Family: More than three times a week    Frequency of Social Gatherings with Friends and Family: More than three  times a week    Attends Religious Services: More than 4 times per year    Active Member of [employer] or Organizations: No    Attends Music therapist: More than 4 times per year    Marital Status: Married  Human resources officer Violence: Not At Risk (08/09/2020)   Humiliation, Afraid, Rape, and Kick questionnaire    Fear of Current or Ex-Partner: No    Emotionally Abused: No    Physically Abused: No    Sexually Abused: No    Outpatient Medications Prior to Visit  Medication Sig Dispense Refill   amLODipine (NORVASC) 10 MG tablet TAKE 1 TABLET(10 MG) BY MOUTH DAILY 90 tablet 1   lisinopril (ZESTRIL) 10 MG tablet TAKE 1 TABLET(10 MG) BY MOUTH DAILY 90 tablet 0   tamsulosin (FLOMAX) 0.4 MG CAPS capsule Take 2 capsules (0.8 mg total) by mouth daily. 60 capsule 11   No facility-administered medications prior to visit.    No Known Allergies  ROS Review of Systems  Constitutional:  Positive for fatigue. Negative for chills and fever.  Eyes:  Negative for visual disturbance.  Respiratory:  Negative for cough and shortness of breath.   Cardiovascular:  Negative for chest pain.  Genitourinary:  Positive for difficulty urinating, frequency and urgency. Negative for dysuria and hematuria.  Musculoskeletal:  Positive for arthralgias.  Neurological:  Negative for dizziness and headaches.      Objective:    Physical Exam Constitutional:      Appearance: Normal appearance.  HENT:     Head: Normocephalic and atraumatic.  Eyes:     Conjunctiva/sclera: Conjunctivae normal.  Cardiovascular:     Rate and Rhythm: Normal rate and regular rhythm.  Pulmonary:     Effort: Pulmonary effort is normal.     Breath sounds: Normal breath sounds.  Musculoskeletal:     Right knee: Normal. No swelling, effusion or erythema. No tenderness.     Right lower leg: No edema.     Left lower leg: No edema.     Comments: Pain more at quadriceps insertion point on the lateral knee.   Skin:    General:  Skin is warm and dry.  Neurological:     General: No focal deficit present.     Mental Status: He is alert. Mental status is at baseline.  Psychiatric:        Mood and Affect: Mood normal.        Behavior: Behavior normal.     There were no vitals taken for this visit. Wt Readings from Last 3 Encounters:  03/10/22 250 lb (113.4 kg)  12/12/21 260 lb  6.4 oz (118.1 kg)  08/13/21 250 lb 8.1 oz (113.6 kg)     Health Maintenance Due  Topic Date Due   DTaP/Tdap/Td (1 - Tdap) Never done   COVID-19 Vaccine (4 - 2023-24 season) 02/14/2022    There are no preventive care reminders to display for this patient.  Lab Results  Component Value Date   TSH 1.45 12/12/2021   Lab Results  Component Value Date   WBC 3.5 (L) 12/12/2021   HGB 14.9 12/12/2021   HCT 44.1 12/12/2021   MCV 81.8 12/12/2021   PLT 210 12/12/2021   Lab Results  Component Value Date   NA 138 12/12/2021   K 4.0 12/12/2021   CO2 25 12/12/2021   GLUCOSE 92 12/12/2021   BUN 15 12/12/2021   CREATININE 1.02 12/12/2021   BILITOT 0.5 12/12/2021   ALKPHOS 43 02/02/2017   AST 23 12/12/2021   ALT 28 12/12/2021   PROT 7.0 12/12/2021   ALBUMIN 4.3 02/02/2017   CALCIUM 9.2 12/12/2021   ANIONGAP 6 08/23/2015   EGFR 90 12/12/2021   Lab Results  Component Value Date   CHOL 202 (H) 12/12/2021   Lab Results  Component Value Date   HDL 52 12/12/2021   Lab Results  Component Value Date   LDLCALC 134 (H) 12/12/2021   Lab Results  Component Value Date   TRIG 72 12/12/2021   Lab Results  Component Value Date   CHOLHDL 3.9 12/12/2021   Lab Results  Component Value Date   HGBA1C 5.9 (H) 12/12/2021      Assessment & Plan:   1. Essential hypertension, benign: Blood pressure stable. Continue Amlodipine 10 mg, refilled today. Continue Lisinopril 10 mg. Can take Lisinopril in the morning and Amlodipine at night to avoid palpitations. Annual labs due.   - CBC w/Diff/Platelet - COMPLETE METABOLIC PANEL WITH  GFR - amLODipine (NORVASC) 10 MG tablet; TAKE 1 TABLET(10 MG) BY MOUTH DAILY  Dispense: 90 tablet; Refill: 1  2. Mixed hyperlipidemia: Stable, recheck lipid panel.   - Lipid Profile  3. Prediabetes: Recheck A1c today.   - HgB A1c  4. Fatigue, unspecified type: Check TSH, Vitamin D and testosterone today with above labs.   - TSH - Vitamin D (25 hydroxy) - Testosterone  5. Acute pain of right knee: Pain more at quadriceps insertion point, recommend stretching before and after work outs, anti-inflammatories and ACE compression. Will obtain x-ray of knee to be sure.   - DG Knee Complete 4 Views Right; Future  6. Benign prostatic hyperplasia with urinary frequency/Prostate cancer screening: BPH symptoms increased, on Flomax 0.8 mg daily. Will recheck PSA. Following with Urology, consider Finasteride if labs normal.   - PSA   Follow-up: No follow-ups on file.    Teodora Medici, DO

## 2022-09-01 ENCOUNTER — Ambulatory Visit: Payer: BC Managed Care – PPO | Admitting: Internal Medicine

## 2022-09-01 ENCOUNTER — Encounter: Payer: Self-pay | Admitting: Internal Medicine

## 2022-09-01 VITALS — BP 122/92 | HR 74 | Temp 97.6°F | Resp 18 | Ht 70.0 in | Wt 267.0 lb

## 2022-09-01 DIAGNOSIS — R7989 Other specified abnormal findings of blood chemistry: Secondary | ICD-10-CM | POA: Diagnosis not present

## 2022-09-01 DIAGNOSIS — I10 Essential (primary) hypertension: Secondary | ICD-10-CM

## 2022-09-01 DIAGNOSIS — Z23 Encounter for immunization: Secondary | ICD-10-CM | POA: Diagnosis not present

## 2022-09-01 DIAGNOSIS — R7303 Prediabetes: Secondary | ICD-10-CM | POA: Diagnosis not present

## 2022-09-01 DIAGNOSIS — E782 Mixed hyperlipidemia: Secondary | ICD-10-CM

## 2022-09-01 LAB — POCT GLYCOSYLATED HEMOGLOBIN (HGB A1C): Hemoglobin A1C: 6 % — AB (ref 4.0–5.6)

## 2022-09-01 MED ORDER — LISINOPRIL 10 MG PO TABS: 10.0000 mg | ORAL_TABLET | Freq: Every day | ORAL | 1 refills | Status: DC

## 2022-09-01 MED ORDER — AMLODIPINE BESYLATE 10 MG PO TABS: ORAL_TABLET | ORAL | 1 refills | Status: DC

## 2022-09-01 NOTE — Patient Instructions (Addendum)
It was great seeing you today!  Plan discussed at today's visit: -Medications refilled  -Plan for fasting labs for next time  -Tdap vaccine today -A1c 6.0% today which is in the pre-diabetic range  Follow up in: 6 months   Take care and let us know if you have any questions or concerns prior to your next visit.  Dr. Rosana Berger

## 2022-09-03 ENCOUNTER — Other Ambulatory Visit: Payer: Self-pay | Admitting: Internal Medicine

## 2022-09-03 DIAGNOSIS — I10 Essential (primary) hypertension: Secondary | ICD-10-CM

## 2022-09-03 NOTE — Telephone Encounter (Signed)
Requested Prescriptions  Pending Prescriptions Disp Refills   lisinopril (ZESTRIL) 10 MG tablet [Pharmacy Med Name: LISINOPRIL 10MG  TABLETS] 90 tablet 1    Sig: TAKE 1 TABLET(10 MG) BY MOUTH DAILY     Cardiovascular:  ACE Inhibitors Failed - 09/03/2022  6:19 AM      Failed - Cr in normal range and within 180 days    Creat  Date Value Ref Range Status  12/12/2021 1.02 0.60 - 1.29 mg/dL Final         Failed - K in normal range and within 180 days    Potassium  Date Value Ref Range Status  12/12/2021 4.0 3.5 - 5.3 mmol/L Final         Failed - Last BP in normal range    BP Readings from Last 1 Encounters:  09/01/22 (!) 122/92         Passed - Patient is not pregnant      Passed - Valid encounter within last 6 months    Recent Outpatient Visits           2 days ago Prediabetes   [facility] Teodora Medici, DO   8 months ago Essential hypertension, benign   [facility] Teodora Medici, DO   1 year ago Inflamed sebaceous cyst   [facility] Steele Sizer, MD   1 year ago Essential hypertension, benign   [facility] Teodora Medici, DO   1 year ago Essential hypertension, benign   [facility] Delsa Grana, PA-C       Future Appointments             In 6 months Teodora Medici, [facility], [place]   In 6 months MacDiarmid, Nicki Reaper, [place]

## 2022-09-04 NOTE — Progress Notes (Signed)
09/05/2022 10:32 AM   Jeffrey Rivers 1970-09-26 [id number]  Referring provider: Teodora Medici, [address] [place],  [place] [address]  Urological history: 1. BPH with LU TS -PSA (11/2021) 0.39 -cysto (2017) - prominent obstructing median lobe -Failed alfuzosin secondary to heart palpitations and drowsiness -Tamsulosin 0.4 mg 2 capsules daily  Chief Complaint  Patient presents with   Follow-up   Urinary Frequency    HPI: Jeffrey Rivers is a 52 y.o. male who presents today for frequent urination at night, on meds.    I PSS 19/3  PVR 29 mL   UA yellow clear, specific gravity 1.025, pH 5.5, 0-5 WBCs, 0-2 RBCs, 0-10 epithelial cells and few bacteria.  He has been seen by both Dr. Junious Silk and Dr. Matilde Sprang in our office for the similar complaints.  Dr. Junious Silk performed cystoscopy in 2017 and noted a prominent obstructing median lobe.  Dr. Matilde Sprang has recommended urodynamic studies which he has not yet completed.  He is having issues with both storage symptoms (frequency, urgency, feelings of incomplete bladder emptying and nocturia) and obstructive symptoms (weak urinary stream, straining to urinate, intermittency and hesitancy).  He does not feel that he is putting out large volumes of urine with his daytime or night time voids.  He is not taking his tamsulosin on a consistent basis.  Patient denies any modifying or aggravating factors.  Patient denies any gross hematuria, dysuria or suprapubic/flank pain.  Patient denies any fevers, chills, nausea or vomiting.     IPSS     Row Name 09/05/22 0900         International Prostate Symptom Score   How often have you had the sensation of not emptying your bladder? About half the time     How often have you had to urinate less than every two hours? About half the time     How often have you found you stopped and started again several times when you urinated? Less than half the time     How  often have you found it difficult to postpone urination? Less than half the time     How often have you had a weak urinary stream? About half the time     How often have you had to strain to start urination? About half the time     How many times did you typically get up at night to urinate? 3 Times     Total IPSS Score 19       Quality of Life due to urinary symptoms   If you were to spend the rest of your life with your urinary condition just the way it is now how would you feel about that? Mixed              Score:  1-7 Mild 8-19 Moderate 20-35 Severe     PMH: Past Medical History:  Diagnosis Date   Essential hypertension, benign 11/05/2015   Hypertension     Surgical History: Past Surgical History:  Procedure Laterality Date   APPENDECTOMY     COLONOSCOPY WITH PROPOFOL N/A 08/13/2021   Procedure: COLONOSCOPY WITH PROPOFOL;  Surgeon: Jonathon Bellows, MD;  Location: [facility] ENDOSCOPY;  Service: Gastroenterology;  Laterality: N/A;   HERNIA REPAIR Right 06/05/2014   Inguinal    Home Medications:  Allergies as of 09/05/2022   No Known Allergies      Medication List        Accurate as of September 05, 2022 10:32 AM. If you have any questions, ask your nurse or doctor.          amLODipine 10 MG tablet Commonly known as: NORVASC TAKE 1 TABLET(10 MG) BY MOUTH DAILY   lisinopril 10 MG tablet Commonly known as: ZESTRIL Take 1 tablet (10 mg total) by mouth daily.   tamsulosin 0.4 MG Caps capsule Commonly known as: FLOMAX Take 2 capsules (0.8 mg total) by mouth daily.        Allergies: No Known Allergies  Family History: Family History  Problem Relation Age of Onset   Cancer Father 59       colon   Prostate cancer Neg Hx    Kidney cancer Neg Hx     Social History:  reports that he has never smoked. He has never been exposed to tobacco smoke. He has never used smokeless tobacco. He reports that he does not drink alcohol and does not use  drugs.  ROS: Pertinent ROS in HPI  Physical Exam: BP 132/85   Pulse 96   Ht 5\' 10"  (1.778 m)   Wt 250 lb (113.4 kg)   BMI 35.87 kg/m   Constitutional:  Well nourished. Alert and oriented, No acute distress. HEENT:  AT, moist mucus membranes.  Trachea midline Cardiovascular: No clubbing, cyanosis, or edema. Respiratory: Normal respiratory effort, no increased work of breathing. Neurologic: Grossly intact, no focal deficits, moving all 4 extremities. Psychiatric: Normal mood and affect.  Laboratory Data: Lab Results  Component Value Date   WBC 3.5 (L) 12/12/2021   HGB 14.9 12/12/2021   HCT 44.1 12/12/2021   MCV 81.8 12/12/2021   PLT 210 12/12/2021    Lab Results  Component Value Date   CREATININE 1.02 12/12/2021    Lab Results  Component Value Date   PSA 0.39 12/12/2021   PSA 0.39 08/09/2020   PSA 0.4 02/02/2017    Lab Results  Component Value Date   TESTOSTERONE 172 (L) 12/12/2021    Lab Results  Component Value Date   HGBA1C 6.0 (A) 09/01/2022    Lab Results  Component Value Date   TSH 1.45 12/12/2021       Component Value Date/Time   CHOL 202 (H) 12/12/2021 1626   CHOL 217 (H) 11/05/2015 1518   HDL 52 12/12/2021 1626   HDL 59 11/05/2015 1518   CHOLHDL 3.9 12/12/2021 1626   VLDL 11 02/02/2017 0838   LDLCALC 134 (H) 12/12/2021 1626    Lab Results  Component Value Date   AST 23 12/12/2021   Lab Results  Component Value Date   ALT 28 12/12/2021    Urinalysis    Component Value Date/Time   APPEARANCEUR Clear 03/10/2022 1529   GLUCOSEU Negative 03/10/2022 1529   BILIRUBINUR Negative 03/10/2022 1529   PROTEINUR Negative 03/10/2022 1529   NITRITE Negative 03/10/2022 1529   LEUKOCYTESUR Negative 03/10/2022 1529  I have reviewed the labs.   Pertinent Imaging:  09/05/2022  Scan Result 29 ml      Assessment & Plan:    1. Nocturia - I explained to the patient that nocturia is often multi-factorial and difficult to treat.  Sleeping  disorders, heart conditions, peripheral vascular disease, diabetes, an enlarged prostate for men, an urethral stricture causing bladder outlet obstruction and/or certain medications can contribute to nocturia.  2. BPH with LUTS -PSA stable  -PVR < 300 cc  -most bothersome symptoms are hesitancy, urgency and nocturia -continue conservative management, avoiding bladder irritants and timed voiding's -Discussed with patient  that his presentation is consistent with both storage and obstructive symptoms and at this time, we can choose for the next 30 days to take the tamsulosin consistently twice daily or start an OAB medication with the understanding that this may possibly put him into urinary retention or pursue the urodynamics so that we can get a clear picture -He would like to spend the next 30 days taking the tamsulosin 0.4 mg a.m. and tamsulosin 0.4 mg p.m. to see if he can get better control of his symptoms  Return in about 1 month (around 10/06/2022) for I PSS .  These notes generated with voice recognition software. I apologize for typographical errors.  [place], [place] [address]  [place], [place] [address] [phone]

## 2022-09-05 ENCOUNTER — Encounter: Payer: Self-pay | Admitting: Urology

## 2022-09-05 ENCOUNTER — Ambulatory Visit: Payer: BC Managed Care – PPO | Admitting: Urology

## 2022-09-05 VITALS — BP 132/85 | HR 96 | Ht 70.0 in | Wt 250.0 lb

## 2022-09-05 DIAGNOSIS — R351 Nocturia: Secondary | ICD-10-CM

## 2022-09-05 DIAGNOSIS — N401 Enlarged prostate with lower urinary tract symptoms: Secondary | ICD-10-CM | POA: Diagnosis not present

## 2022-09-05 DIAGNOSIS — N138 Other obstructive and reflux uropathy: Secondary | ICD-10-CM

## 2022-09-05 LAB — URINALYSIS, COMPLETE
Bilirubin, UA: NEGATIVE
Glucose, UA: NEGATIVE
Ketones, UA: NEGATIVE
Leukocytes,UA: NEGATIVE
Nitrite, UA: NEGATIVE
Protein,UA: NEGATIVE
RBC, UA: NEGATIVE
Specific Gravity, UA: 1.025 (ref 1.005–1.030)
Urobilinogen, Ur: 0.2 mg/dL (ref 0.2–1.0)
pH, UA: 5.5 (ref 5.0–7.5)

## 2022-09-05 LAB — MICROSCOPIC EXAMINATION

## 2022-09-05 LAB — BLADDER SCAN AMB NON-IMAGING: Scan Result: 29

## 2022-09-05 MED ORDER — TAMSULOSIN HCL 0.4 MG PO CAPS: 0.8000 mg | ORAL_CAPSULE | Freq: Every day | ORAL | 3 refills | Status: DC

## 2022-09-09 ENCOUNTER — Ambulatory Visit: Payer: BC Managed Care – PPO | Admitting: Urology

## 2022-10-08 NOTE — Progress Notes (Deleted)
10/09/2022 9:52 PM   Jeffrey Rivers 1971/03/02 [id number]  Referring provider: Margarita Mail, DO [address] [place],  [place] [address]  Urological history: 1. BPH with LU TS -PSA (11/2021) 0.39 -cysto (2017) - prominent obstructing median lobe -Failed alfuzosin secondary to heart palpitations and drowsiness -Tamsulosin 0.4 mg 2 capsules daily  No chief complaint on file.   HPI: Jeffrey Rivers is a 52 y.o. male who presents today for frequent urination at night, on meds.    At his visit on 09/04/2021, I PSS 19/3.  PVR 29 mL.  UA yellow clear, specific gravity 1.025, pH 5.5, 0-5 WBCs, 0-2 RBCs, 0-10 epithelial cells and few bacteria.  He has been seen by both Dr. [name] and Dr. Sherron Monday in our office for the similar complaints.  Dr. [name] performed cystoscopy in 2017 and noted a prominent obstructing median lobe.  Dr. Sherron Monday has recommended urodynamic studies which he has not yet completed.  He is having issues with both storage symptoms (frequency, urgency, feelings of incomplete bladder emptying and nocturia) and obstructive symptoms (weak urinary stream, straining to urinate, intermittency and hesitancy).  He does not feel that he is putting out large volumes of urine with his daytime or night time voids.  He is not taking his tamsulosin on a consistent basis.  Patient denies any modifying or aggravating factors.  Patient denies any gross hematuria, dysuria or suprapubic/flank pain.  Patient denies any fevers, chills, nausea or vomiting.  He decided to take his tamsulosin 0.4 mg BID.    I PSS ***  PVR ***     Score:  1-7 Mild 8-19 Moderate 20-35 Severe     PMH: Past Medical History:  Diagnosis Date   Essential hypertension, benign 11/05/2015   Hypertension     Surgical History: Past Surgical History:  Procedure Laterality Date   APPENDECTOMY     COLONOSCOPY WITH PROPOFOL N/A 08/13/2021   Procedure: COLONOSCOPY WITH PROPOFOL;   Surgeon: Wyline Mood, MD;  Location: [facility] ENDOSCOPY;  Service: Gastroenterology;  Laterality: N/A;   HERNIA REPAIR Right 06/05/2014   Inguinal    Home Medications:  Allergies as of 10/09/2022   No Known Allergies      Medication List        Accurate as of October 08, 2022  9:52 PM. If you have any questions, ask your nurse or doctor.          amLODipine 10 MG tablet Commonly known as: NORVASC TAKE 1 TABLET(10 MG) BY MOUTH DAILY   lisinopril 10 MG tablet Commonly known as: ZESTRIL Take 1 tablet (10 mg total) by mouth daily.   tamsulosin 0.4 MG Caps capsule Commonly known as: FLOMAX Take 2 capsules (0.8 mg total) by mouth daily.        Allergies: No Known Allergies  Family History: Family History  Problem Relation Age of Onset   Cancer Father 68       colon   Prostate cancer Neg Hx    Kidney cancer Neg Hx     Social History:  reports that he has never smoked. He has never been exposed to tobacco smoke. He has never used smokeless tobacco. He reports that he does not drink alcohol and does not use drugs.  ROS: Pertinent ROS in HPI  Physical Exam: There were no vitals taken for this visit.  Constitutional:  Well nourished. Alert and oriented, No acute distress. HEENT: [place] AT, moist mucus membranes.  Trachea midline Cardiovascular: No clubbing,  cyanosis, or edema. Respiratory: Normal respiratory effort, no increased work of breathing. Neurologic: Grossly intact, no focal deficits, moving all 4 extremities. Psychiatric: Normal mood and affect.   Laboratory Data: N/A   Pertinent Imaging: N/A  Assessment & Plan:    1. Nocturia - I explained to the patient that nocturia is often multi-factorial and difficult to treat.  Sleeping disorders, heart conditions, peripheral vascular disease, diabetes, an enlarged prostate for men, an urethral stricture causing bladder outlet obstruction and/or certain medications can contribute to nocturia.  2. BPH with  LUTS -PSA stable  -PVR < 300 cc  -most bothersome symptoms are hesitancy, urgency and nocturia -continue conservative management, avoiding bladder irritants and timed voiding's -Discussed with patient that his presentation is consistent with both storage and obstructive symptoms and at this time, we can choose for the next 30 days to take the tamsulosin consistently twice daily or start an OAB medication with the understanding that this may possibly put him into urinary retention or pursue the urodynamics so that we can get a clear picture -He would like to spend the next 30 days taking the tamsulosin 0.4 mg a.m. and tamsulosin 0.4 mg p.m. to see if he can get better control of his symptoms  No follow-ups on file.  These notes generated with voice recognition software. I apologize for typographical errors.  Cloretta Ned  [facility] [address]  [address] [place], [place] [address] [phone]

## 2022-10-09 ENCOUNTER — Ambulatory Visit: Payer: BC Managed Care – PPO | Admitting: Urology

## 2022-10-09 DIAGNOSIS — R351 Nocturia: Secondary | ICD-10-CM

## 2022-10-09 DIAGNOSIS — N138 Other obstructive and reflux uropathy: Secondary | ICD-10-CM

## 2022-10-29 NOTE — Progress Notes (Deleted)
10/30/2022 4:58 PM   Bud Face Leger [birth year] [id number]  Referring provider: Margarita Mail, DO [address] [place],  [place] [address]  Urological history: 1. BPH with LU TS -PSA (11/2021) 0.39 -cysto (2017) - prominent obstructing median lobe -Failed alfuzosin secondary to heart palpitations and drowsiness -Tamsulosin 0.4 mg 2 capsules daily  No chief complaint on file.   HPI: Jeffrey Rivers is a 52 y.o. male who presents today for frequent urination at night, on meds.    At his visit on 09/04/2021, I PSS 19/3.  PVR 29 mL.  UA yellow clear, specific gravity 1.025, pH 5.5, 0-5 WBCs, 0-2 RBCs, 0-10 epithelial cells and few bacteria.  He has been seen by both Dr. [name] and Dr. Sherron Monday in our office for the similar complaints.  Dr. [name] performed cystoscopy in 2017 and noted a prominent obstructing median lobe.  Dr. Sherron Monday has recommended urodynamic studies which he has not yet completed.  He is having issues with both storage symptoms (frequency, urgency, feelings of incomplete bladder emptying and nocturia) and obstructive symptoms (weak urinary stream, straining to urinate, intermittency and hesitancy).  He does not feel that he is putting out large volumes of urine with his daytime or night time voids.  He is not taking his tamsulosin on a consistent basis.  Patient denies any modifying or aggravating factors.  Patient denies any gross hematuria, dysuria or suprapubic/flank pain.  Patient denies any fevers, chills, nausea or vomiting.  He decided to take his tamsulosin 0.4 mg BID.    I PSS ***  PVR ***     Score:  1-7 Mild 8-19 Moderate 20-35 Severe     PMH: Past Medical History:  Diagnosis Date   Essential hypertension, benign 11/05/2015   Hypertension     Surgical History: Past Surgical History:  Procedure Laterality Date   APPENDECTOMY     COLONOSCOPY WITH PROPOFOL N/A 08/13/2021   Procedure: COLONOSCOPY WITH PROPOFOL;   Surgeon: Wyline Mood, MD;  [facility] END[facility];  Service: Gastroenterology;  Laterality: N/A;   HERNIA REPAIR Right 06/05/2014   Inguinal    Home Medications:  Allergies as of 10/30/2022   No Known Allergies      Medication List        Accurate as of Oct 29, 2022  4:58 PM. If you have any questions, ask your nurse or doctor.          amLODipine 10 MG tablet Commonly known as: NORVASC TAKE 1 TABLET(10 MG) BY MOUTH DAILY   lisinopril 10 MG tablet Commonly known as: ZESTRIL Take 1 tablet (10 mg total) by mouth daily.   tamsulosin 0.4 MG Caps capsule Commonly known as: FLOMAX Take 2 capsules (0.8 mg total) by mouth daily.        Allergies: No Known Allergies  Family History: Family History  Problem Relation Age of Onset   Cancer Father 76       colon   Prostate cancer Neg Hx    Kidney cancer Neg Hx     Social History:  reports that he has never smoked. He has never been exposed to tobacco smoke. He has never used smokeless tobacco. He reports that he does not drink alcohol and does not use drugs.  ROS: Pertinent ROS in HPI  Physical Exam: There were no vitals taken for this visit.  Constitutional:  Well nourished. Alert and oriented, No acute distress. HEENT: [place] AT, moist mucus membranes.  Trachea midline Cardiovascular: No clubbing,  cyanosis, or edema. Respiratory: Normal respiratory effort, no increased work of breathing. Neurologic: Grossly intact, no focal deficits, moving all 4 extremities. Psychiatric: Normal mood and affect.   Laboratory Data: N/A   Pertinent Imaging: N/A  Assessment & Plan:    1. Nocturia - I explained to the patient that nocturia is often multi-factorial and difficult to treat.  Sleeping disorders, heart conditions, peripheral vascular disease, diabetes, an enlarged prostate for men, an urethral stricture causing bladder outlet obstruction and/or certain medications can contribute to nocturia.  2. BPH with LUTS -PSA  stable  -PVR < 300 cc  -most bothersome symptoms are hesitancy, urgency and nocturia -continue conservative management, avoiding bladder irritants and timed voiding's -Discussed with patient that his presentation is consistent with both storage and obstructive symptoms and at this time, we can choose for the next 30 days to take the tamsulosin consistently twice daily or start an OAB medication with the understanding that this may possibly put him into urinary retention or pursue the urodynamics so that we can get a clear picture -He would like to spend the next 30 days taking the tamsulosin 0.4 mg a.m. and tamsulosin 0.4 mg p.m. to see if he can get better control of his symptoms  No follow-ups on file.  These notes generated with voice recognition software. I apologize for typographical errors.  Cloretta Ned  [facility] [address]  [address] [place], [place] [address] [phone]

## 2022-10-30 ENCOUNTER — Ambulatory Visit: Payer: BC Managed Care – PPO | Admitting: Urology

## 2022-11-05 ENCOUNTER — Encounter: Payer: Self-pay | Admitting: Internal Medicine

## 2022-11-05 ENCOUNTER — Ambulatory Visit: Payer: BC Managed Care – PPO | Admitting: Internal Medicine

## 2022-11-05 VITALS — BP 132/88 | HR 95 | Temp 98.0°F | Resp 18 | Ht 70.0 in | Wt 266.8 lb

## 2022-11-05 DIAGNOSIS — R509 Fever, unspecified: Secondary | ICD-10-CM

## 2022-11-05 DIAGNOSIS — R0981 Nasal congestion: Secondary | ICD-10-CM | POA: Diagnosis not present

## 2022-11-05 DIAGNOSIS — J069 Acute upper respiratory infection, unspecified: Secondary | ICD-10-CM | POA: Diagnosis not present

## 2022-11-05 DIAGNOSIS — R051 Acute cough: Secondary | ICD-10-CM

## 2022-11-05 MED ORDER — BENZONATATE 100 MG PO CAPS
100.0000 mg | ORAL_CAPSULE | Freq: Two times a day (BID) | ORAL | 0 refills | Status: DC | PRN
Start: 2022-11-05 — End: 2023-03-09

## 2022-11-05 MED ORDER — METHYLPREDNISOLONE 4 MG PO TBPK: ORAL_TABLET | ORAL | 0 refills | Status: DC

## 2022-11-05 NOTE — Progress Notes (Signed)
Acute Office Visit  Subjective:     Patient ID: Jeffrey Rivers, male    DOB: Dec 02, 1970, 52 y.o.   MRN: [id number]  Chief Complaint  Patient presents with   URI    Fever and congestion, cough, runny nose. Onset yesterday    URI  Associated symptoms include congestion and coughing. Pertinent negatives include no abdominal pain, chest pain, diarrhea, ear pain, headaches, sinus pain, sore throat, vomiting or wheezing.   Patient is in today for fever, congestion. Symptoms started 4 days. Was sent home from work yesterday.   URI Compliant:  -Fever:  subjective fevers, chills -Cough: yes, productive - brownish mucus  -Shortness of breath: no -Wheezing: no -Chest congestion: yes -Nasal congestion: yes -Runny nose: yes -Post nasal drip: yes -Sore throat: no -Sinus pressure: no -Headache: no -Face pain: no -Ear pain: no  -Ear pressure: no  -Vomiting: no -Sick contacts: no -Context: worse -Relief with OTC cold/cough medications: no  -Treatments attempted: pseudoephedrine    Review of Systems  Constitutional:  Positive for chills and malaise/fatigue. Negative for fever.  HENT:  Positive for congestion. Negative for ear pain, sinus pain and sore throat.   Respiratory:  Positive for cough and sputum production. Negative for shortness of breath and wheezing.   Cardiovascular:  Negative for chest pain.  Gastrointestinal:  Negative for abdominal pain, diarrhea and vomiting.  Neurological:  Negative for headaches.        Objective:    BP 132/88   Pulse 95   Temp 98 F (36.7 C)   Resp 18   Ht 5\' 10"  (1.778 m)   Wt 266 lb 12.8 oz (121 kg)   SpO2 95%   BMI 38.28 kg/m  BP Readings from Last 3 Encounters:  11/05/22 132/88  09/05/22 132/85  09/01/22 (!) 122/92   Wt Readings from Last 3 Encounters:  11/05/22 266 lb 12.8 oz (121 kg)  09/05/22 250 lb (113.4 kg)  09/01/22 267 lb (121.1 kg)      Physical Exam Constitutional:      Appearance: Normal appearance.   HENT:     Head: Normocephalic and atraumatic.     Right Ear: Tympanic membrane, ear canal and external ear normal.     Left Ear: Tympanic membrane, ear canal and external ear normal.     Nose: Congestion present.     Mouth/Throat:     Mouth: Mucous membranes are moist.     Pharynx: Posterior oropharyngeal erythema present.  Eyes:     Conjunctiva/sclera: Conjunctivae normal.  Cardiovascular:     Rate and Rhythm: Normal rate and regular rhythm.  Pulmonary:     Effort: Pulmonary effort is normal.     Breath sounds: Normal breath sounds. No wheezing, rhonchi or rales.  Skin:    General: Skin is warm and dry.  Neurological:     General: No focal deficit present.     Mental Status: He is alert. Mental status is at baseline.  Psychiatric:        Mood and Affect: Mood normal.        Behavior: Behavior normal.     No results found for any visits on 11/05/22.      Assessment & Plan:   1. Viral upper respiratory tract infection/Fever, unspecified fever cause/Acute cough/Nasal congestion: Symptoms consistent with viral illness, COVID test pending. Will treat with medrol dose pack, coricidin and cough suppressant. Work note given. Patient will follow up if symptoms worsen or fail to improve.   -  methylPREDNISolone (MEDROL DOSEPAK) 4 MG TBPK tablet; Day 1: Take 8 mg (2 tablets) before breakfast, 4 mg (1 tablet) after lunch, 4 mg (1 tablet) after supper, and 8 mg (2 tablets) at bedtime. Day 2:Take 4 mg (1 tablet) before breakfast, 4 mg (1 tablet) after lunch, 4 mg (1 tablet) after supper, and 8 mg (2 tablets) at bedtime. Day 3: Take 4 mg (1 tablet) before breakfast, 4 mg (1 tablet) after lunch, 4 mg (1 tablet) after supper, and 4 mg (1 tablet) at bedtime. Day 4: Take 4 mg (1 tablet) before breakfast, 4 mg (1 tablet) after lunch, and 4 mg (1 tablet) at bedtime. Day 5: Take 4 mg (1 tablet) before breakfast and 4 mg (1 tablet) at bedtime. Day 6: Take 4 mg (1 tablet) before breakfast.  Dispense: 1  each; Refill: 0 - benzonatate (TESSALON) 100 MG capsule; Take 1 capsule (100 mg total) by mouth 2 (two) times daily as needed for cough.  Dispense: 20 capsule; Refill: 0 - Novel Coronavirus, NAA ([facility])  Return if symptoms worsen or fail to improve.  Margarita Mail, DO

## 2022-11-05 NOTE — Patient Instructions (Signed)
It was great seeing you today!  Plan discussed at today's visit: -COVID test pending -Treat symptoms with medrol dose pack, cough suppressants and Coricidin for high blood pressure over the counter as a decongestant   Follow up in: as needed  Take care and let us know if you have any questions or concerns prior to your next visit.  Dr. Caralee Ates

## 2022-11-07 LAB — NOVEL CORONAVIRUS, NAA: SARS-CoV-2, NAA: NOT DETECTED

## 2022-11-07 LAB — SPECIMEN STATUS REPORT

## 2023-03-02 ENCOUNTER — Other Ambulatory Visit: Payer: Self-pay | Admitting: Internal Medicine

## 2023-03-02 DIAGNOSIS — I10 Essential (primary) hypertension: Secondary | ICD-10-CM

## 2023-03-03 NOTE — Telephone Encounter (Signed)
Requested medication (s) are due for refill today:   Yes  Requested medication (s) are on the active medication list:   Yes  Future visit scheduled:   Yes 10/1   Last ordered: 09/01/2022 #90, 1 refill  Unable to refill because Cr. And potassium are due per protocol.   Requested Prescriptions  Pending Prescriptions Disp Refills   lisinopril (ZESTRIL) 10 MG tablet [Pharmacy Med Name: LISINOPRIL 10MG  TABLETS] 90 tablet 1    Sig: TAKE 1 TABLET(10 MG) BY MOUTH DAILY     Cardiovascular:  ACE Inhibitors Failed - 03/02/2023  6:20 AM      Failed - Cr in normal range and within 180 days    Creat  Date Value Ref Range Status  12/12/2021 1.02 0.60 - 1.29 mg/dL Final         Failed - K in normal range and within 180 days    Potassium  Date Value Ref Range Status  12/12/2021 4.0 3.5 - 5.3 mmol/L Final         Passed - Patient is not pregnant      Passed - Last BP in normal range    BP Readings from Last 1 Encounters:  11/05/22 132/88         Passed - Valid encounter within last 6 months    Recent Outpatient Visits           3 months ago Viral upper respiratory tract infection   [facility] Margarita Mail, DO   6 months ago Prediabetes   [facility] Margarita Mail, DO   1 year ago Essential hypertension, benign   [facility] Margarita Mail, DO   1 year ago Inflamed sebaceous cyst   [facility] Cory, MD   1 year ago Essential hypertension, benign   [facility] Margarita Mail, DO       Future Appointments             In 6 days MacDiarmid, Lorin Picket, MD [facility] [place]   In 2 weeks Margarita Mail, DO [facility]

## 2023-03-04 ENCOUNTER — Ambulatory Visit: Payer: BC Managed Care – PPO | Admitting: Internal Medicine

## 2023-03-06 ENCOUNTER — Ambulatory Visit: Payer: BC Managed Care – PPO | Admitting: Internal Medicine

## 2023-03-09 ENCOUNTER — Encounter: Payer: Self-pay | Admitting: Urology

## 2023-03-09 ENCOUNTER — Telehealth: Payer: Self-pay | Admitting: *Deleted

## 2023-03-09 ENCOUNTER — Ambulatory Visit (INDEPENDENT_AMBULATORY_CARE_PROVIDER_SITE_OTHER): Payer: BC Managed Care – PPO | Admitting: Urology

## 2023-03-09 VITALS — BP 122/82 | HR 78 | Ht 70.0 in | Wt 263.0 lb

## 2023-03-09 DIAGNOSIS — R350 Frequency of micturition: Secondary | ICD-10-CM | POA: Diagnosis not present

## 2023-03-09 DIAGNOSIS — N138 Other obstructive and reflux uropathy: Secondary | ICD-10-CM

## 2023-03-09 DIAGNOSIS — R351 Nocturia: Secondary | ICD-10-CM | POA: Diagnosis not present

## 2023-03-09 LAB — URINALYSIS, COMPLETE
Bilirubin, UA: NEGATIVE
Glucose, UA: NEGATIVE
Ketones, UA: NEGATIVE
Leukocytes,UA: NEGATIVE
Nitrite, UA: NEGATIVE
Protein,UA: NEGATIVE
RBC, UA: NEGATIVE
Specific Gravity, UA: 1.025 (ref 1.005–1.030)
Urobilinogen, Ur: 0.2 mg/dL (ref 0.2–1.0)
pH, UA: 6.5 (ref 5.0–7.5)

## 2023-03-09 LAB — MICROSCOPIC EXAMINATION

## 2023-03-09 MED ORDER — TAMSULOSIN HCL 0.4 MG PO CAPS
0.8000 mg | ORAL_CAPSULE | Freq: Every day | ORAL | 3 refills | Status: DC
Start: 2023-03-09 — End: 2023-09-18

## 2023-03-09 NOTE — Addendum Note (Signed)
Addended by: Sueanne Margarita on: 03/09/2023 03:59 PM   Modules accepted: Orders

## 2023-03-09 NOTE — Progress Notes (Signed)
03/09/2023 1:12 PM   Jeffrey Rivers 1970/12/18 [id number]  Referring provider: Margarita Mail, DO [address] [place],  [place] [address]  Chief Complaint  Patient presents with   Follow-up    1 year follow-up    HPI: I reviewed the medical record.  PSA 0.39 December 12, 2021 He still has a very slow flow especially if he does not take the Flomax 0.8 mg.  He will get urgency and then only voided with a slower stream. We went over the role of urodynamics again and the concept of prostate obstruction and I mentioned various procedures.  We talked about various side effects of procedures and when he could work again.  He does not do a lot of heavy lifting straining.  Many questions were answered.  He has spoken to Dr. [name] in the past about surgical procedures has a middle lobe.  I renewed medication and I will see in a year. He will call for urodynamics if he would like to proceed with possible minimally invasive therapy or surgery. He has seen Dr. [name] in the past. Certainly he can get his care here or [facility].   Today Patient saw a nurse practitioner in March 2024 and was kept on Flomax.  Nighttime frequency also discussed.  PSA 0.39 in June 2023 Low varies but often poor.  Sometimes has a little bit of urge incontinence.  Gets up 3 times at night.  Urgency can be quite severe at times.  He can get small-volume frequency if he drinks a large bottle of water. Hard to feel prostate on body habitus but lower aspect was benign feeling     PMH: Past Medical History:  Diagnosis Date   Essential hypertension, benign 11/05/2015   Hypertension     Surgical History: Past Surgical History:  Procedure Laterality Date   APPENDECTOMY     COLONOSCOPY WITH PROPOFOL N/A 08/13/2021   Procedure: COLONOSCOPY WITH PROPOFOL;  Surgeon: Wyline Mood, MD;  Location: [facility] ENDOSCOPY;  Service: Gastroenterology;  Laterality: N/A;   HERNIA REPAIR Right 06/05/2014   Inguinal     Home Medications:  Allergies as of 03/09/2023   No Known Allergies      Medication List        Accurate as of March 09, 2023  1:12 PM. If you have any questions, ask your nurse or doctor.          STOP taking these medications    benzonatate 100 MG capsule Commonly known as: TESSALON Stopped by: Lorin Picket A Darinda [place]   methylPREDNISolone 4 MG Tbpk tablet Commonly known as: MEDROL DOSEPAK Stopped by: Lorin Picket A Zylen Wenig       TAKE these medications    amLODipine 10 MG tablet Commonly known as: NORVASC TAKE 1 TABLET(10 MG) BY MOUTH DAILY   lisinopril 10 MG tablet Commonly known as: ZESTRIL TAKE 1 TABLET(10 MG) BY MOUTH DAILY   tamsulosin 0.4 MG Caps capsule Commonly known as: FLOMAX Take 2 capsules (0.8 mg total) by mouth daily.        Allergies: No Known Allergies  Family History: Family History  Problem Relation Age of Onset   Cancer Father 31       colon   Prostate cancer Neg Hx    Kidney cancer Neg Hx     Social History:  reports that he has never smoked. He has never been exposed to tobacco smoke. He has never used smokeless tobacco. He reports that he does not drink alcohol and  does not use drugs.  ROS:                                        Physical Exam: BP 122/82   Pulse 78   Ht 5\' 10"  (1.778 m)   Wt 119.3 kg   BMI 37.74 kg/m   Constitutional:  Alert and oriented, No acute distress. HEENT: [place] AT, moist mucus membranes.  Trachea midline, no masses.   Laboratory Data: Lab Results  Component Value Date   WBC 3.5 (L) 12/12/2021   HGB 14.9 12/12/2021   HCT 44.1 12/12/2021   MCV 81.8 12/12/2021   PLT 210 12/12/2021    Lab Results  Component Value Date   CREATININE 1.02 12/12/2021    Lab Results  Component Value Date   PSA 0.39 12/12/2021   PSA 0.39 08/09/2020   PSA 0.4 02/02/2017    Lab Results  Component Value Date   TESTOSTERONE 172 (L) 12/12/2021    Lab Results  Component Value Date    HGBA1C 6.0 (A) 09/01/2022    Urinalysis    Component Value Date/Time   APPEARANCEUR Clear 09/05/2022 0930   GLUCOSEU Negative 09/05/2022 0930   BILIRUBINUR Negative 09/05/2022 0930   PROTEINUR Negative 09/05/2022 0930   NITRITE Negative 09/05/2022 0930   LEUKOCYTESUR Negative 09/05/2022 0930    Pertinent Imaging: Urine negative  Assessment & Plan: Patient will continue on Flomax.  Briefly reviewed the role of urodynamics and potential surgery.  Call if PSA abnormal and see in a year  Similar to other visits multiple questions answered.  Role of urodynamics and potential prostate procedures discussed.  Multiple questions answered regarding testing and procedures.  He wants to think about it and will call if he wants urodynamics.  90 days and 3 refills of Flomax sent to pharmacy.  I did not add a beta 3 agonist today as he has significant flow symptoms.  I urged him to perhaps to do the testing since he has been bothered by the symptoms for a long time.  He wants to think about it.  He will be interesting to see what the findings would be on urodynamics and cystoscopy because he is quite young having the symptoms dating back to 2017.  I will make sure I get a postvoid residual next year  1. Urinary frequency  - Urinalysis, Complete   No follow-ups on file.  Martina Sinner, MD  [facility] [address], [address] [place], [place] [address] [phone]

## 2023-03-09 NOTE — Telephone Encounter (Signed)
Pt did not get his PSA today, I called him to discuss and pt stated he will "think" about it. I offered to schedule and pt declined that he has a lot going on with work and can't get off.

## 2023-03-16 NOTE — Progress Notes (Deleted)
Established Patient Office Visit  Subjective:  Patient ID: Jeffrey Rivers, male    DOB: Aug 30, 1970  Age: 52 y.o. MRN: [id number]  CC:  No chief complaint on file.   HPI Jeffrey Rivers presents for follow up on chronic medical conditions.  Hypertension: -Medications: Lisinopril 10 mg, Amlodipine 10 mg -Patient is mostly compliant with above medications and reports no side effects generally. Sometimes forgets to take medications.  -Checking BP at home (average): not checking -Denies any SOB, CP, vision changes, LE edema or symptoms of hypotension  HLD: -Medications: None -Last lipid panel: 8/22: Lipid Panel     Component Value Date/Time   CHOL 202 (H) 12/12/2021 1626   CHOL 217 (H) 11/05/2015 1518   TRIG 72 12/12/2021 1626   HDL 52 12/12/2021 1626   HDL 59 11/05/2015 1518   CHOLHDL 3.9 12/12/2021 1626   VLDL 11 02/02/2017 0838   LDLCALC 134 (H) 12/12/2021 1626   LABVLDL 12 11/05/2015 1518    The 10-year ASCVD risk score (Arnett DK, et al., 2019) is: 8%   Values used to calculate the score:     Age: 6 years     Sex: Male     Is Non-Hispanic African American: Yes     Diabetic: No     Tobacco smoker: No     Systolic Blood Pressure: 122 mmHg     Is BP treated: Yes     HDL Cholesterol: 52 mg/dL     Total Cholesterol: 202 mg/dL  Pre-Diabetes: -Last W0J: 3/24 6.0% -Not currently on medications  BPH: -Currently on Flomax 0.4 mg BID, having more nocturia lately and increased urinary urgency  -Denies dysuria, hematuria  Low Testosterone/Fatigue: -Still complaining of fatigue, testosterone checked last June and was low at 172 -Patient has urologist, will call to schedule  Health Maintenance: -Blood work UTD -Colon cancer screening: colonoscopy 2/23, repeat in 5 years  -Tdap due  Past Medical History:  Diagnosis Date   Essential hypertension, benign 11/05/2015   Hypertension     Past Surgical History:  Procedure Laterality Date   APPENDECTOMY      COLONOSCOPY WITH PROPOFOL N/A 08/13/2021   Procedure: COLONOSCOPY WITH PROPOFOL;  Surgeon: Wyline Mood, MD;  Location: [facility] ENDOSCOPY;  Service: Gastroenterology;  Laterality: N/A;   HERNIA REPAIR Right 06/05/2014   Inguinal    Family History  Problem Relation Age of Onset   Cancer Father 31       colon   Prostate cancer Neg Hx    Kidney cancer Neg Hx     Social History   Socioeconomic History   Marital status: Married    Spouse name: Not on file   Number of children: Not on file   Years of education: Not on file   Highest education level: Not on file  Occupational History   Not on file  Tobacco Use   Smoking status: Never    Passive exposure: Never   Smokeless tobacco: Never  Vaping Use   Vaping status: Never Used  Substance and Sexual Activity   Alcohol use: No   Drug use: No   Sexual activity: Not Currently  Other Topics Concern   Not on file  Social History Narrative   Not on file   Social Determinants of Health   Financial Resource Strain: Low Risk  (08/09/2020)   Overall Financial Resource Strain (CARDIA)    Difficulty of Paying Living Expenses: Not hard at all  Food Insecurity: No Food Insecurity (08/09/2020)  Hunger Vital Sign    Worried About Running Out of Food in the Last Year: Never true    Ran Out of Food in the Last Year: Never true  Transportation Needs: No Transportation Needs (08/09/2020)   PRAPARE - Administrator, Civil Service (Medical): No    Lack of Transportation (Non-Medical): No  Physical Activity: Insufficiently Active (08/09/2020)   Exercise Vital Sign    Days of Exercise per Week: 3 days    Minutes of Exercise per Session: 40 min  Stress: No Stress Concern Present (08/09/2020)   [employer] of Occupational Health - Occupational Stress Questionnaire    Feeling of Stress : Only a little  Social Connections: Socially Integrated (08/09/2020)   Social Connection and Isolation Panel [NHANES]    Frequency of Communication  with Friends and Family: More than three times a week    Frequency of Social Gatherings with Friends and Family: More than three times a week    Attends Religious Services: More than 4 times per year    Active Member of [employer] or Organizations: No    Attends Engineer, structural: More than 4 times per year    Marital Status: Married  Catering manager Violence: Not At Risk (08/09/2020)   Humiliation, Afraid, Rape, and Kick questionnaire    Fear of Current or Ex-Partner: No    Emotionally Abused: No    Physically Abused: No    Sexually Abused: No    Outpatient Medications Prior to Visit  Medication Sig Dispense Refill   amLODipine (NORVASC) 10 MG tablet TAKE 1 TABLET(10 MG) BY MOUTH DAILY 90 tablet 1   lisinopril (ZESTRIL) 10 MG tablet TAKE 1 TABLET(10 MG) BY MOUTH DAILY 90 tablet 0   tamsulosin (FLOMAX) 0.4 MG CAPS capsule Take 2 capsules (0.8 mg total) by mouth daily. 180 capsule 3   No facility-administered medications prior to visit.    No Known Allergies  ROS Review of Systems  Constitutional:  Positive for fatigue. Negative for chills and fever.  Eyes:  Negative for visual disturbance.  Respiratory:  Negative for cough and shortness of breath.   Cardiovascular:  Negative for chest pain.  Genitourinary:  Positive for difficulty urinating, frequency and urgency. Negative for dysuria and hematuria.  Neurological:  Negative for dizziness and headaches.      Objective:    Physical Exam Constitutional:      Appearance: Normal appearance.  HENT:     Head: Normocephalic and atraumatic.  Eyes:     Conjunctiva/sclera: Conjunctivae normal.  Cardiovascular:     Rate and Rhythm: Normal rate and regular rhythm.  Pulmonary:     Effort: Pulmonary effort is normal.     Breath sounds: Normal breath sounds.  Musculoskeletal:     Right knee: Normal. No swelling, effusion or erythema. No tenderness.  Skin:    General: Skin is warm and dry.  Neurological:     General: No  focal deficit present.     Mental Status: He is alert. Mental status is at baseline.  Psychiatric:        Mood and Affect: Mood normal.        Behavior: Behavior normal.     There were no vitals taken for this visit. Wt Readings from Last 3 Encounters:  03/09/23 263 lb (119.3 kg)  11/05/22 266 lb 12.8 oz (121 kg)  09/05/22 250 lb (113.4 kg)     Health Maintenance Due  Topic Date Due   Zoster Vaccines- Shingrix (  1 of 2) Never done   COVID-19 Vaccine (4 - 2023-24 season) 02/15/2023    There are no preventive care reminders to display for this patient.  Lab Results  Component Value Date   TSH 1.45 12/12/2021   Lab Results  Component Value Date   WBC 3.5 (L) 12/12/2021   HGB 14.9 12/12/2021   HCT 44.1 12/12/2021   MCV 81.8 12/12/2021   PLT 210 12/12/2021   Lab Results  Component Value Date   NA 138 12/12/2021   K 4.0 12/12/2021   CO2 25 12/12/2021   GLUCOSE 92 12/12/2021   BUN 15 12/12/2021   CREATININE 1.02 12/12/2021   BILITOT 0.5 12/12/2021   ALKPHOS 43 02/02/2017   AST 23 12/12/2021   ALT 28 12/12/2021   PROT 7.0 12/12/2021   ALBUMIN 4.3 02/02/2017   CALCIUM 9.2 12/12/2021   ANIONGAP 6 08/23/2015   EGFR 90 12/12/2021   Lab Results  Component Value Date   CHOL 202 (H) 12/12/2021   Lab Results  Component Value Date   HDL 52 12/12/2021   Lab Results  Component Value Date   LDLCALC 134 (H) 12/12/2021   Lab Results  Component Value Date   TRIG 72 12/12/2021   Lab Results  Component Value Date   CHOLHDL 3.9 12/12/2021   Lab Results  Component Value Date   HGBA1C 6.0 (A) 09/01/2022      Assessment & Plan:   1. Essential hypertension, benign: Chronic and stable. Continue Amlodipine 10 mg, Lisinopril 10 mg, both refilled.  - amLODipine (NORVASC) 10 MG tablet; TAKE 1 TABLET(10 MG) BY MOUTH DAILY  Dispense: 90 tablet; Refill: 1 - lisinopril (ZESTRIL) 10 MG tablet; Take 1 tablet (10 mg total) by mouth daily.  Dispense: 90 tablet; Refill:  1  2. Mixed hyperlipidemia: Reviewed labs with the patient from June, LDL elevated but ASCVD risk borderline. Plan to recheck fasting labs in 6 months.  3. Prediabetes: A1c 6.0% today.  - POCT HgB A1C  4. 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   5. Need for Tdap vaccination: Tdap vaccine administered today.  - Tdap vaccine greater than or equal to 7yo IM    Follow-up: No follow-ups on file.    Margarita Mail, DO

## 2023-03-17 ENCOUNTER — Ambulatory Visit: Payer: BC Managed Care – PPO | Admitting: Internal Medicine

## 2023-04-07 NOTE — Progress Notes (Unsigned)
Established Patient Office Visit  Subjective:  Patient ID: Jeffrey Rivers, male    DOB: Aug 30, 1970  Age: 52 y.o. MRN: [id number]  CC:  No chief complaint on file.   HPI Jeffrey Rivers presents for follow up on chronic medical conditions.  Hypertension: -Medications: Lisinopril 10 mg, Amlodipine 10 mg -Patient is mostly compliant with above medications and reports no side effects generally. Sometimes forgets to take medications.  -Checking BP at home (average): not checking -Denies any SOB, CP, vision changes, LE edema or symptoms of hypotension  HLD: -Medications: None -Last lipid panel: 8/22: Lipid Panel     Component Value Date/Time   CHOL 202 (H) 12/12/2021 1626   CHOL 217 (H) 11/05/2015 1518   TRIG 72 12/12/2021 1626   HDL 52 12/12/2021 1626   HDL 59 11/05/2015 1518   CHOLHDL 3.9 12/12/2021 1626   VLDL 11 02/02/2017 0838   LDLCALC 134 (H) 12/12/2021 1626   LABVLDL 12 11/05/2015 1518    The 10-year ASCVD risk score (Arnett DK, et al., 2019) is: 8%   Values used to calculate the score:     Age: 6 years     Sex: Male     Is Non-Hispanic African American: Yes     Diabetic: No     Tobacco smoker: No     Systolic Blood Pressure: 122 mmHg     Is BP treated: Yes     HDL Cholesterol: 52 mg/dL     Total Cholesterol: 202 mg/dL  Pre-Diabetes: -Last W0J: 3/24 6.0% -Not currently on medications  BPH: -Currently on Flomax 0.4 mg BID, having more nocturia lately and increased urinary urgency  -Denies dysuria, hematuria  Low Testosterone/Fatigue: -Still complaining of fatigue, testosterone checked last June and was low at 172 -Patient has urologist, will call to schedule  Health Maintenance: -Blood work UTD -Colon cancer screening: colonoscopy 2/23, repeat in 5 years  -Tdap due  Past Medical History:  Diagnosis Date   Essential hypertension, benign 11/05/2015   Hypertension     Past Surgical History:  Procedure Laterality Date   APPENDECTOMY      COLONOSCOPY WITH PROPOFOL N/A 08/13/2021   Procedure: COLONOSCOPY WITH PROPOFOL;  Surgeon: Wyline Mood, MD;  Location: [facility] ENDOSCOPY;  Service: Gastroenterology;  Laterality: N/A;   HERNIA REPAIR Right 06/05/2014   Inguinal    Family History  Problem Relation Age of Onset   Cancer Father 31       colon   Prostate cancer Neg Hx    Kidney cancer Neg Hx     Social History   Socioeconomic History   Marital status: Married    Spouse name: Not on file   Number of children: Not on file   Years of education: Not on file   Highest education level: Not on file  Occupational History   Not on file  Tobacco Use   Smoking status: Never    Passive exposure: Never   Smokeless tobacco: Never  Vaping Use   Vaping status: Never Used  Substance and Sexual Activity   Alcohol use: No   Drug use: No   Sexual activity: Not Currently  Other Topics Concern   Not on file  Social History Narrative   Not on file   Social Determinants of Health   Financial Resource Strain: Low Risk  (08/09/2020)   Overall Financial Resource Strain (CARDIA)    Difficulty of Paying Living Expenses: Not hard at all  Food Insecurity: No Food Insecurity (08/09/2020)  Hunger Vital Sign    Worried About Running Out of Food in the Last Year: Never true    Ran Out of Food in the Last Year: Never true  Transportation Needs: No Transportation Needs (08/09/2020)   PRAPARE - Administrator, Civil Service (Medical): No    Lack of Transportation (Non-Medical): No  Physical Activity: Insufficiently Active (08/09/2020)   Exercise Vital Sign    Days of Exercise per Week: 3 days    Minutes of Exercise per Session: 40 min  Stress: No Stress Concern Present (08/09/2020)   [employer] of Occupational Health - Occupational Stress Questionnaire    Feeling of Stress : Only a little  Social Connections: Socially Integrated (08/09/2020)   Social Connection and Isolation Panel [NHANES]    Frequency of Communication  with Friends and Family: More than three times a week    Frequency of Social Gatherings with Friends and Family: More than three times a week    Attends Religious Services: More than 4 times per year    Active Member of [employer] or Organizations: No    Attends Engineer, structural: More than 4 times per year    Marital Status: Married  Catering manager Violence: Not At Risk (08/09/2020)   Humiliation, Afraid, Rape, and Kick questionnaire    Fear of Current or Ex-Partner: No    Emotionally Abused: No    Physically Abused: No    Sexually Abused: No    Outpatient Medications Prior to Visit  Medication Sig Dispense Refill   amLODipine (NORVASC) 10 MG tablet TAKE 1 TABLET(10 MG) BY MOUTH DAILY 90 tablet 1   lisinopril (ZESTRIL) 10 MG tablet TAKE 1 TABLET(10 MG) BY MOUTH DAILY 90 tablet 0   tamsulosin (FLOMAX) 0.4 MG CAPS capsule Take 2 capsules (0.8 mg total) by mouth daily. 180 capsule 3   No facility-administered medications prior to visit.    No Known Allergies  ROS Review of Systems  Constitutional:  Positive for fatigue. Negative for chills and fever.  Eyes:  Negative for visual disturbance.  Respiratory:  Negative for cough and shortness of breath.   Cardiovascular:  Negative for chest pain.  Genitourinary:  Positive for difficulty urinating, frequency and urgency. Negative for dysuria and hematuria.  Neurological:  Negative for dizziness and headaches.      Objective:    Physical Exam Constitutional:      Appearance: Normal appearance.  HENT:     Head: Normocephalic and atraumatic.  Eyes:     Conjunctiva/sclera: Conjunctivae normal.  Cardiovascular:     Rate and Rhythm: Normal rate and regular rhythm.  Pulmonary:     Effort: Pulmonary effort is normal.     Breath sounds: Normal breath sounds.  Musculoskeletal:     Right knee: Normal. No swelling, effusion or erythema. No tenderness.  Skin:    General: Skin is warm and dry.  Neurological:     General: No  focal deficit present.     Mental Status: He is alert. Mental status is at baseline.  Psychiatric:        Mood and Affect: Mood normal.        Behavior: Behavior normal.     There were no vitals taken for this visit. Wt Readings from Last 3 Encounters:  03/09/23 263 lb (119.3 kg)  11/05/22 266 lb 12.8 oz (121 kg)  09/05/22 250 lb (113.4 kg)     Health Maintenance Due  Topic Date Due   Zoster Vaccines- Shingrix (  1 of 2) Never done   COVID-19 Vaccine (4 - 2023-24 season) 02/15/2023    There are no preventive care reminders to display for this patient.  Lab Results  Component Value Date   TSH 1.45 12/12/2021   Lab Results  Component Value Date   WBC 3.5 (L) 12/12/2021   HGB 14.9 12/12/2021   HCT 44.1 12/12/2021   MCV 81.8 12/12/2021   PLT 210 12/12/2021   Lab Results  Component Value Date   NA 138 12/12/2021   K 4.0 12/12/2021   CO2 25 12/12/2021   GLUCOSE 92 12/12/2021   BUN 15 12/12/2021   CREATININE 1.02 12/12/2021   BILITOT 0.5 12/12/2021   ALKPHOS 43 02/02/2017   AST 23 12/12/2021   ALT 28 12/12/2021   PROT 7.0 12/12/2021   ALBUMIN 4.3 02/02/2017   CALCIUM 9.2 12/12/2021   ANIONGAP 6 08/23/2015   EGFR 90 12/12/2021   Lab Results  Component Value Date   CHOL 202 (H) 12/12/2021   Lab Results  Component Value Date   HDL 52 12/12/2021   Lab Results  Component Value Date   LDLCALC 134 (H) 12/12/2021   Lab Results  Component Value Date   TRIG 72 12/12/2021   Lab Results  Component Value Date   CHOLHDL 3.9 12/12/2021   Lab Results  Component Value Date   HGBA1C 6.0 (A) 09/01/2022      Assessment & Plan:   1. Essential hypertension, benign: Chronic and stable. Continue Amlodipine 10 mg, Lisinopril 10 mg, both refilled.  - amLODipine (NORVASC) 10 MG tablet; TAKE 1 TABLET(10 MG) BY MOUTH DAILY  Dispense: 90 tablet; Refill: 1 - lisinopril (ZESTRIL) 10 MG tablet; Take 1 tablet (10 mg total) by mouth daily.  Dispense: 90 tablet; Refill:  1  2. Mixed hyperlipidemia: Reviewed labs with the patient from June, LDL elevated but ASCVD risk borderline. Plan to recheck fasting labs in 6 months.  3. Prediabetes: A1c 6.0% today.  - POCT HgB A1C  4. 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   5. Need for Tdap vaccination: Tdap vaccine administered today.  - Tdap vaccine greater than or equal to 7yo IM    Follow-up: No follow-ups on file.    Margarita Mail, DO

## 2023-04-09 ENCOUNTER — Ambulatory Visit (INDEPENDENT_AMBULATORY_CARE_PROVIDER_SITE_OTHER): Payer: BC Managed Care – PPO | Admitting: Internal Medicine

## 2023-04-09 ENCOUNTER — Encounter: Payer: Self-pay | Admitting: Internal Medicine

## 2023-04-09 VITALS — BP 130/86 | HR 75 | Temp 98.0°F | Resp 18 | Ht 70.0 in | Wt 262.2 lb

## 2023-04-09 DIAGNOSIS — I10 Essential (primary) hypertension: Secondary | ICD-10-CM

## 2023-04-09 DIAGNOSIS — Z6837 Body mass index (BMI) 37.0-37.9, adult: Secondary | ICD-10-CM

## 2023-04-09 DIAGNOSIS — E782 Mixed hyperlipidemia: Secondary | ICD-10-CM | POA: Diagnosis not present

## 2023-04-09 DIAGNOSIS — E66812 Obesity, class 2: Secondary | ICD-10-CM

## 2023-04-09 DIAGNOSIS — R7303 Prediabetes: Secondary | ICD-10-CM | POA: Diagnosis not present

## 2023-04-09 MED ORDER — AMLODIPINE BESYLATE 10 MG PO TABS
ORAL_TABLET | ORAL | 1 refills | Status: DC
Start: 2023-04-09 — End: 2023-10-12

## 2023-04-09 MED ORDER — LISINOPRIL 10 MG PO TABS
10.0000 mg | ORAL_TABLET | Freq: Every day | ORAL | 1 refills | Status: DC
Start: 2023-04-09 — End: 2023-10-12

## 2023-04-09 MED ORDER — WEGOVY 0.25 MG/0.5ML ~~LOC~~ SOAJ
0.2500 mg | SUBCUTANEOUS | 0 refills | Status: DC
Start: 2023-04-09 — End: 2023-07-13

## 2023-04-09 NOTE — Patient Instructions (Addendum)
It was great seeing you today!  Plan discussed at today's visit: -Blood work ordered today, results will be uploaded to MyChart.  -Medications refilled -Start Wegovy 0.25 mg weekly   Follow up in: Call to schedule appointment 3-4 weeks after first injection   Take care and let us know if you have any questions or concerns prior to your next visit.  Dr. Caralee Ates  Semaglutide Injection (Weight Management) What is this medication? SEMAGLUTIDE (SEM a GLOO tide) promotes weight loss. It may also be used to maintain weight loss.  It works by decreasing appetite. It can be used to lower the risk of heart attack and stroke in people affected by excess weight. Changes to diet and exercise are often combined with this medication. This medicine may be used for other purposes; ask your health care provider or pharmacist if you have questions. COMMON BRAND NAME(S): [id number] What should I tell my care team before I take this medication? They need to know if you have any of these conditions: Diabetes Eye disease caused by diabetes History of depression or other mental health conditions Kidney disease Pancreatic disease Personal or family history of MEN 2, a condition that causes endocrine gland tumors Personal or family history of thyroid cancer Suicidal thoughts, plans, or attempt by you or a family member An unusual or allergic reaction to semaglutide, other medications, foods, dyes, or preservatives Pregnant or trying to get pregnant Breastfeeding How should I use this medication? This medication is injected under the skin. You will be taught how to prepare and give it. Take it as directed on the prescription label. It is given once every week (every 7 days). Keep taking it unless your care team tells you to stop. It is important that you put your used needles and pens in a special sharps container. Do not put them in a trash can. If you do not have a sharps container, call your pharmacist or care  team to get one. A special MedGuide will be given to you by the pharmacist with each prescription and refill. Be sure to read this information carefully each time. This medication comes with INSTRUCTIONS FOR USE. Ask your pharmacist for directions on how to use this medication. Read the information carefully. Talk to your pharmacist or care team if you have questions. Talk to your care team about the use of this medication in children. While it may be prescribed for children as young as 12 years for selected conditions, precautions do apply. Overdosage: If you think you have taken too much of this medicine contact a poison control center or emergency room at once. NOTE: This medicine is only for you. Do not share this medicine with others. What if I miss a dose? If you miss a dose and the next scheduled dose is more than 2 days away, take the missed dose as soon as possible. If you miss a dose and the next scheduled dose is less than 2 days away, do not take the missed dose. Take the next dose at your regular time. Do not take double or extra doses. If you miss your dose for 2 weeks or more, take the next dose at your regular time or call your care team to talk about how to restart this medication. What may interact with this medication? Insulin and other medications for diabetes This list may not describe all possible interactions. Give your health care provider a list of all the medicines, herbs, non-prescription drugs, or dietary supplements you use.  Also tell them if you smoke, drink alcohol, or use illegal drugs. Some items may interact with your medicine. What should I watch for while using this medication? Visit your care team for regular checks on your progress. It may be some time before you see the benefit from this medication. Drink plenty of fluids while taking this medication. Check with your care team if you have severe diarrhea, nausea, and vomiting, or if you sweat a lot. The loss of too  much body fluid may make it dangerous for you to take this medication. This medication may affect blood sugar levels. Ask your care team if changes in diet or medications are needed if you have diabetes. Talk to your care team if you may be pregnant. Losing weight while pregnant is not advised and may cause harm to the fetus. Talk to your care team for more information. What side effects may I notice from receiving this medication? Side effects that you should report to your care team as soon as possible: Allergic reactions--skin rash, itching, hives, swelling of the face, lips, tongue, or throat Change in vision Dehydration--increased thirst, dry mouth, feeling faint or lightheaded, headache, dark yellow or brown urine Gallbladder problems--severe stomach pain, nausea, vomiting, fever Heart palpitations--rapid, pounding, or irregular heartbeat Kidney injury--decrease in the amount of urine, swelling of the ankles, hands, or feet Pancreatitis--severe stomach pain that spreads to your back or gets worse after eating or when touched, fever, nausea, vomiting Thoughts of suicide or self-harm, worsening mood, feelings of depression Thyroid cancer--new mass or lump in the neck, pain or trouble swallowing, trouble breathing, hoarseness Side effects that usually do not require medical attention (report these to your care team if they continue or are bothersome): Diarrhea Loss of appetite Nausea Upset stomach This list may not describe all possible side effects. Call your doctor for medical advice about side effects. You may report side effects to FDA at 1-800-FDA-1088. Where should I keep my medication? Keep out of the reach of children and pets. Refrigeration (preferred): Store in the refrigerator. Do not freeze. Keep this medication in the original container until you are ready to take it. Get rid of any unused medication after the expiration date. Room temperature: If needed, prior to cap removal,  the pen can be stored at room temperature for up to 28 days. Protect from light. If it is stored at room temperature, get rid of any unused medication after 28 days or after it expires, whichever is first. It is important to get rid of the medication as soon as you no longer need it or it is expired. You can do this in two ways: Take the medication to a medication take-back program. Check with your pharmacy or law enforcement to find a location. If you cannot return the medication, follow the directions in the MedGuide. NOTE: This sheet is a summary. It may not cover all possible information. If you have questions about this medicine, talk to your doctor, pharmacist, or health care provider.  2024 Elsevier/Gold Standard (2022-09-03 00:00:00)

## 2023-04-10 ENCOUNTER — Other Ambulatory Visit: Payer: Self-pay | Admitting: Internal Medicine

## 2023-04-10 DIAGNOSIS — E782 Mixed hyperlipidemia: Secondary | ICD-10-CM

## 2023-04-10 LAB — COMPLETE METABOLIC PANEL WITH GFR
AG Ratio: 1.9 (calc) (ref 1.0–2.5)
ALT: 27 U/L (ref 9–46)
AST: 21 U/L (ref 10–35)
Albumin: 4.4 g/dL (ref 3.6–5.1)
Alkaline phosphatase (APISO): 36 U/L (ref 35–144)
BUN: 15 mg/dL (ref 7–25)
CO2: 26 mmol/L (ref 20–32)
Calcium: 9.5 mg/dL (ref 8.6–10.3)
Chloride: 103 mmol/L (ref 98–110)
Creat: 1.04 mg/dL (ref 0.70–1.30)
Globulin: 2.3 g/dL (ref 1.9–3.7)
Glucose, Bld: 97 mg/dL (ref 65–99)
Potassium: 4.2 mmol/L (ref 3.5–5.3)
Sodium: 138 mmol/L (ref 135–146)
Total Bilirubin: 0.3 mg/dL (ref 0.2–1.2)
Total Protein: 6.7 g/dL (ref 6.1–8.1)
eGFR: 87 mL/min/{1.73_m2} (ref >=60)

## 2023-04-10 LAB — CBC WITH DIFFERENTIAL/PLATELET
Absolute Lymphocytes: 2262 {cells}/uL (ref 850–3900)
Absolute Monocytes: 262 {cells}/uL (ref 200–950)
Basophils Absolute: 22 {cells}/uL (ref 0–200)
Basophils Relative: 0.5 %
Eosinophils Absolute: 69 {cells}/uL (ref 15–500)
Eosinophils Relative: 1.6 %
HCT: 43.8 % (ref 38.5–50.0)
Hemoglobin: 14.3 g/dL (ref 13.2–17.1)
MCH: 27 pg (ref 27.0–33.0)
MCHC: 32.6 g/dL (ref 32.0–36.0)
MCV: 82.6 fL (ref 80.0–100.0)
MPV: 11.4 fL (ref 7.5–12.5)
Monocytes Relative: 6.1 %
Neutro Abs: 1686 {cells}/uL (ref 1500–7800)
Neutrophils Relative %: 39.2 %
Platelets: 227 10*3/uL (ref 140–400)
RBC: 5.3 10*6/uL (ref 4.20–5.80)
RDW: 13 % (ref 11.0–15.0)
Total Lymphocyte: 52.6 %
WBC: 4.3 10*3/uL (ref 3.8–10.8)

## 2023-04-10 LAB — HEMOGLOBIN A1C
Hgb A1c MFr Bld: 6.1 %{Hb} — ABNORMAL HIGH (ref <5.7)
Mean Plasma Glucose: 128 mg/dL
eAG (mmol/L): 7.1 mmol/L

## 2023-04-10 LAB — LIPID PANEL
Cholesterol: 194 mg/dL (ref <200)
HDL: 54 mg/dL (ref >=40)
LDL Cholesterol (Calc): 108 mg/dL — ABNORMAL HIGH
Non-HDL Cholesterol (Calc): 140 mg/dL — ABNORMAL HIGH (ref <130)
Total CHOL/HDL Ratio: 3.6 (calc) (ref <5.0)
Triglycerides: 206 mg/dL — ABNORMAL HIGH (ref <150)

## 2023-04-10 MED ORDER — ROSUVASTATIN CALCIUM 5 MG PO TABS: 5.0000 mg | ORAL_TABLET | Freq: Every day | ORAL | 0 refills | Status: DC

## 2023-06-23 ENCOUNTER — Ambulatory Visit: Payer: BC Managed Care – PPO | Admitting: Internal Medicine

## 2023-06-23 ENCOUNTER — Encounter: Payer: Self-pay | Admitting: Internal Medicine

## 2023-06-23 ENCOUNTER — Other Ambulatory Visit: Payer: Self-pay

## 2023-06-23 VITALS — BP 130/88 | HR 94 | Temp 98.9°F | Resp 16 | Ht 70.0 in | Wt 266.8 lb

## 2023-06-23 DIAGNOSIS — J0110 Acute frontal sinusitis, unspecified: Secondary | ICD-10-CM

## 2023-06-23 DIAGNOSIS — R051 Acute cough: Secondary | ICD-10-CM | POA: Diagnosis not present

## 2023-06-23 LAB — POCT INFLUENZA A/B
Influenza A, POC: NEGATIVE
Influenza B, POC: NEGATIVE

## 2023-06-23 MED ORDER — BENZONATATE 100 MG PO CAPS: 100.0000 mg | ORAL_CAPSULE | Freq: Two times a day (BID) | ORAL | 0 refills | Status: DC | PRN

## 2023-06-23 MED ORDER — AMOXICILLIN-POT CLAVULANATE 875-125 MG PO TABS: 1.0000 | ORAL_TABLET | Freq: Two times a day (BID) | ORAL | 0 refills | Status: AC

## 2023-06-23 NOTE — Progress Notes (Signed)
 06/25/2023 8:51 PM   Jeffrey Rivers 1970/10/22 [id number]  Referring provider: Bernardo Fend, DO [address] [place],  [place] [address]  Urological history: 1. BPH with LU TS -cysto (2017) - prominent obstructing median lobe -Failed alfuzosin  secondary to heart palpitations and drowsiness -Tamsulosin  0.4 mg 2 capsules daily  Chief Complaint  Patient presents with   Nocturia    HPI: Jeffrey Rivers is a 53 y.o. male who presents for urinary frequency and a slow urinary stream with medication being ineffective.  Previous records reviewed.     I PSS 23/5  PVR 2 mL   UA benign   He continues to have issues with nocturia, a weak urinary stream, straining to urinate and frequency.   He has some relief with the tamsulosin , but it does not completely relieve his symptoms.     IPSS     Row Name 06/25/23 1500         International Prostate Symptom Score   How often have you had the sensation of not emptying your bladder? About half the time     How often have you had to urinate less than every two hours? About half the time     How often have you found you stopped and started again several times when you urinated? Less than half the time     How often have you found it difficult to postpone urination? About half the time     How often have you had a weak urinary stream? More than half the time     How often have you had to strain to start urination? More than half the time     How many times did you typically get up at night to urinate? 4 Times     Total IPSS Score 23       Quality of Life due to urinary symptoms   If you were to spend the rest of your life with your urinary condition just the way it is now how would you feel about that? Unhappy               Score:  1-7 Mild 8-19 Moderate 20-35 Severe     PMH: Past Medical History:  Diagnosis Date   Essential hypertension, benign 11/05/2015   Hypertension     Surgical  History: Past Surgical History:  Procedure Laterality Date   APPENDECTOMY     COLONOSCOPY WITH PROPOFOL  N/A 08/13/2021   Procedure: COLONOSCOPY WITH PROPOFOL ;  Surgeon: Therisa Bi, MD;  Location: [facility] ENDOSCOPY;  Service: Gastroenterology;  Laterality: N/A;   HERNIA REPAIR Right 06/05/2014   Inguinal    Home Medications:  Allergies as of 06/25/2023   No Known Allergies      Medication List        Accurate as of June 25, 2023  8:51 PM. If you have any questions, ask your nurse or doctor.          amLODipine  10 MG tablet Commonly known as: NORVASC  TAKE 1 TABLET(10 MG) BY MOUTH DAILY   amoxicillin -clavulanate 875-125 MG tablet Commonly known as: AUGMENTIN  Take 1 tablet by mouth 2 (two) times daily for 5 days.   benzonatate  100 MG capsule Commonly known as: TESSALON  Take 1 capsule (100 mg total) by mouth 2 (two) times daily as needed for cough.   lisinopril  10 MG tablet Commonly known as: ZESTRIL  Take 1 tablet (10 mg total) by mouth daily.   rosuvastatin  5 MG tablet Commonly  known as: Crestor  Take 1 tablet (5 mg total) by mouth daily.   tamsulosin  0.4 MG Caps capsule Commonly known as: FLOMAX  Take 2 capsules (0.8 mg total) by mouth daily.   Wegovy  0.25 MG/0.5ML Soaj Generic drug: Semaglutide -Weight Management Inject 0.25 mg into the skin once a week.        Allergies: No Known Allergies  Family History: Family History  Problem Relation Age of Onset   Cancer Father 55       colon   Prostate cancer Neg Hx    Kidney cancer Neg Hx     Social History:  reports that he has never smoked. He has never been exposed to tobacco smoke. He has never used smokeless tobacco. He reports that he does not drink alcohol and does not use drugs.  ROS: Pertinent ROS in HPI  Physical Exam: BP 139/87   Pulse 69   Ht 5' 10 (1.778 m)   Wt 240 lb (108.9 kg)   BMI 34.44 kg/m   Constitutional:  Well nourished. Alert and oriented, No acute distress. HEENT: [place] AT,  moist mucus membranes.  Trachea midline Cardiovascular: No clubbing, cyanosis, or edema. Respiratory: Normal respiratory effort, no increased work of breathing. Neurologic: Grossly intact, no focal deficits, moving all 4 extremities. Psychiatric: Normal mood and affect.   Laboratory Data: Lab Results  Component Value Date   WBC 4.3 04/09/2023   HGB 14.3 04/09/2023   HCT 43.8 04/09/2023   MCV 82.6 04/09/2023   PLT 227 04/09/2023    Lab Results  Component Value Date   CREATININE 1.04 04/09/2023    Lab Results  Component Value Date   HGBA1C 6.1 (H) 04/09/2023       Component Value Date/Time   CHOL 194 04/09/2023 1518   CHOL 217 (H) 11/05/2015 1518   HDL 54 04/09/2023 1518   HDL 59 11/05/2015 1518   CHOLHDL 3.6 04/09/2023 1518   VLDL 11 02/02/2017 0838   LDLCALC 108 (H) 04/09/2023 1518    Lab Results  Component Value Date   AST 21 04/09/2023   Lab Results  Component Value Date   ALT 27 04/09/2023    Urinalysis See EPIC and HPI I have reviewed the labs.   Pertinent Imaging: Results for orders placed or performed in visit on 06/25/23  Bladder Scan (Post Void Residual) in office   Collection Time: 06/25/23  3:41 PM  Result Value Ref Range   PVR 2.0 WU  Microscopic Examination   Collection Time: 06/25/23  3:42 PM   Urine  Result Value Ref Range   WBC, UA 0-5 0 - 5 /hpf   RBC, Urine 0-2 0 - 2 /hpf   Epithelial Cells (non renal) 0-10 0 - 10 /hpf   Bacteria, UA Few None seen/Few  Urinalysis, Complete   Collection Time: 06/25/23  3:42 PM  Result Value Ref Range   Specific Gravity, UA >1.030 (H) 1.005 - 1.030   pH, UA 5.5 5.0 - 7.5   Color, UA Yellow Yellow   Appearance Ur Clear Clear   Leukocytes,UA Negative Negative   Protein,UA Negative Negative/Trace   Glucose, UA Negative Negative   Ketones, UA Negative Negative   RBC, UA Negative Negative   Bilirubin, UA Negative Negative   Urobilinogen, Ur 0.2 0.2 - 1.0 mg/dL   Nitrite, UA Negative Negative    Microscopic Examination See below:      Assessment & Plan:    1. Nocturia - I explained to the patient that nocturia is  often multi-factorial and difficult to treat.  Sleeping disorders, heart conditions, peripheral vascular disease, diabetes, an enlarged prostate for men, an urethral stricture causing bladder outlet obstruction and/or certain medications can contribute to nocturia. -he may still have some issues with nocturia after a bladder outlet procedure -Dr. Gaston had discussed urodynamics with him in previous appointments and he may need UDS pending findings on cysto/TRUS   2. BPH with LUTS -PSA stable  -PVR < 300 cc  -most bothersome symptoms are hesitancy, urgency and nocturia -continue conservative management, avoiding bladder irritants and timed voiding's -I recommended to him that we pursue a repeat cystoscopy to evaluate for BOO and he will also need a TRUS  - reviewed the cysto procedure and post procedural expectations - explained that the TRUS is an ultrasound of his prostate for sizing and is performed with an ultrasound wand placed in the rectum -will need a PSA prior to the procedures   Return for cysto/TRUS for BOO.  These notes generated with voice recognition software. I apologize for typographical errors.  CLOTILDA HELON RIGGERS  [facility] [address]  [address] [place], [place] [address] [phone]

## 2023-06-23 NOTE — Progress Notes (Signed)
 Acute Office Visit  Subjective:     Patient ID: Jeffrey Rivers, male    DOB: [birth year], 53 y.o.   MRN: [id number]  Chief Complaint  Patient presents with   URI    Fever, cough, weak for 3 days    URI  Associated symptoms include congestion, coughing, headaches, sinus pain and wheezing. Pertinent negatives include no chest pain, ear pain or sore throat.   Patient is in today for cough, fever and weakness for 3 days.   URI Compliant:  -Fever: yes; subjective, not checking temperaure -Cough: yes, productive with green/brown mucus -Shortness of breath: yes with exertion  -Wheezing: yes -Chest congestion: yes -Nasal congestion: yes -Runny nose: yes, clear -Post nasal drip: yes -Sore throat: no -Sinus pressure: yes, right frontal sinus pain  -Headache: yes -Face pain: yes -Ear pain: no  -Ear pressure: no  -Fatigue: yes -Sick contacts: yes, son was sick  -Context: fluctuating -Treatments attempted: cold/sinus and cough syrup    Review of Systems  Constitutional:  Positive for chills and malaise/fatigue. Negative for fever.  HENT:  Positive for congestion and sinus pain. Negative for ear pain and sore throat.   Respiratory:  Positive for cough, sputum production, shortness of breath and wheezing.   Cardiovascular:  Negative for chest pain.  Neurological:  Positive for headaches. Negative for dizziness.        Objective:    There were no vitals taken for this visit. BP Readings from Last 3 Encounters:  04/09/23 130/86  03/09/23 122/82  11/05/22 132/88   Wt Readings from Last 3 Encounters:  04/09/23 262 lb 3.2 oz (118.9 kg)  03/09/23 263 lb (119.3 kg)  11/05/22 266 lb 12.8 oz (121 kg)      Physical Exam Constitutional:      Appearance: Normal appearance.  HENT:     Head: Normocephalic and atraumatic.     Right Ear: Tympanic membrane, ear canal and external ear normal.     Left Ear: Tympanic membrane, ear canal and external ear normal.     Nose:  Congestion present.     Mouth/Throat:     Mouth: Mucous membranes are moist.     Comments: Mild erythema  Eyes:     Conjunctiva/sclera: Conjunctivae normal.  Cardiovascular:     Rate and Rhythm: Normal rate and regular rhythm.  Pulmonary:     Effort: Pulmonary effort is normal.     Breath sounds: Wheezing present. No rhonchi or rales.     Comments: Decreased air movement throughout, inspiratory wheezes in the left base Skin:    General: Skin is warm and dry.  Neurological:     General: No focal deficit present.     Mental Status: He is alert. Mental status is at baseline.  Psychiatric:        Mood and Affect: Mood normal.        Behavior: Behavior normal.     No results found for any visits on 06/23/23.      Assessment & Plan:   1. Acute non-recurrent frontal sinusitis (Primary)/Acute cough: Flu negative, COVID pending. Will prescribe Augmentin  BID x 5 days and cough suppressants. Can continue over the counter decongestant. If symptoms worsen or fail to improve will need chest x-ray.  - amoxicillin -clavulanate (AUGMENTIN ) 875-125 MG tablet; Take 1 tablet by mouth 2 (two) times daily for 5 days.  Dispense: 10 tablet; Refill: 0 - POCT Influenza A/B - Novel Coronavirus, NAA ([facility]) - benzonatate  (TESSALON ) 100 MG capsule; Take 1  capsule (100 mg total) by mouth 2 (two) times daily as needed for cough.  Dispense: 20 capsule; Refill: 0   Return if symptoms worsen or fail to improve.  Sharyle Fischer, DO

## 2023-06-24 DIAGNOSIS — J069 Acute upper respiratory infection, unspecified: Secondary | ICD-10-CM | POA: Diagnosis not present

## 2023-06-25 ENCOUNTER — Ambulatory Visit: Payer: BC Managed Care – PPO | Admitting: Urology

## 2023-06-25 ENCOUNTER — Encounter: Payer: Self-pay | Admitting: Urology

## 2023-06-25 VITALS — BP 139/87 | HR 69 | Ht 70.0 in | Wt 240.0 lb

## 2023-06-25 DIAGNOSIS — R351 Nocturia: Secondary | ICD-10-CM | POA: Diagnosis not present

## 2023-06-25 DIAGNOSIS — R350 Frequency of micturition: Secondary | ICD-10-CM

## 2023-06-25 DIAGNOSIS — R3912 Poor urinary stream: Secondary | ICD-10-CM

## 2023-06-25 DIAGNOSIS — N401 Enlarged prostate with lower urinary tract symptoms: Secondary | ICD-10-CM | POA: Diagnosis not present

## 2023-06-25 DIAGNOSIS — N138 Other obstructive and reflux uropathy: Secondary | ICD-10-CM

## 2023-06-25 LAB — MICROSCOPIC EXAMINATION

## 2023-06-25 LAB — URINALYSIS, COMPLETE
Bilirubin, UA: NEGATIVE
Glucose, UA: NEGATIVE
Ketones, UA: NEGATIVE
Leukocytes,UA: NEGATIVE
Nitrite, UA: NEGATIVE
Protein,UA: NEGATIVE
RBC, UA: NEGATIVE
Specific Gravity, UA: >1.03 — ABNORMAL HIGH (ref 1.005–1.030)
Urobilinogen, Ur: 0.2 mg/dL (ref 0.2–1.0)
pH, UA: 5.5 (ref 5.0–7.5)

## 2023-06-25 LAB — BLADDER SCAN AMB NON-IMAGING: PVR: 2 WU

## 2023-06-26 LAB — NOVEL CORONAVIRUS, NAA: SARS-CoV-2, NAA: NOT DETECTED

## 2023-06-26 LAB — SPECIMEN STATUS REPORT

## 2023-07-13 ENCOUNTER — Encounter: Payer: Self-pay | Admitting: Internal Medicine

## 2023-07-13 ENCOUNTER — Other Ambulatory Visit: Payer: Self-pay

## 2023-07-13 ENCOUNTER — Ambulatory Visit: Payer: BC Managed Care – PPO | Admitting: Internal Medicine

## 2023-07-13 ENCOUNTER — Telehealth: Payer: Self-pay | Admitting: Internal Medicine

## 2023-07-13 VITALS — BP 128/76 | HR 98 | Temp 98.0°F | Resp 16 | Ht 70.0 in | Wt 264.0 lb

## 2023-07-13 DIAGNOSIS — I10 Essential (primary) hypertension: Secondary | ICD-10-CM | POA: Diagnosis not present

## 2023-07-13 DIAGNOSIS — E782 Mixed hyperlipidemia: Secondary | ICD-10-CM | POA: Diagnosis not present

## 2023-07-13 DIAGNOSIS — E66812 Obesity, class 2: Secondary | ICD-10-CM | POA: Diagnosis not present

## 2023-07-13 DIAGNOSIS — R7303 Prediabetes: Secondary | ICD-10-CM | POA: Diagnosis not present

## 2023-07-13 DIAGNOSIS — Z6837 Body mass index (BMI) 37.0-37.9, adult: Secondary | ICD-10-CM

## 2023-07-13 LAB — POCT GLYCOSYLATED HEMOGLOBIN (HGB A1C): Hemoglobin A1C: 6 % — AB (ref 4.0–5.6)

## 2023-07-13 MED ORDER — WEGOVY 0.5 MG/0.5ML ~~LOC~~ SOAJ: 0.5000 mg | SUBCUTANEOUS | 2 refills | Status: DC

## 2023-07-13 MED ORDER — WEGOVY 0.25 MG/0.5ML ~~LOC~~ SOAJ: 0.5000 mg | SUBCUTANEOUS | 1 refills | Status: DC

## 2023-07-13 MED ORDER — ROSUVASTATIN CALCIUM 5 MG PO TABS: 5.0000 mg | ORAL_TABLET | Freq: Every day | ORAL | 1 refills | Status: DC

## 2023-07-13 NOTE — Assessment & Plan Note (Signed)
Now on Wegovy, tolerating 0.25 mg dose well, will increase to 0.5 mg and recheck in 3 months.

## 2023-07-13 NOTE — Telephone Encounter (Unsigned)
Copied from CRM [id number]. Topic: General - Other >> Jul 13, 2023  9:35 AM Franchot Heidelberg wrote: Reason for CRM: Pharmacist called requesting clarificaiton for the patient's wegovy, please advise   [id number] [place], [place] - [address] AT [facility] [address] [place], [place] [address] Phone: [phone]  Fax: [phone]

## 2023-07-13 NOTE — Assessment & Plan Note (Signed)
Doing well on statin, refill and plan to recheck cholesterol in the fall.

## 2023-07-13 NOTE — Telephone Encounter (Signed)
New script will be sent

## 2023-07-13 NOTE — Progress Notes (Signed)
Established Patient Office Visit  Subjective   Patient ID: Jeffrey Rivers, male    DOB: 1971/03/11  Age: 53 y.o. MRN: [id number]  Chief Complaint  Patient presents with   Medical Management of Chronic Issues    HPI  Jeffrey Rivers presents for follow up on chronic medical conditions.   Hypertension: -Medications: Lisinopril 10 mg, Amlodipine 10 mg -Patient is mostly compliant with above medications and reports no side effects generally. Sometimes forgets to take medications so he is taking them all at once at night, which sometime causes dizziness, fatigue. -Checking BP at home (average): not checking -Denies any SOB, CP, vision changes, LE edema  HLD: -Medications: Crestor 5 mg, new since LOV -Patient is compliant with medication and reports no side effects -Last lipid panel: Lipid Panel     Component Value Date/Time   CHOL 194 04/09/2023 1518   CHOL 217 (H) 11/05/2015 1518   TRIG 206 (H) 04/09/2023 1518   HDL 54 04/09/2023 1518   HDL 59 11/05/2015 1518   CHOLHDL 3.6 04/09/2023 1518   VLDL 11 02/02/2017 0838   LDLCALC 108 (H) 04/09/2023 1518   LABVLDL 12 11/05/2015 1518   The 10-year ASCVD risk score (Arnett DK, et al., 2019) is: 8.5%   Values used to calculate the score:     Age: 58 years     Sex: Male     Is Non-Hispanic African American: Yes     Diabetic: No     Tobacco smoker: No     Systolic Blood Pressure: 128 mmHg     Is BP treated: Yes     HDL Cholesterol: 54 mg/dL     Total Cholesterol: 194 mg/dL   Pre-Diabetes/Obesity: -Last A1c: 3/24 6.0% -Started on Wegovy 0.25 mg at LOV - just got prescription a few weeks ago and has done 2 injections so far -Denies side effects other than mild heart burn symptoms  BPH: -Currently on Flomax 0.4 mg BID, having more nocturia lately and increased urinary urgency  -Denies dysuria, hematuria  Low Testosterone/Fatigue: -Still complaining of fatigue, testosterone checked last June and was low at 172 -Patient has  urologist  Health Maintenance: -Blood work UTD -Colon cancer screening: colonoscopy 2/23, repeat in 5 years   Patient Active Problem List   Diagnosis Date Noted   Low testosterone in male 06/12/2020   History of snoring 06/12/2020   Prediabetes 05/21/2018   Class 2 severe obesity with serious comorbidity and body mass index (BMI) of 37.0 to 37.9 in adult [facility]) 03/22/2016   Hyperlipidemia 03/17/2016   Hesitancy 12/19/2015   Benign prostatic hyperplasia with urinary obstruction 11/29/2015   Essential hypertension, benign 11/05/2015   LVH (left ventricular hypertrophy) 11/05/2015   Past Medical History:  Diagnosis Date   Essential hypertension, benign 11/05/2015   Hypertension    Past Surgical History:  Procedure Laterality Date   APPENDECTOMY     COLONOSCOPY WITH PROPOFOL N/A 08/13/2021   Procedure: COLONOSCOPY WITH PROPOFOL;  Surgeon: Wyline Mood, MD;  Location[facility] ENDOSCOPY;  Service: Gastroenterology;  Laterality: N/A;   HERNIA REPAIR Right 06/05/2014   Inguinal   Social History   Tobacco Use   Smoking status: Never    Passive exposure: Never   Smokeless tobacco: Never  Vaping Use   Vaping status: Never Used  Substance Use Topics   Alcohol use: No   Drug use: No   Social History   Socioeconomic History   Marital status: Married    Spouse name: Not  on file   Number of children: Not on file   Years of education: Not on file   Highest education level: Not on file  Occupational History   Not on file  Tobacco Use   Smoking status: Never    Passive exposure: Never   Smokeless tobacco: Never  Vaping Use   Vaping status: Never Used  Substance and Sexual Activity   Alcohol use: No   Drug use: No   Sexual activity: Not Currently  Other Topics Concern   Not on file  Social History Narrative   Not on file   Social Drivers of Health   Financial Resource Strain: Low Risk  (08/09/2020)   Overall Financial Resource Strain (CARDIA)    Difficulty of Paying Living  Expenses: Not hard at all  Food Insecurity: No Food Insecurity (08/09/2020)   Hunger Vital Sign    Worried About Running Out of Food in the Last Year: Never true    Ran Out of Food in the Last Year: Never true  Transportation Needs: No Transportation Needs (08/09/2020)   PRAPARE - Administrator, Civil Service (Medical): No    Lack of Transportation (Non-Medical): No  Physical Activity: Insufficiently Active (08/09/2020)   Exercise Vital Sign    Days of Exercise per Week: 3 days    Minutes of Exercise per Session: 40 min  Stress: Not on file (04/23/2023)  Social Connections: Socially Integrated (08/09/2020)   Social Connection and Isolation Panel [NHANES]    Frequency of Communication with Friends and Family: More than three times a week    Frequency of Social Gatherings with Friends and Family: More than three times a week    Attends Religious Services: More than 4 times per year    Active Member of [employer] or Organizations: No    Attends Engineer, structural: More than 4 times per year    Marital Status: Married  Catering manager Violence: Not At Risk (08/09/2020)   Humiliation, Afraid, Rape, and Kick questionnaire    Fear of Current or Ex-Partner: No    Emotionally Abused: No    Physically Abused: No    Sexually Abused: No   Family Status  Relation Name Status   Mother  Alive   Father  Deceased   Neg Hx  (Not Specified)  No partnership data on file   Family History  Problem Relation Age of Onset   Cancer Father 8       colon   Prostate cancer Neg Hx    Kidney cancer Neg Hx    No Known Allergies    Review of Systems  Constitutional:  Negative for chills and fever.  Eyes:  Negative for blurred vision.  Respiratory:  Negative for shortness of breath.   Cardiovascular:  Negative for chest pain.  Gastrointestinal:  Positive for heartburn. Negative for constipation, nausea and vomiting.  Neurological:  Positive for dizziness.      Objective:     BP  128/76 (Cuff Size: Large)   Pulse 98   Temp 98 F (36.7 C) (Oral)   Resp 16   Ht 5\' 10"  (1.778 m)   Wt 264 lb (119.7 kg)   SpO2 95%   BMI 37.88 kg/m  BP Readings from Last 3 Encounters:  07/13/23 128/76  06/25/23 139/87  06/23/23 130/88   Wt Readings from Last 3 Encounters:  07/13/23 264 lb (119.7 kg)  06/25/23 240 lb (108.9 kg)  06/23/23 266 lb 12.8 oz (121 kg)  Physical Exam Constitutional:      Appearance: Normal appearance.  HENT:     Head: Normocephalic and atraumatic.  Eyes:     Conjunctiva/sclera: Conjunctivae normal.  Cardiovascular:     Rate and Rhythm: Normal rate and regular rhythm.  Pulmonary:     Effort: Pulmonary effort is normal.     Breath sounds: Normal breath sounds.  Skin:    General: Skin is warm and dry.  Neurological:     General: No focal deficit present.     Mental Status: He is alert. Mental status is at baseline.  Psychiatric:        Mood and Affect: Mood normal.      Results for orders placed or performed in visit on 07/13/23  POCT HgB A1C  Result Value Ref Range   Hemoglobin A1C 6.0 (A) 4.0 - 5.6 %   HbA1c POC (<> result, manual entry)     HbA1c, POC (prediabetic range)     HbA1c, POC (controlled diabetic range)      Last CBC Lab Results  Component Value Date   WBC 4.3 04/09/2023   HGB 14.3 04/09/2023   HCT 43.8 04/09/2023   MCV 82.6 04/09/2023   MCH 27.0 04/09/2023   RDW 13.0 04/09/2023   PLT 227 04/09/2023   Last metabolic panel Lab Results  Component Value Date   GLUCOSE 97 04/09/2023   NA 138 04/09/2023   K 4.2 04/09/2023   CL 103 04/09/2023   CO2 26 04/09/2023   BUN 15 04/09/2023   CREATININE 1.04 04/09/2023   EGFR 87 04/09/2023   CALCIUM 9.5 04/09/2023   PROT 6.7 04/09/2023   ALBUMIN 4.3 02/02/2017   BILITOT 0.3 04/09/2023   ALKPHOS 43 02/02/2017   AST 21 04/09/2023   ALT 27 04/09/2023   ANIONGAP 6 08/23/2015   Last lipids Lab Results  Component Value Date   CHOL 194 04/09/2023   HDL 54  04/09/2023   LDLCALC 108 (H) 04/09/2023   TRIG 206 (H) 04/09/2023   CHOLHDL 3.6 04/09/2023   Last hemoglobin A1c Lab Results  Component Value Date   HGBA1C 6.0 (A) 07/13/2023   Last thyroid functions Lab Results  Component Value Date   TSH 1.45 12/12/2021   Last vitamin D Lab Results  Component Value Date   VD25OH 34 12/12/2021   Last vitamin B12 and Folate Lab Results  Component Value Date   VITAMINB12 710 02/25/2019      The 10-year ASCVD risk score (Arnett DK, et al., 2019) is: 8.5%    Assessment & Plan:  Essential hypertension, benign Assessment & Plan: Blood pressure stable here today, plan to start taking Lisinopril in the mornings and Amlodipine at night, I think this will improve dizziness.     Mixed hyperlipidemia Assessment & Plan: Doing well on statin, refill and plan to recheck cholesterol in the fall.   Orders: -     Rosuvastatin Calcium; Take 1 tablet (5 mg total) by mouth daily.  Dispense: 90 tablet; Refill: 1  Prediabetes Assessment & Plan: A1c improved to 6.0%, increasing Wegovy dose. He is also starting to exercise consistently.   Orders: -     Wegovy; Inject 0.5 mg into the skin once a week.  Dispense: 6 mL; Refill: 1 -     POCT glycosylated hemoglobin (Hb A1C)  Class 2 severe obesity with serious comorbidity and body mass index (BMI) of 37.0 to 37.9 in adult, unspecified obesity type [employer] [facility]) Assessment & Plan: Now on Wegovy, tolerating  0.25 mg dose well, will increase to 0.5 mg and recheck in 3 months.   Orders: -     Wegovy; Inject 0.5 mg into the skin once a week.  Dispense: 6 mL; Refill: 1     Return in about 3 months (around 10/11/2023).    Margarita Mail, DO

## 2023-07-13 NOTE — Assessment & Plan Note (Signed)
Blood pressure stable here today, plan to start taking Lisinopril in the mornings and Amlodipine at night, I think this will improve dizziness.

## 2023-07-13 NOTE — Addendum Note (Signed)
Addended by: Margarita Mail on: 07/13/2023 10:27 AM   Modules accepted: Orders

## 2023-07-13 NOTE — Assessment & Plan Note (Signed)
A1c improved to 6.0%, increasing Wegovy dose. He is also starting to exercise consistently.

## 2023-07-24 ENCOUNTER — Encounter: Payer: BC Managed Care – PPO | Admitting: Urology

## 2023-07-31 ENCOUNTER — Ambulatory Visit: Payer: Self-pay | Admitting: *Deleted

## 2023-07-31 NOTE — Telephone Encounter (Signed)
Please call and see if he need appointment

## 2023-07-31 NOTE — Telephone Encounter (Signed)
  Chief Complaint: Georga Hacking, RN with Access Nurse who answers over night calls for the [facility] called in to see if there were any appts available for this pt.   She has a standing order to call in Tamiflu for him but since he is c/o his ears hurting real bad she thought he ought to be seen.   Triage not repeated by [facility] nurse since he called in at 7:12 AM this morning and Georga Hacking is going to follow their protocol for the Tamiflu.   Symptoms: Ears hurting real bad, congestion, did have fever but not now.    Cary, RN believes he has the flu so is calling in Tamiflu for him per her standing order protocol. Frequency: now Pertinent Negatives: Patient denies  Disposition: [] ED /[] Urgent Care (no appt availability in office) / [] Appointment(In office/virtual)/ []  [place] [facility]/ [] Home Care/ [] Refused Recommended Disposition /[] [place] [facility]/ [x]  Follow-up with PCP Additional Notes: I'm sending a message to Dr. Caralee Ates as an Lorain Childes.

## 2023-07-31 NOTE — Telephone Encounter (Signed)
Spoke with pt and he does not need an appt. He did a virtual visit this morning

## 2023-07-31 NOTE — Telephone Encounter (Signed)
     Answer Assessment - Initial Assessment Questions 1. LOCATION: "Which ear is involved?"     Cary, RN with Access Nurse who answers calls for the Patient Engagement Center overnight called in.   Pt called in at 7:12 AM this morning c/o his ears hurting real bad, congestion and did have fever but not now.   There was a note on his chart that he had called in.   Georga Hacking was seeing if we had any appts available.   There were no appts available with any of the providers at either [facility] or [facility] as an Office Visit, Same Day Visit or Virtual Visit.   She has a standing order where she can call in Tamiflu for the pt.   Per her standing orders she is going to call in the Tamiflu for the pt and follow their protocols  He does not use MyChart when [place] checked with him.  We were going to offer him that alternative.   I did not speak with pt since McGregor, RN has a standing order to call in the Tamiflu and instruct him to call back in if he does not improve.  2. ONSET: "When did the ear start hurting"      Triage done by Georga Hacking, RN with Access Nurse, overnight call service for [facility]. 3. SEVERITY: "How bad is the pain?"  (Scale 1-10; mild, moderate or severe)   - MILD (1-3): doesn't interfere with normal activities    - MODERATE (4-7): interferes with normal activities or awakens from sleep    - SEVERE (8-10): excruciating pain, unable to do any normal activities       4. URI SYMPTOMS: "Do you have a runny nose or cough?"      5. FEVER: "Do you have a fever?" If Yes, ask: "What is your temperature, how was it measured, and when did it start?"      6. CAUSE: "Have you been swimming recently?", "How often do you use Q-TIPS?", "Have you had any recent air travel or scuba diving?"      7. OTHER SYMPTOMS: "Do you have any other symptoms?" (e.g., headache, stiff neck, dizziness, vomiting, runny nose, decreased hearing)      8. PREGNANCY: "Is there any chance you are pregnant?" "When  was your last menstrual period?"  Protocols used: Davina Poke

## 2023-08-15 DIAGNOSIS — N138 Other obstructive and reflux uropathy: Secondary | ICD-10-CM

## 2023-08-15 HISTORY — DX: Other obstructive and reflux uropathy: N13.8

## 2023-08-19 ENCOUNTER — Ambulatory Visit (INDEPENDENT_AMBULATORY_CARE_PROVIDER_SITE_OTHER): Payer: BC Managed Care – PPO | Admitting: Urology

## 2023-08-19 ENCOUNTER — Encounter: Payer: Self-pay | Admitting: Urology

## 2023-08-19 VITALS — BP 114/79 | HR 88 | Ht 70.0 in | Wt 250.0 lb

## 2023-08-19 DIAGNOSIS — N138 Other obstructive and reflux uropathy: Secondary | ICD-10-CM

## 2023-08-19 DIAGNOSIS — N401 Enlarged prostate with lower urinary tract symptoms: Secondary | ICD-10-CM

## 2023-08-19 DIAGNOSIS — R972 Elevated prostate specific antigen [PSA]: Secondary | ICD-10-CM

## 2023-08-19 DIAGNOSIS — Z125 Encounter for screening for malignant neoplasm of prostate: Secondary | ICD-10-CM

## 2023-08-19 DIAGNOSIS — R350 Frequency of micturition: Secondary | ICD-10-CM

## 2023-08-20 LAB — MICROSCOPIC EXAMINATION

## 2023-08-20 LAB — URINALYSIS, COMPLETE
Bilirubin, UA: NEGATIVE
Glucose, UA: NEGATIVE
Ketones, UA: NEGATIVE
Leukocytes,UA: NEGATIVE
Nitrite, UA: NEGATIVE
Protein,UA: NEGATIVE
RBC, UA: NEGATIVE
Specific Gravity, UA: >1.03 — ABNORMAL HIGH (ref 1.005–1.030)
Urobilinogen, Ur: 0.2 mg/dL (ref 0.2–1.0)
pH, UA: 6 (ref 5.0–7.5)

## 2023-08-20 LAB — PSA: Prostate Specific Ag, Serum: 0.6 ng/mL (ref 0.0–4.0)

## 2023-08-21 ENCOUNTER — Encounter: Payer: Self-pay | Admitting: Urology

## 2023-08-21 ENCOUNTER — Telehealth: Payer: Self-pay

## 2023-08-21 ENCOUNTER — Other Ambulatory Visit: Payer: Self-pay

## 2023-08-21 DIAGNOSIS — N401 Enlarged prostate with lower urinary tract symptoms: Secondary | ICD-10-CM

## 2023-08-21 DIAGNOSIS — N138 Other obstructive and reflux uropathy: Secondary | ICD-10-CM

## 2023-08-21 NOTE — Progress Notes (Signed)
 Surgical Physician Order Form [facility] [place]  * Scheduling expectation : Next Available- [place]  *Length of Case: 90 minutes  *Clearance needed: no  *Anticoagulation Instructions: N/A  *Aspirin Instructions: N/A  *Post-op visit Date/Instructions: Cath removal postop day 3  *Diagnosis: BPH w/urinary obstruction  *Procedure:  HOLEP (16109)   Additional orders: N/A  -Admit type: OUTpatient  -Anesthesia: General  -VTE Prophylaxis Standing Order SCD's       Other: On Wegovy  -Standing Lab Orders Per Anesthesia    Lab other: UA&Urine Culture  -Standing Test orders EKG/Chest x-ray per Anesthesia       Test other:   - Medications:  Ancef 2gm IV  -Other orders:  N/A

## 2023-08-21 NOTE — Progress Notes (Signed)
   08/21/23  Chief Complaint  Patient presents with   Cysto    Trus    HPI: 53 y.o. year-old male with a long history of urinary symptoms related to BPH and is interested in surgical management.  He presents today to the office for cystoscopy and prostate sizing.  He states he has a friend who had similar symptoms and underwent HoLEP with marked improvement and is specifically interested in this procedure   Please see previous notes for details.     Blood pressure 114/79, pulse 88, height 5\' 10"  (1.778 m), weight 250 lb (113.4 kg).    Cystoscopy Procedure Note  Patient identification was confirmed, informed consent was obtained, and patient was prepped using Betadine solution.  Lidocaine jelly was administered per urethral meatus.    Preoperative abx where received prior to procedure.     Pre-Procedure: - Inspection reveals a normal caliber urethral meatus.  Procedure: The flexible cystoscope was introduced without difficulty - No urethral strictures/lesions are present. -Moderate lateral lobe enlargement prostate  -Median lobe @ bladder neck - Bilateral ureteral orifices identified - Bladder mucosa  reveals no ulcers, tumors, or lesions - No bladder stones -Moderate trabeculation  Retroflexion shows prominent intravesical median lobe   Post-Procedure: - Patient tolerated the procedure well   Prostate transrectal ultrasound sizing   Informed consent was obtained after discussing risks/benefits of the procedure.  A time out was performed to ensure correct patient identity.   Pre-Procedure: -Transrectal probe was placed without difficulty -Transrectal Ultrasound performed revealing a 66 gm prostate (bullet volume) measuring 3.9 x 4.5 x 5.7 cm (length) -Median lobe noted      Assessment/ Plan: Lower urinary tract symptoms with trilobar enlargement He is a candidate for HoLEP and desires to schedule Will schedule with Dr. Apolinar Junes or Dr. Richardo Hanks and he will see  preoperatively to discuss the procedure in detail    Riki Altes, MD

## 2023-08-21 NOTE — Addendum Note (Signed)
 Addended by: Letta Kocher A on: 08/21/2023 04:27 PM   Modules accepted: Orders

## 2023-08-21 NOTE — Telephone Encounter (Signed)
  Per Dr. Richardo Hanks, Patient is to be scheduled for Holmium Laser Enucleation of the Prostate   Jeffrey Rivers was contacted and possible surgical dates were discussed, Friday April 11th, 2025 was agreed upon for surgery.   Patient was directed to call [phone] between 1-3pm the day before surgery to find out surgical arrival time.  Instructions were given not to eat or drink from midnight on the night before surgery and have a driver for the day of surgery. On the surgery day patient was instructed to enter through the Medical Mall entrance of [facility] report the Same Day Surgery desk.   Pre-Admit Testing will be in contact via phone to set up an interview with the anesthesia team to review your history and medications prior to surgery.   Reminder of this information was sent via MyChart to the patient.

## 2023-08-21 NOTE — Progress Notes (Signed)
   [facility] Surgical Posting Form  Surgery Date: Date: 09/25/2023  Surgeon: Dr. Legrand Rams, MD  Inpt ( No  )   Outpt (Yes)   Obs ( No  )   Diagnosis: N40.1, N13.8 Benign Prostatic Hyperplasia with Urinary Obstruction  -CPT: 78295  Surgery: Holmium Laser Enucleation of the Prostate  Stop Anticoagulations: N/A  Cardiac/Medical/Pulmonary Clearance needed: No  *Orders entered into EPIC  Date: 08/21/23   *Case booked in EPIC  Date: 08/21/23  *Notified pt of Surgery: Date: 08/21/23  PRE-OP UA & CX: yes, will obtain at [facility] on 09/08/2023  *Placed into Prior Authorization Work Angela Nevin Date: 08/21/23  Assistant/laser/rep:No

## 2023-09-08 ENCOUNTER — Encounter: Payer: Self-pay | Admitting: Urology

## 2023-09-08 ENCOUNTER — Ambulatory Visit: Admitting: Urology

## 2023-09-08 ENCOUNTER — Other Ambulatory Visit
Admission: RE | Admit: 2023-09-08 | Discharge: 2023-09-08 | Disposition: A | Discharge status: Home / Self Care | Attending: Urology | Admitting: Urology

## 2023-09-08 VITALS — BP 145/87 | HR 80 | Ht 70.0 in | Wt 263.0 lb

## 2023-09-08 DIAGNOSIS — N138 Other obstructive and reflux uropathy: Secondary | ICD-10-CM | POA: Insufficient documentation

## 2023-09-08 DIAGNOSIS — N401 Enlarged prostate with lower urinary tract symptoms: Secondary | ICD-10-CM | POA: Insufficient documentation

## 2023-09-08 LAB — URINALYSIS, COMPLETE (UACMP) WITH MICROSCOPIC
Bilirubin Urine: NEGATIVE
Glucose, UA: NEGATIVE mg/dL
Hgb urine dipstick: NEGATIVE
Ketones, ur: NEGATIVE mg/dL
Leukocytes,Ua: NEGATIVE
Nitrite: NEGATIVE
Protein, ur: NEGATIVE mg/dL
Specific Gravity, Urine: 1.025 (ref 1.005–1.030)
pH: 5 (ref 5.0–8.0)

## 2023-09-08 LAB — BLADDER SCAN AMB NON-IMAGING

## 2023-09-08 NOTE — Patient Instructions (Signed)

## 2023-09-08 NOTE — Progress Notes (Signed)
   09/08/2023 3:37 PM   Bud Face Grade February 14, 1971 [id number]  Reason for visit: BPH, discuss HOLEP  HPI: 53 year old male has been followed by Dr. Celso Amy, Michiel Cowboy, and Dr. Lonna Cobb for BPH and obstructive symptoms.  He has had refractory symptoms despite Flomax, and IPSS score 23 with quality-of-life unhappy.  PVR has been normal.  Primary issues are nocturia, weak urinary stream, straining, and frequency.  He underwent further evaluation with Dr. Lonna Cobb on 08/19/2023 with cystoscopy showing obstructive appearing prostate and median lobe, prostate measured 66 g on TRUS.  He apparently has a friend who underwent HOLEP and has done very well and he is interested in [facility] for definitive management.    We discussed the risks and benefits of HoLEP at length.  The procedure requires general anesthesia and takes 1 to 2 hours, and a holmium laser is used to enucleate the prostate and push this tissue into the bladder.  A morcellator is then used to remove this tissue, which is sent for pathology.  The vast majority(>95%) of patients are able to discharge the same day with a catheter in place for 2 to 3 days, and will follow-up in clinic for a voiding trial.  We specifically discussed the risks of bleeding, infection, retrograde ejaculation, temporary urgency and urge incontinence, very low risk of long-term incontinence, urethral stricture/bladder neck contracture, pathologic evaluation of prostate tissue and possible detection of prostate cancer or other malignancy, and possible need for additional procedures.  Schedule HOLEP for BPH Pre-op urine culture sent today  Sondra Come, MD  [facility] [address] [place], [place] [address] [phone]

## 2023-09-09 LAB — URINE CULTURE: Culture: NO GROWTH

## 2023-09-15 ENCOUNTER — Inpatient Hospital Stay: Admission: RE | Admit: 2023-09-15 | Source: Ambulatory Visit

## 2023-09-15 HISTORY — DX: Hyperlipidemia, unspecified: E78.5

## 2023-09-15 HISTORY — DX: Prediabetes: R73.03

## 2023-09-16 ENCOUNTER — Other Ambulatory Visit: Payer: Self-pay

## 2023-09-16 ENCOUNTER — Encounter
Admission: RE | Admit: 2023-09-16 | Discharge: 2023-09-16 | Disposition: A | Discharge status: Home / Self Care | Source: Ambulatory Visit | Attending: Urology | Admitting: Urology

## 2023-09-16 VITALS — Ht 70.0 in | Wt 260.0 lb

## 2023-09-16 DIAGNOSIS — I10 Essential (primary) hypertension: Secondary | ICD-10-CM

## 2023-09-16 DIAGNOSIS — Z01812 Encounter for preprocedural laboratory examination: Secondary | ICD-10-CM

## 2023-09-16 DIAGNOSIS — R7303 Prediabetes: Secondary | ICD-10-CM

## 2023-09-16 DIAGNOSIS — Z01810 Encounter for preprocedural cardiovascular examination: Secondary | ICD-10-CM

## 2023-09-16 HISTORY — DX: Morbid (severe) obesity due to excess calories: E66.01

## 2023-09-16 HISTORY — DX: Obesity, class 2: E66.812

## 2023-09-16 NOTE — Patient Instructions (Addendum)
 Your procedure is scheduled on: Friday, April 11 Report to the Registration Desk on the 1st floor of the [facility] To find out your arrival time, please call [phone] between 1PM - 3PM on: Thursday, April 10 If your arrival time is 6:00 am, do not arrive before that time as the Medical Mall entrance doors do not open until 6:00 am.  REMEMBER: Instructions that are not followed completely may result in serious medical risk, up to and including death; or upon the discretion of your surgeon and anesthesiologist your surgery may need to be rescheduled.  Do not eat or drink after midnight the night before surgery.  No gum chewing or hard candies.  One week prior to surgery: starting April 4 Stop Anti-inflammatories (NSAIDS) such as Advil, Aleve, Ibuprofen, Motrin, Naproxen, Naprosyn and Aspirin based products such as Excedrin, Goody's Powder, BC Powder. Stop ANY OVER THE COUNTER supplements until after surgery.   You may however, continue to take Tylenol if needed for pain up until the day of surgery.  HYQMVH - hold for 7 days before surgery. Do NOT take between Friday, April 4 - Friday, April 11. Resume AFTER surgery and choose a regular weekly day to ensure appropriate use of this medication.  Continue taking all of your other prescription medications up until the day of surgery.  ON THE DAY OF SURGERY ONLY TAKE THESE MEDICATIONS WITH SIPS OF WATER:  Amlodipine Rosuvastatin (Crestor) Tamsulosin (Flomax)  No Alcohol for 24 hours before or after surgery.  No Smoking including e-cigarettes for 24 hours before surgery.  No chewable tobacco products for at least 6 hours before surgery.  No nicotine patches on the day of surgery.  Do not use any "recreational" drugs for at least a week (preferably 2 weeks) before your surgery.  Please be advised that the combination of cocaine and anesthesia may have negative outcomes, up to and including death. If you test positive for cocaine,  your surgery will be cancelled.  On the morning of surgery brush your teeth with toothpaste and water, you may rinse your mouth with mouthwash if you wish. Do not swallow any toothpaste or mouthwash.  Do not wear jewelry, make-up, hairpins, clips or nail polish.  For welded (permanent) jewelry: bracelets, anklets, waist bands, etc.  Please have this removed prior to surgery.  If it is not removed, there is a chance that hospital personnel will need to cut it off on the day of surgery.  Do not wear lotions, powders, or perfumes.   Do not shave body hair from the neck down 48 hours before surgery.  Contact lenses, hearing aids and dentures may not be worn into surgery.  Do not bring valuables to the hospital. [facility] is not responsible for any missing/lost belongings or valuables.   Notify your doctor if there is any change in your medical condition (cold, fever, infection).  Wear comfortable clothing (specific to your surgery type) to the hospital.  After surgery, you can help prevent lung complications by doing breathing exercises.  Take deep breaths and cough every 1-2 hours.   If you are being discharged the day of surgery, you will not be allowed to drive home. You will need a responsible individual to drive you home and stay with you for 24 hours after surgery.   If you are taking public transportation, you will need to have a responsible individual with you.  Please call the Pre-admissions Testing Dept. at [phone] if you have any questions about  these instructions.  Surgery Visitation Policy:  Patients having surgery or a procedure may have two visitors.  Children under the age of 37 must have an adult with them who is not the patient.

## 2023-09-18 ENCOUNTER — Encounter
Admission: RE | Admit: 2023-09-18 | Discharge: 2023-09-18 | Disposition: A | Discharge status: Home / Self Care | Source: Ambulatory Visit | Attending: Urology | Admitting: Urology

## 2023-09-18 ENCOUNTER — Other Ambulatory Visit: Payer: Self-pay | Admitting: Urology

## 2023-09-18 DIAGNOSIS — I10 Essential (primary) hypertension: Secondary | ICD-10-CM | POA: Diagnosis not present

## 2023-09-18 DIAGNOSIS — Z01818 Encounter for other preprocedural examination: Secondary | ICD-10-CM | POA: Insufficient documentation

## 2023-09-18 DIAGNOSIS — Z01812 Encounter for preprocedural laboratory examination: Secondary | ICD-10-CM | POA: Diagnosis not present

## 2023-09-18 DIAGNOSIS — R9431 Abnormal electrocardiogram [ECG] [EKG]: Secondary | ICD-10-CM | POA: Diagnosis not present

## 2023-09-18 DIAGNOSIS — Z01810 Encounter for preprocedural cardiovascular examination: Secondary | ICD-10-CM

## 2023-09-18 DIAGNOSIS — N138 Other obstructive and reflux uropathy: Secondary | ICD-10-CM

## 2023-09-18 DIAGNOSIS — R351 Nocturia: Secondary | ICD-10-CM

## 2023-09-18 LAB — BASIC METABOLIC PANEL WITH GFR
Anion gap: 8 (ref 5–15)
BUN: 18 mg/dL (ref 6–20)
CO2: 27 mmol/L (ref 22–32)
Calcium: 9.3 mg/dL (ref 8.9–10.3)
Chloride: 103 mmol/L (ref 98–111)
Creatinine, Ser: 0.95 mg/dL (ref 0.61–1.24)
GFR, Estimated: >60 mL/min (ref >60)
Glucose, Bld: 102 mg/dL — ABNORMAL HIGH (ref 70–99)
Potassium: 3.7 mmol/L (ref 3.5–5.1)
Sodium: 138 mmol/L (ref 135–145)

## 2023-09-18 LAB — CBC
HCT: 42 % (ref 39.0–52.0)
Hemoglobin: 14.1 g/dL (ref 13.0–17.0)
MCH: 27.4 pg (ref 26.0–34.0)
MCHC: 33.6 g/dL (ref 30.0–36.0)
MCV: 81.7 fL (ref 80.0–100.0)
Platelets: 204 10*3/uL (ref 150–400)
RBC: 5.14 MIL/uL (ref 4.22–5.81)
RDW: 13.3 % (ref 11.5–15.5)
WBC: 4 10*3/uL (ref 4.0–10.5)
nRBC: 0 % (ref 0.0–0.2)

## 2023-09-24 MED ORDER — ORAL CARE MOUTH RINSE: 15.0000 mL | Freq: Once | OROMUCOSAL | Status: AC

## 2023-09-24 MED ORDER — LACTATED RINGERS IV SOLN: INTRAVENOUS | Status: DC

## 2023-09-24 MED ORDER — CHLORHEXIDINE GLUCONATE 0.12 % MT SOLN
15.0000 mL | Freq: Once | OROMUCOSAL | Status: AC
  Administered 2023-09-25: 15 mL via OROMUCOSAL

## 2023-09-24 MED ORDER — CEFAZOLIN SODIUM-DEXTROSE 2-4 GM/100ML-% IV SOLN
2.0000 g | INTRAVENOUS | Status: AC
  Administered 2023-09-25: 2 g via INTRAVENOUS

## 2023-09-25 ENCOUNTER — Ambulatory Visit
Admission: RE | Admit: 2023-09-25 | Discharge: 2023-09-25 | Disposition: A | Discharge status: Home / Self Care | Attending: Urology | Admitting: Urology

## 2023-09-25 ENCOUNTER — Ambulatory Visit: Payer: Self-pay | Admitting: Urgent Care

## 2023-09-25 ENCOUNTER — Other Ambulatory Visit: Payer: Self-pay

## 2023-09-25 ENCOUNTER — Encounter: Payer: Self-pay | Admitting: Urology

## 2023-09-25 ENCOUNTER — Ambulatory Visit: Admitting: Anesthesiology

## 2023-09-25 ENCOUNTER — Encounter
Admission: RE | Disposition: A | Discharge status: Home / Self Care | Payer: Self-pay | Source: Home / Self Care | Attending: Urology

## 2023-09-25 DIAGNOSIS — N401 Enlarged prostate with lower urinary tract symptoms: Secondary | ICD-10-CM | POA: Insufficient documentation

## 2023-09-25 DIAGNOSIS — N138 Other obstructive and reflux uropathy: Secondary | ICD-10-CM | POA: Insufficient documentation

## 2023-09-25 DIAGNOSIS — Z01812 Encounter for preprocedural laboratory examination: Secondary | ICD-10-CM

## 2023-09-25 DIAGNOSIS — R7303 Prediabetes: Secondary | ICD-10-CM

## 2023-09-25 HISTORY — PX: HOLEP-LASER ENUCLEATION OF THE PROSTATE WITH MORCELLATION: SHX6641

## 2023-09-25 SURGERY — ENUCLEATION, PROSTATE, USING LASER, WITH MORCELLATION
Anesthesia: General | Site: Prostate

## 2023-09-25 MED ORDER — CHLORHEXIDINE GLUCONATE 0.12 % MT SOLN
OROMUCOSAL | Status: AC
  Filled 2023-09-25: qty 15

## 2023-09-25 MED ORDER — TRAMADOL HCL 50 MG PO TABS: 25.0000 mg | ORAL_TABLET | Freq: Four times a day (QID) | ORAL | 0 refills | Status: AC | PRN

## 2023-09-25 MED ORDER — LIDOCAINE HCL (CARDIAC) PF 100 MG/5ML IV SOSY
PREFILLED_SYRINGE | INTRAVENOUS | Status: DC | PRN
  Administered 2023-09-25: 100 mg via INTRAVENOUS

## 2023-09-25 MED ORDER — PROPOFOL 10 MG/ML IV BOLUS
INTRAVENOUS | Status: AC
  Filled 2023-09-25: qty 20

## 2023-09-25 MED ORDER — ALBUTEROL SULFATE HFA 108 (90 BASE) MCG/ACT IN AERS
INHALATION_SPRAY | RESPIRATORY_TRACT | Status: AC
  Filled 2023-09-25: qty 6.7

## 2023-09-25 MED ORDER — BELLADONNA ALKALOIDS-OPIUM 16.2-60 MG RE SUPP
RECTAL | Status: AC
  Filled 2023-09-25: qty 1

## 2023-09-25 MED ORDER — DROPERIDOL 2.5 MG/ML IJ SOLN: 0.6250 mg | Freq: Once | INTRAMUSCULAR | Status: DC | PRN

## 2023-09-25 MED ORDER — DEXAMETHASONE SODIUM PHOSPHATE 10 MG/ML IJ SOLN
INTRAMUSCULAR | Status: DC | PRN
  Administered 2023-09-25: 4 mg via INTRAVENOUS

## 2023-09-25 MED ORDER — DEXAMETHASONE SODIUM PHOSPHATE 10 MG/ML IJ SOLN
INTRAMUSCULAR | Status: AC
  Filled 2023-09-25: qty 1

## 2023-09-25 MED ORDER — OXYCODONE HCL 5 MG/5ML PO SOLN: 5.0000 mg | Freq: Once | ORAL | Status: DC | PRN

## 2023-09-25 MED ORDER — LIDOCAINE HCL (PF) 2 % IJ SOLN
INTRAMUSCULAR | Status: AC
  Filled 2023-09-25: qty 5

## 2023-09-25 MED ORDER — SODIUM CHLORIDE 0.9 % IR SOLN
Status: DC | PRN
  Administered 2023-09-25: 15000 mL
  Administered 2023-09-25 (×2): 9000 mL

## 2023-09-25 MED ORDER — ONDANSETRON HCL 4 MG/2ML IJ SOLN
INTRAMUSCULAR | Status: AC
  Filled 2023-09-25: qty 2

## 2023-09-25 MED ORDER — MIDAZOLAM HCL 2 MG/2ML IJ SOLN
INTRAMUSCULAR | Status: DC | PRN
  Administered 2023-09-25: 2 mg via INTRAVENOUS

## 2023-09-25 MED ORDER — GLYCOPYRROLATE 0.2 MG/ML IJ SOLN
INTRAMUSCULAR | Status: DC | PRN
  Administered 2023-09-25: .2 mg via INTRAVENOUS

## 2023-09-25 MED ORDER — PROPOFOL 10 MG/ML IV BOLUS
INTRAVENOUS | Status: DC | PRN
  Administered 2023-09-25: 150 mg via INTRAVENOUS

## 2023-09-25 MED ORDER — GLYCOPYRROLATE 0.2 MG/ML IJ SOLN
INTRAMUSCULAR | Status: AC
  Filled 2023-09-25: qty 1

## 2023-09-25 MED ORDER — ACETAMINOPHEN 10 MG/ML IV SOLN
1000.0000 mg | Freq: Once | INTRAVENOUS | Status: DC | PRN
  Administered 2023-09-25: 1000 mg via INTRAVENOUS

## 2023-09-25 MED ORDER — CEFAZOLIN SODIUM-DEXTROSE 2-4 GM/100ML-% IV SOLN
INTRAVENOUS | Status: AC
  Filled 2023-09-25: qty 100

## 2023-09-25 MED ORDER — OXYCODONE HCL 5 MG PO TABS: 5.0000 mg | ORAL_TABLET | Freq: Once | ORAL | Status: DC | PRN

## 2023-09-25 MED ORDER — ROCURONIUM BROMIDE 100 MG/10ML IV SOLN
INTRAVENOUS | Status: DC | PRN
  Administered 2023-09-25: 40 mg via INTRAVENOUS

## 2023-09-25 MED ORDER — FENTANYL CITRATE (PF) 100 MCG/2ML IJ SOLN
INTRAMUSCULAR | Status: AC
  Filled 2023-09-25: qty 2

## 2023-09-25 MED ORDER — FENTANYL CITRATE (PF) 100 MCG/2ML IJ SOLN
INTRAMUSCULAR | Status: DC | PRN
  Administered 2023-09-25: 50 ug via INTRAVENOUS

## 2023-09-25 MED ORDER — MIDAZOLAM HCL 2 MG/2ML IJ SOLN
INTRAMUSCULAR | Status: AC
  Filled 2023-09-25: qty 2

## 2023-09-25 MED ORDER — ONDANSETRON HCL 4 MG/2ML IJ SOLN
INTRAMUSCULAR | Status: DC | PRN
  Administered 2023-09-25: 4 mg via INTRAVENOUS

## 2023-09-25 MED ORDER — SUGAMMADEX SODIUM 200 MG/2ML IV SOLN
INTRAVENOUS | Status: DC | PRN
  Administered 2023-09-25: 200 mg via INTRAVENOUS

## 2023-09-25 MED ORDER — FENTANYL CITRATE (PF) 100 MCG/2ML IJ SOLN: 25.0000 ug | INTRAMUSCULAR | Status: DC | PRN

## 2023-09-25 MED ORDER — ALBUTEROL SULFATE HFA 108 (90 BASE) MCG/ACT IN AERS
INHALATION_SPRAY | RESPIRATORY_TRACT | Status: DC | PRN
  Administered 2023-09-25: 5 via RESPIRATORY_TRACT

## 2023-09-25 MED ORDER — ACETAMINOPHEN 10 MG/ML IV SOLN
INTRAVENOUS | Status: AC
  Filled 2023-09-25: qty 100

## 2023-09-25 SURGICAL SUPPLY — 30 items
ADAPTER IRRIG TUBE 2 SPIKE SOL (ADAPTER) ×2 IMPLANT
BAG URO DRAIN 4000ML (MISCELLANEOUS) ×1 IMPLANT
CATH URETL OPEN END 4X70 (CATHETERS) ×1 IMPLANT
CATH URTH STD 24FR FL 3W 2 (CATHETERS) ×1 IMPLANT
CONTAINER COLLECT MORCELLATR (MISCELLANEOUS) ×1 IMPLANT
DRAPE UTILITY 15X26 TOWEL STRL (DRAPES) IMPLANT
ELECT BIVAP BIPO 22/24 DONUT (ELECTROSURGICAL) ×1 IMPLANT
ELECTRD BIVAP BIPO 22/24 DONUT (ELECTROSURGICAL) IMPLANT
FIBER LASER MOSES 550 DFL (Laser) ×1 IMPLANT
FILTER OVERFLOW MORCELLATOR (FILTER) ×1 IMPLANT
GLOVE BIOGEL PI IND STRL 7.5 (GLOVE) ×1 IMPLANT
GOWN STRL REUS W/ TWL LRG LVL3 (GOWN DISPOSABLE) ×1 IMPLANT
GOWN STRL REUS W/ TWL XL LVL3 (GOWN DISPOSABLE) ×1 IMPLANT
HOLDER FOLEY CATH W/STRAP (MISCELLANEOUS) ×1 IMPLANT
KIT TURNOVER CYSTO (KITS) ×1 IMPLANT
MBRN O SEALING YLW 17 FOR INST (MISCELLANEOUS) ×1 IMPLANT
MEMBRANE SLNG YLW 17 FOR INST (MISCELLANEOUS) ×1 IMPLANT
MORCELLATOR COLLECT CONTAINER (MISCELLANEOUS) ×1 IMPLANT
MORCELLATOR OVERFLOW FILTER (FILTER) ×1 IMPLANT
MORCELLATOR ROTATION 4.75 335 (MISCELLANEOUS) ×1 IMPLANT
PACK CYSTO AR (MISCELLANEOUS) ×1 IMPLANT
SET CYSTO W/LG BORE CLAMP LF (SET/KITS/TRAYS/PACK) ×1 IMPLANT
SET IRRIG Y TYPE TUR BLADDER L (SET/KITS/TRAYS/PACK) ×1 IMPLANT
SLEEVE PROTECTION STRL DISP (MISCELLANEOUS) ×2 IMPLANT
SOL .9 NS 3000ML IRR UROMATIC (IV SOLUTION) ×5 IMPLANT
SURGILUBE 2OZ TUBE FLIPTOP (MISCELLANEOUS) ×1 IMPLANT
SYR TOOMEY IRRIG 70ML (MISCELLANEOUS) ×1 IMPLANT
SYRINGE TOOMEY IRRIG 70ML (MISCELLANEOUS) ×1 IMPLANT
TUBE PUMP MORCELLATOR PIRANHA (TUBING) ×1 IMPLANT
WATER STERILE IRR 1000ML POUR (IV SOLUTION) ×1 IMPLANT

## 2023-09-25 NOTE — H&P (Signed)
   09/25/23 6:52 AM   Jeffrey Rivers [birth year] [id number]  CC: BPH  HPI: 53 year old male with obstructive urinary symptoms including nocturia, weak stream, straining, and frequency.  Prostate measured 66 g on TRUS and cystoscopy showed obstructive appearing prostate with median lobe.  PSA normal at 0.6.  He opted for HOLEP.   PMH: Past Medical History:  Diagnosis Date   Benign prostatic hyperplasia with urinary obstruction 08/2023   Class 2 severe obesity with serious comorbidity in adult [facility])    Essential hypertension, benign 11/05/2015   Hyperlipidemia    Pre-diabetes     Surgical History: Past Surgical History:  Procedure Laterality Date   APPENDECTOMY  1987   COLONOSCOPY WITH PROPOFOL N/A 08/13/2021   Procedure: COLONOSCOPY WITH PROPOFOL;  Surgeon: Wyline Mood, MD;  Location: [facility] ENDOSCOPY;  Service: Gastroenterology;  Laterality: N/A;   INGUINAL HERNIA REPAIR Right 06/05/2014      Family History: Family History  Problem Relation Age of Onset   Cancer Father 69       colon   Prostate cancer Neg Hx    Kidney cancer Neg Hx     Social History:  reports that he has never smoked. He has never been exposed to tobacco smoke. He has never used smokeless tobacco. He reports that he does not drink alcohol and does not use drugs.  Physical Exam: BP (!) 144/101   Pulse (!) 58   Temp (!) 97 F (36.1 C) (Temporal)   Resp 16   Ht 5\' 10"  (1.778 m)   Wt 117.9 kg   SpO2 98%   BMI 37.31 kg/m    Constitutional:  Alert and oriented, No acute distress. Cardiovascular: Regular rate and rhythm Respiratory: Clear to auscultation bilaterally GI: Abdomen is soft, nontender, nondistended, no abdominal masses   Laboratory Data: Culture 3/25 no growth   Assessment & Plan:   53 year old male with obstructive urinary symptoms, prostate measured 66 g, normal PSA, opted for HOLEP for definitive management.  We discussed the risks and benefits of HoLEP at length.  The  procedure requires general anesthesia and takes 1 to 2 hours, and a holmium laser is used to enucleate the prostate and push this tissue into the bladder.  A morcellator is then used to remove this tissue, which is sent for pathology.  The vast majority(>95%) of patients are able to discharge the same day with a catheter in place for 2 to 3 days, and will follow-up in clinic for a voiding trial.  We specifically discussed the risks of bleeding, infection, retrograde ejaculation, temporary urgency and urge incontinence, very low risk of long-term incontinence, urethral stricture/bladder neck contracture, pathologic evaluation of prostate tissue and possible detection of prostate cancer or other malignancy, and possible need for additional procedures.  HoLEP today   Legrand Rams, MD 09/25/2023  [facility] [address], [address] [place], [place] [address] [phone]

## 2023-09-25 NOTE — Transfer of Care (Signed)
 Immediate Anesthesia Transfer of Care Note  Patient: Jeffrey Rivers  Procedure(s) Performed: ENUCLEATION, PROSTATE, USING LASER, WITH MORCELLATION (Prostate)  Patient Location: PACU  Anesthesia Type:General  Level of Consciousness: awake, alert , oriented, and patient cooperative  Airway & Oxygen Therapy: Patient Spontanous Breathing and Patient connected to face mask oxygen  Post-op Assessment: Report given to RN and Post -op Vital signs reviewed and stable  Post vital signs: Reviewed and stable  Last Vitals:  Vitals Value Taken Time  BP 130/86 09/25/23 0849  Temp 36.4 C 09/25/23 0849  Pulse 82 09/25/23 0853  Resp 12 09/25/23 0853  SpO2 100 % 09/25/23 0853  Vitals shown include unfiled device data.  Last Pain:  Vitals:   09/25/23 0620  TempSrc: Temporal  PainSc: 0-No pain         Complications: No notable events documented.

## 2023-09-25 NOTE — Anesthesia Preprocedure Evaluation (Signed)
 Anesthesia Evaluation  Patient identified by MRN, date of birth, ID band Patient awake    Reviewed: Allergy & Precautions, H&P , NPO status , Patient's Chart, lab work & pertinent test results, reviewed documented beta blocker date and time   Airway Mallampati: II  TM Distance: >3 FB Neck ROM: full    Dental  (+) Teeth Intact   Pulmonary neg pulmonary ROS   Pulmonary exam normal        Cardiovascular Exercise Tolerance: Good hypertension, On Medications negative cardio ROS Normal cardiovascular exam Rhythm:regular Rate:Normal     Neuro/Psych negative neurological ROS  negative psych ROS   GI/Hepatic negative GI ROS, Neg liver ROS,,,  Endo/Other  negative endocrine ROS    Renal/GU negative Renal ROS  negative genitourinary   Musculoskeletal   Abdominal   Peds  Hematology negative hematology ROS (+)   Anesthesia Other Findings Past Medical History: 08/2023: Benign prostatic hyperplasia with urinary obstruction No date: Class 2 severe obesity with serious comorbidity in adult  [facility]) 11/05/2015: Essential hypertension, benign No date: Hyperlipidemia No date: Pre-diabetes Past Surgical History: 1987: APPENDECTOMY 08/13/2021: COLONOSCOPY WITH PROPOFOL; N/A     Comment:  Procedure: COLONOSCOPY WITH PROPOFOL;  Surgeon: Wyline Mood, MD;  [facility] ENDOSCOPY;  Service:               Gastroenterology;  Laterality: N/A; 06/05/2014: INGUINAL HERNIA REPAIR; Right BMI    Body Mass Index: 37.31 kg/m     Reproductive/Obstetrics negative OB ROS                             Anesthesia Physical Anesthesia Plan  ASA: 2  Anesthesia Plan: General ETT   Post-op Pain Management:    Induction:   PONV Risk Score and Plan: 3  Airway Management Planned:   Additional Equipment:   Intra-op Plan:   Post-operative Plan:   Informed Consent: I have reviewed the patients  History and Physical, chart, labs and discussed the procedure including the risks, benefits and alternatives for the proposed anesthesia with the patient or authorized representative who has indicated his/her understanding and acceptance.     Dental Advisory Given  Plan Discussed with: CRNA  Anesthesia Plan Comments:        Anesthesia Quick Evaluation

## 2023-09-25 NOTE — Op Note (Signed)
 Date of procedure: 09/25/23  Preoperative diagnosis:  BPH with obstruction  Postoperative diagnosis:  Same  Procedure: HoLEP (Holmium Laser Enucleation of the Prostate)  Surgeon: Legrand Rams, MD  Anesthesia: General  Complications: None  Intraoperative findings:  Moderate size prostate with high bladder neck, large and broad median lobe, mild bladder trabeculations Excellent hemostasis, ureteral orifices and verumontanum intact at conclusion of case  EBL: Minimal  Specimens: Prostate chips  Enucleation time: 16 minutes  Morcellation time: 30 minutes  Intra-op weight: 30 g  Drains: 24 French three-way, 60 cc in balloon  Indication: Mahlon L Bencosme is a 53 y.o. patient with BPH and obstructive symptoms despite Flomax who opted for HOLEP.  After reviewing the management options for treatment, they elected to proceed with the above surgical procedure(s). We have discussed the potential benefits and risks of the procedure, side effects of the proposed treatment, the likelihood of the patient achieving the goals of the procedure, and any potential problems that might occur during the procedure or recuperation.  We specifically discussed the risks of bleeding, infection, hematuria and clot retention, need for additional procedures, possible overnight hospital stay, temporary urgency and incontinence, rare long-term incontinence, and retrograde ejaculation.  Informed consent has been obtained.   Description of procedure:  The patient was taken to the operating room and general anesthesia was induced.  The patient was placed in the dorsal lithotomy position, prepped and draped in the usual sterile fashion, and preoperative antibiotics(Ancef) were administered.  SCDs were placed for DVT prophylaxis.  A preoperative time-out was performed.   Sissy Hoff sounds were used to gently dilated the urethra up to 110F. The 58 French continuous flow resectoscope was inserted into the urethra using  the visual obturator  The prostate was large with obstructing lateral lobes and a high bladder neck, large broad-based median lobe. The bladder was thoroughly inspected and notable for mild trabeculations.  The ureteral orifices were located in orthotopic position.    The laser was set to 2 J and 60 Hz and early apical release was performed by making a circumferential mucosal incision proximal to the sphincter.  A lambda incision was then made proximal to the verumontanum.  The prostate was enucleated en bloc circumferentially into the bladder.  The capsule was examined and laser was used for meticulous hemostasis.    The 61 French resectoscope was then switched out for the 26 French nephroscope and prostate tissue was morcellated(Piranha) and the tissue sent to pathology.  Morcellation was challenging secondary to fibrous prostate tissue.  A 24 French three-way catheter was inserted easily with the aid of a catheter guide, and 60 cc were placed in the balloon.  Urine was clear.  The catheter irrigated easily with a Toomey syringe.  CBI was initiated.   The patient tolerated the procedure well without any immediate complications and was extubated and transferred to the recovery room in stable condition.  Urine was clear on fast CBI.  Disposition: Stable to PACU  Plan: Wean CBI in PACU, anticipate discharge home today with Foley removal in clinic in 2-3 days  Legrand Rams, MD 09/25/2023

## 2023-09-25 NOTE — Anesthesia Procedure Notes (Signed)
 Procedure Name: Intubation Date/Time: 09/25/2023 7:35 AM  Performed by: Rich Brave, CRNAPre-anesthesia Checklist: Patient identified, Emergency Drugs available, Suction available, Patient being monitored and Timeout performed Patient Re-evaluated:Patient Re-evaluated prior to induction Oxygen Delivery Method: Circle system utilized Preoxygenation: Pre-oxygenation with 100% oxygen Induction Type: IV induction Ventilation: Mask ventilation without difficulty Laryngoscope Size: McGrath and 4 Grade View: Grade I Tube type: Oral Tube size: 7.0 mm Number of attempts: 1 Airway Equipment and Method: Stylet and Video-laryngoscopy Placement Confirmation: positive ETCO2, breath sounds checked- equal and bilateral and ETT inserted through vocal cords under direct vision Secured at: 24 cm Tube secured with: Tape Dental Injury: Teeth and Oropharynx as per pre-operative assessment

## 2023-09-25 NOTE — Progress Notes (Signed)
 CBI weaned & d/c and Foley inflow capped at 0930 as ordered. Urine is clear with no blood noted.

## 2023-09-25 NOTE — Anesthesia Postprocedure Evaluation (Signed)
 Anesthesia Post Note  Patient: Jeffrey Rivers  Procedure(s) Performed: ENUCLEATION, PROSTATE, USING LASER, WITH MORCELLATION (Prostate)  Patient location during evaluation: PACU Anesthesia Type: General Level of consciousness: awake and alert Pain management: pain level controlled Vital Signs Assessment: post-procedure vital signs reviewed and stable Respiratory status: spontaneous breathing, nonlabored ventilation, respiratory function stable and patient connected to nasal cannula oxygen Cardiovascular status: blood pressure returned to baseline and stable Postop Assessment: no apparent nausea or vomiting Anesthetic complications: no   No notable events documented.   Last Vitals:  Vitals:   09/25/23 0930 09/25/23 0947  BP: (!) 140/96 136/89  Pulse: 60 (!) 53  Resp: 17 18  Temp: 36.5 C 36.5 C  SpO2: 99% 99%    Last Pain:  Vitals:   09/25/23 0947  TempSrc: Temporal  PainSc: 0-No pain                 Yevette Edwards

## 2023-09-26 ENCOUNTER — Encounter: Payer: Self-pay | Admitting: Urology

## 2023-09-27 ENCOUNTER — Emergency Department
Admission: EM | Admit: 2023-09-27 | Discharge: 2023-09-27 | Disposition: A | Discharge status: Home / Self Care | Attending: Emergency Medicine | Admitting: Emergency Medicine

## 2023-09-27 ENCOUNTER — Other Ambulatory Visit: Payer: Self-pay

## 2023-09-27 DIAGNOSIS — B9689 Other specified bacterial agents as the cause of diseases classified elsewhere: Secondary | ICD-10-CM | POA: Diagnosis not present

## 2023-09-27 DIAGNOSIS — N390 Urinary tract infection, site not specified: Secondary | ICD-10-CM | POA: Diagnosis not present

## 2023-09-27 DIAGNOSIS — I10 Essential (primary) hypertension: Secondary | ICD-10-CM | POA: Insufficient documentation

## 2023-09-27 DIAGNOSIS — R300 Dysuria: Secondary | ICD-10-CM | POA: Diagnosis not present

## 2023-09-27 LAB — URINALYSIS, ROUTINE W REFLEX MICROSCOPIC
Bilirubin Urine: NEGATIVE
Glucose, UA: NEGATIVE mg/dL
Ketones, ur: NEGATIVE mg/dL
Nitrite: NEGATIVE
Protein, ur: >=300 mg/dL — AB
RBC / HPF: >50 RBC/hpf (ref 0–5)
Specific Gravity, Urine: 1.011 (ref 1.005–1.030)
Squamous Epithelial / HPF: 0 /HPF (ref 0–5)
WBC, UA: >50 WBC/hpf (ref 0–5)
pH: 6 (ref 5.0–8.0)

## 2023-09-27 MED ORDER — PHENAZOPYRIDINE HCL 100 MG PO TABS: 100.0000 mg | ORAL_TABLET | Freq: Three times a day (TID) | ORAL | 0 refills | Status: DC | PRN

## 2023-09-27 MED ORDER — PHENAZOPYRIDINE HCL 100 MG PO TABS
95.0000 mg | ORAL_TABLET | Freq: Once | ORAL | Status: AC
  Administered 2023-09-27: 100 mg via ORAL
  Filled 2023-09-27: qty 1

## 2023-09-27 MED ORDER — LEVOFLOXACIN 750 MG PO TABS
750.0000 mg | ORAL_TABLET | Freq: Once | ORAL | Status: AC
  Administered 2023-09-27: 750 mg via ORAL
  Filled 2023-09-27: qty 1

## 2023-09-27 MED ORDER — LEVOFLOXACIN 750 MG PO TABS: 750.0000 mg | ORAL_TABLET | Freq: Every day | ORAL | 0 refills | Status: AC

## 2023-09-27 NOTE — ED Notes (Signed)
 Irrigated catheter with 70mL of NS, NADN, pt tolerated well.

## 2023-09-27 NOTE — Discharge Instructions (Addendum)
 You have been diagnosed with urinary tract infection.  Please take levofloxacin 1 tablet by mouth daily for 7 days.  Please take Pyridium 1 tablet by mouth 3 times daily as needed for pain.  Please drink plenty fluids.  This call tomorrow to your appointment with urology for a follow-up.  Come back to ED or go to your PCP if you have new symptoms or symptoms worsen

## 2023-09-27 NOTE — ED Notes (Signed)
 This RN removed original urinary cath to replace with new, pts wife asked if pt was able to urinate if the cath needed to be replaced as pt is scheduled to have cath removed 09/28/23 at 1100. Pt was able to void without difficulty while this RN spoke with provider. Provider made aware that pt is able to void without difficulty. Pt given strict instructions to return to ED if any issues voiding before his appointment tomorrow morning.

## 2023-09-27 NOTE — ED Triage Notes (Signed)
 Pt to ED via POV from home. Pt ambulatory to triage with foley in place. Foley placed on 4/11 for urinary obstruction. Pt reports feels he is not outputting as much. Pt reports emptied foley bag this am. Pt currently has in bag currently. Pt reports pain when he feels like he has to urinate.

## 2023-09-27 NOTE — ED Provider Notes (Signed)
 [facility] Provider Note    Event Date/Time   First MD Initiated Contact with Jeffrey Rivers 09/27/23 1842     (approximate)   History   Urinary Retention    HPI  Jeffrey Rivers is a 53 y.o. male    with a past medical history of hypertension, prostatic hyperplasia  who presents to the ED complaining of dysuria   According to the Jeffrey Rivers, Jeffrey Rivers had laser surgery last Friday, urology placed at urinary catheter that she will be removed tomorrow at noon.  Jeffrey Rivers states since this morning he noticed leaking of the catheter, dysuria when he needs to urinate.  Jeffrey Rivers denies fever, chills, nauseas, vomit.  Per Jeffrey Rivers the urine in the bag changed to pink color, he noticed the presence of blood clots.       Physical Exam   Triage Vital Signs: ED Triage Vitals [09/27/23 1757]  Encounter Vitals Group     BP (!) 160/99     Systolic BP Percentile      Diastolic BP Percentile      Pulse Rate 81     Resp 20     Temp 97.9 F (36.6 C)     Temp Source Oral     SpO2 98 %     Weight      Height      Head Circumference      Peak Flow      Pain Score 5     Pain Loc      Pain Education      Exclude from Growth Chart     Most recent vital signs: Vitals:   09/27/23 1757  BP: (!) 160/99  Pulse: 81  Resp: 20  Temp: 97.9 F (36.6 C)  SpO2: 98%     Constitutional: Alert, NAD. Able to speak in complete sentences without cough or dyspnea  Eyes: Conjunctivae are normal.  Head: Atraumatic. Nose: No congestion/rhinnorhea. Mouth/Throat: Mucous membranes are moist.   Neck: Painless ROM. Supple. No JVD, nodes, thyromegaly  Cardiovascular:   Good peripheral circulation.RRR no murmurs, gallops, rubs  Respiratory: Normal respiratory effort.  No retractions. Clear to auscultation bilaterally without wheezing or crackles  Gastrointestinal: Soft and nontender.  Musculoskeletal:  no deformity.  Bilateral CVA negative Neurologic:  MAE spontaneously. No gross focal  neurologic deficits are appreciated.  Skin:  Skin is warm, dry and intact. No rash noted. Psychiatric: Mood and affect are normal. Speech and behavior are normal.    ED Results / Procedures / Treatments   Labs (all labs ordered are listed, but only abnormal results are displayed) Labs Reviewed  URINALYSIS, ROUTINE W REFLEX MICROSCOPIC - Abnormal; Notable for the following components:      Result Value   Color, Urine AMBER (*)    APPearance CLOUDY (*)    Hgb urine dipstick MODERATE (*)    Protein, ur >=300 (*)    Leukocytes,Ua TRACE (*)    Bacteria, UA MANY (*)    All other components within normal limits     EKG     RADIOLOGY     PROCEDURES:  Critical Care performed:   Procedures   MEDICATIONS ORDERED IN ED: Medications  levofloxacin (LEVAQUIN) tablet 750 mg (has no administration in time range)  phenazopyridine (PYRIDIUM) tablet 100 mg (has no administration in time range)   Clinical Course as of 09/27/23 2250  Sun Sep 27, 2023  2056 Per nurse she irrigated the catheter, it was difficult for the urine to go  through.  We need to replace the catheter. [AE]  2241 Urinalysis, Routine w reflex microscopic -Urine, Clean Catch(!) Bacteria many, leukocytes trace [AE]    Clinical Course User Index [AE] Awilda Lennox, PA-C    IMPRESSION / MDM / ASSESSMENT AND PLAN / ED COURSE  I reviewed the triage vital signs and the nursing notes.  Differential diagnosis includes, but is not limited to, UTI, catheter displacement, hematuria  Jeffrey Rivers's presentation is most consistent with acute, uncomplicated illness.  Jeffrey Rivers's diagnosis is consistent with UTI. Labs are  reassuring. I did review the Jeffrey Rivers's allergies and medications.During admission the catheter was removed after irrigation failed.Jeffrey Rivers was able to urinate with no signs of obstruction.  Jeffrey Rivers received first doses of antibiotic and Pyridium. The Jeffrey Rivers is in stable and satisfactory condition for  discharge home  Jeffrey Rivers will be discharged home with prescriptions for levofloxacin, Pyridium. Jeffrey Rivers is to follow up with neurology tomorrow as needed or otherwise directed. Jeffrey Rivers is given ED precautions to return to the ED for any worsening or new symptoms. Discussed plan of care with Jeffrey Rivers, answered all of Jeffrey Rivers's questions, Jeffrey Rivers agreeable to plan of care. Advised Jeffrey Rivers to take medications according to the instructions on the label. Discussed possible side effects of new medications. Jeffrey Rivers verbalized understanding.    FINAL CLINICAL IMPRESSION(S) / ED DIAGNOSES   Final diagnoses:  Lower urinary tract infectious disease     Rx / DC Orders   ED Discharge Orders          Ordered    levofloxacin (LEVAQUIN) 750 MG tablet  Daily        09/27/23 2249    phenazopyridine (PYRIDIUM) 100 MG tablet  3 times daily PRN        09/27/23 2249             Note:  This document was prepared using Dragon voice recognition software and may include unintentional dictation errors.   Awilda Lennox, PA-C 09/27/23 2250    Bryson Carbine, MD 10/01/23 [phone]

## 2023-09-28 ENCOUNTER — Encounter: Payer: Self-pay | Admitting: Physician Assistant

## 2023-09-28 ENCOUNTER — Ambulatory Visit (INDEPENDENT_AMBULATORY_CARE_PROVIDER_SITE_OTHER): Admitting: Physician Assistant

## 2023-09-28 VITALS — BP 149/91 | HR 70 | Ht 70.0 in | Wt 263.0 lb

## 2023-09-28 DIAGNOSIS — N138 Other obstructive and reflux uropathy: Secondary | ICD-10-CM | POA: Diagnosis not present

## 2023-09-28 DIAGNOSIS — N401 Enlarged prostate with lower urinary tract symptoms: Secondary | ICD-10-CM

## 2023-09-28 LAB — SURGICAL PATHOLOGY

## 2023-09-28 LAB — BLADDER SCAN AMB NON-IMAGING

## 2023-09-28 NOTE — Patient Instructions (Signed)

## 2023-09-28 NOTE — Progress Notes (Signed)
 Patient presented to clinic today for postop Foley removal.  He is POD 3 from HOLEP with Dr. Estanislao Heimlich.  He was seen in the emergency department yesterday evening with concerns for decreased urinary output and catheter occlusion.  His Foley catheter was removed and he was subsequently able to void, so the catheter was not replaced.  He was started on empiric Levaquin x 7 days for possible UTI.   He is voiding and reports mild dysuria with termination. PVR 9mL. I counseled him to complete abx as prescribed. I also provided him a note to return to work with 15lb lifting restrictions in 1 week and full duty on POD14.  Counseled patient on normal postoperative findings including dysuria, gross hematuria, and urinary urgency/leakage. Counseled patient to begin Kegel exercises 3x10 sets daily to increase urinary control and wear absorbent products as needed for security. Written and verbal resources provided today. Surgical pathology pending; will defer to Dr. Estanislao Heimlich to share results when available.   Results for orders placed or performed in visit on 09/28/23  Bladder Scan (Post Void Residual) in office   Collection Time: 09/28/23 12:03 PM  Result Value Ref Range   Scan Result 9mL

## 2023-10-12 ENCOUNTER — Other Ambulatory Visit: Payer: Self-pay

## 2023-10-12 ENCOUNTER — Ambulatory Visit (INDEPENDENT_AMBULATORY_CARE_PROVIDER_SITE_OTHER): Payer: Self-pay | Admitting: Internal Medicine

## 2023-10-12 ENCOUNTER — Encounter: Payer: Self-pay | Admitting: Internal Medicine

## 2023-10-12 VITALS — BP 136/84 | HR 88 | Temp 98.1°F | Resp 16 | Ht 70.0 in | Wt 258.3 lb

## 2023-10-12 DIAGNOSIS — R7303 Prediabetes: Secondary | ICD-10-CM | POA: Diagnosis not present

## 2023-10-12 DIAGNOSIS — I10 Essential (primary) hypertension: Secondary | ICD-10-CM | POA: Diagnosis not present

## 2023-10-12 DIAGNOSIS — Z6837 Body mass index (BMI) 37.0-37.9, adult: Secondary | ICD-10-CM

## 2023-10-12 DIAGNOSIS — E782 Mixed hyperlipidemia: Secondary | ICD-10-CM | POA: Diagnosis not present

## 2023-10-12 DIAGNOSIS — E66812 Obesity, class 2: Secondary | ICD-10-CM | POA: Diagnosis not present

## 2023-10-12 MED ORDER — LISINOPRIL 10 MG PO TABS: 10.0000 mg | ORAL_TABLET | Freq: Every day | ORAL | 1 refills | Status: DC

## 2023-10-12 MED ORDER — AMLODIPINE BESYLATE 10 MG PO TABS: ORAL_TABLET | ORAL | 1 refills | Status: DC

## 2023-10-12 MED ORDER — WEGOVY 0.5 MG/0.5ML ~~LOC~~ SOAJ
0.5000 mg | SUBCUTANEOUS | 2 refills | Status: DC
Start: 2023-10-12 — End: 2024-01-15

## 2023-10-12 MED ORDER — ROSUVASTATIN CALCIUM 5 MG PO TABS: 5.0000 mg | ORAL_TABLET | Freq: Every day | ORAL | 1 refills | Status: DC

## 2023-10-12 NOTE — Progress Notes (Signed)
 Established Patient Office Visit  Subjective   Patient ID: Jeffrey Rivers, male    DOB: 1970/08/25  Age: 53 y.o. MRN: [id number]  Chief Complaint  Patient presents with   Medical Management of Chronic Issues    3 month    HPI  Jeffrey Rivers presents for follow up on chronic medical conditions.   Discussed the use of AI scribe software for clinical note transcription with the patient, who gave verbal consent to proceed.  History of Present Illness The patient, with a history of prediabetes, hypertension, and hyperlipidemia, recently underwent a prostate biopsy. The procedure was initially uneventful, but complications arose two days later, leading to an ER visit due to a blocked catheter. The catheter was removed, and the patient has been recovering well since. He reports some persistent hematuria, which is expected to resolve within three weeks post-procedure. The patient completed a course of antibiotics following the procedure.  In terms of his chronic conditions, the patient is managing his prediabetes with Wegovy  0.5mg . He reports continued weight loss, although his exercise routine has been disrupted due to the recent procedure. His last A1c in January was 6.0. The patient's hypertension is managed with amlodipine  and lisinopril , and he reports occasional high readings, particularly during stressful situations such as his recent ER visit and procedure. His cholesterol is managed with Crestor , although there seems to be some confusion about the prescription's status.   Hypertension: -Medications: Lisinopril  10 mg, Amlodipine  10 mg -Patient is mostly compliant with above medications and reports no side effects generally. Sometimes forgets to take medications so he is taking them all at once at night, which sometime causes dizziness, fatigue. -Checking BP at home (average): not checking -Denies any SOB, CP, vision changes, LE edema  HLD: -Medications: Crestor  5 mg - not taking  currently, didn't have it available to pick up at pharmacy  -Last lipid panel: Lipid Panel     Component Value Date/Time   CHOL 194 04/09/2023 1518   CHOL 217 (H) 11/05/2015 1518   TRIG 206 (H) 04/09/2023 1518   HDL 54 04/09/2023 1518   HDL 59 11/05/2015 1518   CHOLHDL 3.6 04/09/2023 1518   VLDL 11 02/02/2017 0838   LDLCALC 108 (H) 04/09/2023 1518   LABVLDL 12 11/05/2015 1518   The 10-year ASCVD risk score (Arnett DK, et al., 2019) is: 9.5%   Values used to calculate the score:     Age: 63 years     Sex: Male     Is Non-Hispanic African American: Yes     Diabetic: No     Tobacco smoker: No     Systolic Blood Pressure: 136 mmHg     Is BP treated: Yes     HDL Cholesterol: 54 mg/dL     Total Cholesterol: 194 mg/dL  Pre-Diabetes/Obesity: -Last A1c: 1/25 6.0% -Increased Wegovy  to 0.5 mg at LOV - missed some doses due to prostate biopsy, ready to get back on track -Denies side effects other than mild heart burn symptoms  BPH: -Currently on Flomax  0.4 mg BID, having more nocturia lately and increased urinary urgency  -Denies dysuria, hematuria -Prostate biopsy negative   Health Maintenance: -Blood work UTD -Colon cancer screening: colonoscopy 2/23, repeat in 5 years   Patient Active Problem List   Diagnosis Date Noted   Low testosterone  in male 06/12/2020   History of snoring 06/12/2020   Prediabetes 05/21/2018   Class 2 severe obesity with serious comorbidity and body mass index (BMI) of  37.0 to 37.9 in adult [facility]) 03/22/2016   Hyperlipidemia 03/17/2016   Hesitancy 12/19/2015   Benign prostatic hyperplasia with urinary obstruction 11/29/2015   Essential hypertension, benign 11/05/2015   LVH (left ventricular hypertrophy) 11/05/2015   Past Medical History:  Diagnosis Date   Benign prostatic hyperplasia with urinary obstruction 08/2023   Class 2 severe obesity with serious comorbidity in adult [facility])    Essential hypertension, benign 11/05/2015   Hyperlipidemia     Pre-diabetes    Past Surgical History:  Procedure Laterality Date   APPENDECTOMY  1987   COLONOSCOPY WITH PROPOFOL  N/A 08/13/2021   Procedure: COLONOSCOPY WITH PROPOFOL ;  Surgeon: Luke Salaam, MD;  Location: [facility] ENDOSCOPY;  Service: Gastroenterology;  Laterality: N/A;   HOLEP-LASER ENUCLEATION OF THE PROSTATE WITH MORCELLATION N/A 09/25/2023   Procedure: ENUCLEATION, PROSTATE, USING LASER, WITH MORCELLATION;  Surgeon: Lawerence Pressman, MD;  Location: [facility] ORS;  Service: Urology;  Laterality: N/A;   INGUINAL HERNIA REPAIR Right 06/05/2014   Social History   Tobacco Use   Smoking status: Never    Passive exposure: Never   Smokeless tobacco: Never  Vaping Use   Vaping status: Never Used  Substance Use Topics   Alcohol use: No   Drug use: No   Social History   Socioeconomic History   Marital status: Married    Spouse name: Jeffrey Rivers   Number of children: 1   Years of education: Not on file   Highest education level: Not on file  Occupational History   Not on file  Tobacco Use   Smoking status: Never    Passive exposure: Never   Smokeless tobacco: Never  Vaping Use   Vaping status: Never Used  Substance and Sexual Activity   Alcohol use: No   Drug use: No   Sexual activity: Not Currently  Other Topics Concern   Not on file  Social History Narrative   Not on file   Social Drivers of Health   Financial Resource Strain: Low Risk  (08/09/2020)   Overall Financial Resource Strain (CARDIA)    Difficulty of Paying Living Expenses: Not hard at all  Food Insecurity: No Food Insecurity (08/09/2020)   Hunger Vital Sign    Worried About Running Out of Food in the Last Year: Never Jeffrey    Ran Out of Food in the Last Year: Never Jeffrey  Transportation Needs: No Transportation Needs (08/09/2020)   PRAPARE - Administrator, Civil Service (Medical): No    Lack of Transportation (Non-Medical): No  Physical Activity: Insufficiently Active (08/09/2020)   Exercise Vital Sign     Days of Exercise per Week: 3 days    Minutes of Exercise per Session: 40 min  Stress: Not on file (04/23/2023)  Social Connections: Socially Integrated (08/09/2020)   Social Connection and Isolation Panel [NHANES]    Frequency of Communication with Friends and Family: More than three times a week    Frequency of Social Gatherings with Friends and Family: More than three times a week    Attends Religious Services: More than 4 times per year    Active Member of [employer] or Organizations: No    Attends Engineer, structural: More than 4 times per year    Marital Status: Married  Catering manager Violence: Not At Risk (08/09/2020)   Humiliation, Afraid, Rape, and Kick questionnaire    Fear of Current or Ex-Partner: No    Emotionally Abused: No    Physically Abused: No    Sexually  Abused: No   Family Status  Relation Name Status   Mother  Alive   Father  Deceased   Neg Hx  (Not Specified)  No partnership data on file   Family History  Problem Relation Age of Onset   Cancer Father 69       colon   Prostate cancer Neg Hx    Kidney cancer Neg Hx    No Known Allergies    Review of Systems  All other systems reviewed and are negative.     Objective:     BP 136/84 (Cuff Size: Large)   Pulse 88   Temp 98.1 F (36.7 C) (Oral)   Resp 16   Ht 5\' 10"  (1.778 m)   Wt 258 lb 4.8 oz (117.2 kg)   SpO2 98%   BMI 37.06 kg/m  BP Readings from Last 3 Encounters:  10/12/23 136/84  09/28/23 (!) 149/91  09/27/23 132/83   Wt Readings from Last 3 Encounters:  10/12/23 258 lb 4.8 oz (117.2 kg)  09/28/23 263 lb (119.3 kg)  09/25/23 260 lb (117.9 kg)      Physical Exam Constitutional:      Appearance: Normal appearance.  HENT:     Head: Normocephalic and atraumatic.  Eyes:     Conjunctiva/sclera: Conjunctivae normal.  Cardiovascular:     Rate and Rhythm: Normal rate and regular rhythm.  Pulmonary:     Effort: Pulmonary effort is normal.     Breath sounds: Normal breath  sounds.  Skin:    General: Skin is warm and dry.  Neurological:     General: No focal deficit present.     Mental Status: He is alert. Mental status is at baseline.  Psychiatric:        Mood and Affect: Mood normal.      No results found for any visits on 10/12/23.   Last CBC Lab Results  Component Value Date   WBC 4.0 09/18/2023   HGB 14.1 09/18/2023   HCT 42.0 09/18/2023   MCV 81.7 09/18/2023   MCH 27.4 09/18/2023   RDW 13.3 09/18/2023   PLT 204 09/18/2023   Last metabolic panel Lab Results  Component Value Date   GLUCOSE 102 (H) 09/18/2023   NA 138 09/18/2023   K 3.7 09/18/2023   CL 103 09/18/2023   CO2 27 09/18/2023   BUN 18 09/18/2023   CREATININE 0.95 09/18/2023   EGFR 87 04/09/2023   CALCIUM  9.3 09/18/2023   PROT 6.7 04/09/2023   ALBUMIN 4.3 02/02/2017   BILITOT 0.3 04/09/2023   ALKPHOS 43 02/02/2017   AST 21 04/09/2023   ALT 27 04/09/2023   ANIONGAP 8 09/18/2023   Last lipids Lab Results  Component Value Date   CHOL 194 04/09/2023   HDL 54 04/09/2023   LDLCALC 108 (H) 04/09/2023   TRIG 206 (H) 04/09/2023   CHOLHDL 3.6 04/09/2023   Last hemoglobin A1c Lab Results  Component Value Date   HGBA1C 6.0 (A) 07/13/2023   Last thyroid functions Lab Results  Component Value Date   TSH 1.45 12/12/2021   Last vitamin D  Lab Results  Component Value Date   VD25OH 34 12/12/2021   Last vitamin B12 and Folate Lab Results  Component Value Date   VITAMINB12 710 02/25/2019      The 10-year ASCVD risk score (Arnett DK, et al., 2019) is: 9.5%    Assessment & Plan:   Assessment & Plan Prostate biopsy follow-up Biopsy negative for cancer. Catheter blockage resolved.  Residual hematuria expected to resolve in three weeks. - Monitor hematuria resolution. - Encourage gradual return to physical activity. - Contact urologist if issues arise.  Hypertension Blood pressure elevated during ER visit due to stress. Currently controlled at 136/84 mmHg. -  Continue amlodipine  and lisinopril  current doses. - Refill amlodipine  and lisinopril .  Hyperlipidemia Managed with rosuvastatin . - Refill rosuvastatin  5 mg. - Send prescription to [facility].  Prediabetes/Obesity A1c at 6.0% indicates good control. Weight loss ongoing with Wegovy  0.5 mg. No significant side effects. Discussed potential side effects of higher doses. Decision to maintain current dose. - Continue Wegovy  0.5 mg. - Refill Wegovy . - Recheck A1c in three months. - Discuss potential dose increase if weight loss plateaus.  - Semaglutide -Weight Management (WEGOVY ) 0.5 MG/0.5ML SOAJ; Inject 0.5 mg into the skin once a week.  Dispense: 2 mL; Refill: 2 - rosuvastatin  (CRESTOR ) 5 MG tablet; Take 1 tablet (5 mg total) by mouth daily.  Dispense: 90 tablet; Refill: 1 - amLODipine  (NORVASC ) 10 MG tablet; TAKE 1 TABLET(10 MG) BY MOUTH DAILY  Dispense: 90 tablet; Refill: 1 - lisinopril  (ZESTRIL ) 10 MG tablet; Take 1 tablet (10 mg total) by mouth daily.  Dispense: 90 tablet; Refill: 1    Return in about 3 months (around 01/11/2024).    Rockney Cid, DO

## 2023-10-23 ENCOUNTER — Encounter: Payer: Self-pay | Admitting: Physician Assistant

## 2023-10-23 ENCOUNTER — Ambulatory Visit (INDEPENDENT_AMBULATORY_CARE_PROVIDER_SITE_OTHER): Admitting: Physician Assistant

## 2023-10-23 VITALS — BP 126/87 | HR 71 | Ht 70.0 in | Wt 255.6 lb

## 2023-10-23 DIAGNOSIS — R350 Frequency of micturition: Secondary | ICD-10-CM

## 2023-10-23 LAB — URINALYSIS, COMPLETE
Bilirubin, UA: NEGATIVE
Glucose, UA: NEGATIVE
Ketones, UA: NEGATIVE
Nitrite, UA: NEGATIVE
Specific Gravity, UA: 1.03 (ref 1.005–1.030)
Urobilinogen, Ur: 0.2 mg/dL (ref 0.2–1.0)
pH, UA: 6 (ref 5.0–7.5)

## 2023-10-23 LAB — MICROSCOPIC EXAMINATION
RBC, Urine: >30 /HPF — AB (ref 0–2)
WBC, UA: >30 /HPF — AB (ref 0–5)

## 2023-10-23 LAB — BLADDER SCAN AMB NON-IMAGING

## 2023-10-23 MED ORDER — SULFAMETHOXAZOLE-TRIMETHOPRIM 800-160 MG PO TABS: 1.0000 | ORAL_TABLET | Freq: Two times a day (BID) | ORAL | 0 refills | Status: AC

## 2023-10-23 NOTE — Progress Notes (Signed)
 10/23/2023 4:28 PM   Jeffrey Rivers 11-06-1970 [id number]  CC: Chief Complaint  Patient presents with   Urinary Frequency   Dysuria   HPI: Jeffrey Rivers is a 53 y.o. male with PMH BPH who underwent HOLEP with Dr. Estanislao Heimlich on 09/25/2023 who presents today for evaluation of urinary frequency and dysuria.   He was started on empiric Levaquin  x 7 days for possible UTI in the emergency department 2 days postop in the setting of catheter dysfunction.  Today he reports 2 weeks of urgency, dysuria especially with termination, and pain/pressure in the perineum.  He denies fever, chills, nausea, vomiting, or gross hematuria.  He denies a weak or split stream.  In-office UA today positive for 2+ protein, 3+ blood, and 1+ leukocytes; urine microscopy with >30 WBCs/HPF, >30 RBCs/HPF, and moderate bacteria. PVR 0mL.  PMH: Past Medical History:  Diagnosis Date   Benign prostatic hyperplasia with urinary obstruction 08/2023   Class 2 severe obesity with serious comorbidity in adult [facility])    Essential hypertension, benign 11/05/2015   Hyperlipidemia    Pre-diabetes     Surgical History: Past Surgical History:  Procedure Laterality Date   APPENDECTOMY  1987   COLONOSCOPY WITH PROPOFOL  N/A 08/13/2021   Procedure: COLONOSCOPY WITH PROPOFOL ;  Surgeon: Luke Salaam, MD;  Location: [facility] ENDOSCOPY;  Service: Gastroenterology;  Laterality: N/A;   HOLEP-LASER ENUCLEATION OF THE PROSTATE WITH MORCELLATION N/A 09/25/2023   Procedure: ENUCLEATION, PROSTATE, USING LASER, WITH MORCELLATION;  Surgeon: Lawerence Pressman, MD;  Location: [facility] ORS;  Service: Urology;  Laterality: N/A;   INGUINAL HERNIA REPAIR Right 06/05/2014    Home Medications:  Allergies as of 10/23/2023   No Known Allergies      Medication List        Accurate as of Oct 23, 2023  4:28 PM. If you have any questions, ask your nurse or doctor.          amLODipine  10 MG tablet Commonly known as: NORVASC  TAKE 1 TABLET(10 MG) BY  MOUTH DAILY   lisinopril  10 MG tablet Commonly known as: ZESTRIL  Take 1 tablet (10 mg total) by mouth daily.   rosuvastatin  5 MG tablet Commonly known as: Crestor  Take 1 tablet (5 mg total) by mouth daily.   Wegovy  0.5 MG/0.5ML Soaj Generic drug: Semaglutide -Weight Management Inject 0.5 mg into the skin once a week.        Allergies:  No Known Allergies  Family History: Family History  Problem Relation Age of Onset   Cancer Father 69       colon   Prostate cancer Neg Hx    Kidney cancer Neg Hx     Social History:   reports that he has never smoked. He has never been exposed to tobacco smoke. He has never used smokeless tobacco. He reports that he does not drink alcohol and does not use drugs.  Physical Exam: BP 126/87   Pulse 71   Ht 5\' 10"  (1.778 m)   Wt 255 lb 9.6 oz (115.9 kg)   BMI 36.67 kg/m   Constitutional:  Alert and oriented, no acute distress, nontoxic appearing HEENT: [place], AT Cardiovascular: No clubbing, cyanosis, or edema Respiratory: Normal respiratory effort, no increased work of breathing Skin: No rashes, bruises or suspicious lesions Neurologic: Grossly intact, no focal deficits, moving all 4 extremities Psychiatric: Normal mood and affect  Laboratory Data: Results for orders placed or performed in visit on 10/23/23  Microscopic Examination   Collection Time: 10/23/23  3:51 PM   Urine  Result Value Ref Range   WBC, UA >30 (A) 0 - 5 /hpf   RBC, Urine >30 (A) 0 - 2 /hpf   Epithelial Cells (non renal) 0-10 0 - 10 /hpf   Mucus, UA Present (A) Not Estab.   Bacteria, UA Moderate (A) None seen/Few  Urinalysis, Complete   Collection Time: 10/23/23  3:51 PM  Result Value Ref Range   Specific Gravity, UA 1.030 1.005 - 1.030   pH, UA 6.0 5.0 - 7.5   Color, UA Yellow Yellow   Appearance Ur Slightly cloudy Clear   Leukocytes,UA 1+ (A) Negative   Protein,UA 2+ (A) Negative/Trace   Glucose, UA Negative Negative   Ketones, UA Negative Negative    RBC, UA 3+ (A) Negative   Bilirubin, UA Negative Negative   Urobilinogen, Ur 0.2 0.2 - 1.0 mg/dL   Nitrite, UA Negative Negative   Microscopic Examination See below:   BLADDER SCAN AMB NON-IMAGING   Collection Time: 10/23/23  3:55 PM  Result Value Ref Range   Scan Result 0ml    Assessment & Plan:   1. Urinary frequency (Primary) UA appears grossly positive, unclear if this indicates infection versus postop status, however with reports of dysuria and perineal pressure, I question possible postop bacterial prostatitis.  Fortunately, no signs of systemic infection today.  Will start empiric Bactrim DS twice daily x 4 weeks and send for culture for further evaluation.  I also prepped his urethral meatus with Betadine and probed his fossa navicularis with a lidocaine  jelly applicator, no stricture noted. - Urinalysis, Complete - BLADDER SCAN AMB NON-IMAGING - CULTURE, URINE COMPREHENSIVE - sulfamethoxazole-trimethoprim (BACTRIM DS) 800-160 MG tablet; Take 1 tablet by mouth 2 (two) times daily for 28 days.  Dispense: 56 tablet; Refill: 0   Return for Keep follow-up as scheduled.  Kathreen Pare, PA-C  [facility] [place] [address] [place], [place] [address] [phone]

## 2023-10-29 LAB — CULTURE, URINE COMPREHENSIVE

## 2023-10-30 ENCOUNTER — Ambulatory Visit: Payer: Self-pay | Admitting: Physician Assistant

## 2023-11-02 MED ORDER — LEVOFLOXACIN 500 MG PO TABS
500.0000 mg | ORAL_TABLET | Freq: Every day | ORAL | 0 refills | Status: AC
Start: 2023-11-02 — End: 2023-11-16

## 2023-11-12 ENCOUNTER — Ambulatory Visit: Admitting: Physician Assistant

## 2023-11-13 ENCOUNTER — Encounter: Payer: Self-pay | Admitting: Physician Assistant

## 2023-11-13 ENCOUNTER — Ambulatory Visit (INDEPENDENT_AMBULATORY_CARE_PROVIDER_SITE_OTHER): Admitting: Physician Assistant

## 2023-11-13 VITALS — BP 132/87 | HR 73 | Ht 70.0 in | Wt 257.8 lb

## 2023-11-13 DIAGNOSIS — R350 Frequency of micturition: Secondary | ICD-10-CM | POA: Diagnosis not present

## 2023-11-13 LAB — URINALYSIS, COMPLETE
Bilirubin, UA: NEGATIVE
Glucose, UA: NEGATIVE
Ketones, UA: NEGATIVE
Nitrite, UA: NEGATIVE
Specific Gravity, UA: 1.03 (ref 1.005–1.030)
Urobilinogen, Ur: 0.2 mg/dL (ref 0.2–1.0)
pH, UA: 6 (ref 5.0–7.5)

## 2023-11-13 LAB — BLADDER SCAN AMB NON-IMAGING

## 2023-11-13 LAB — MICROSCOPIC EXAMINATION

## 2023-11-13 NOTE — Progress Notes (Signed)
 11/13/2023 2:53 PM   Jeffrey Rivers Oct 23, 1970 [id number]  CC: Chief Complaint  Patient presents with   Follow-up   HPI: Jeffrey Rivers is a 53 y.o. male with PMH BPH s/p HOLEP with Dr. Estanislao Rivers on 09/25/2023 who presents today for evaluation of ongoing voiding symptoms.   I saw him in clinic 3 weeks ago with reports of urgency, terminal dysuria, and pain/pressure in the perineum. His UA was positive and I started him on empiric Bactrim , but I switched it to Levaquin  when his culture grew Viridans strep.  Today he reports he took 12 days of Levaquin  but his symptoms never changed. He describes urethral pressure. No fevers.  In-office UA today positive for 1+ protein, 2+ blood, and trace leukocytes; urine microscopy with 11-30 WBCs/HPF, 11-30 RBCs/HPF, and moderate bacteria. PVR 0mL.  PMH: Past Medical History:  Diagnosis Date   Benign prostatic hyperplasia with urinary obstruction 08/2023   Class 2 severe obesity with serious comorbidity in adult [facility])    Essential hypertension, benign 11/05/2015   Hyperlipidemia    Pre-diabetes     Surgical History: Past Surgical History:  Procedure Laterality Date   APPENDECTOMY  1987   COLONOSCOPY WITH PROPOFOL  N/A 08/13/2021   Procedure: COLONOSCOPY WITH PROPOFOL ;  Surgeon: Jeffrey Salaam, MD;  Location: [facility] ENDOSCOPY;  Service: Gastroenterology;  Laterality: N/A;   HOLEP-LASER ENUCLEATION OF THE PROSTATE WITH MORCELLATION N/A 09/25/2023   Procedure: ENUCLEATION, PROSTATE, USING LASER, WITH MORCELLATION;  Surgeon: Jeffrey Pressman, MD;  Location: [facility] ORS;  Service: Urology;  Laterality: N/A;   INGUINAL HERNIA REPAIR Right 06/05/2014    Home Medications:  Allergies as of 11/13/2023   No Known Allergies      Medication List        Accurate as of Nov 13, 2023  2:53 PM. If you have any questions, ask your nurse or doctor.          amLODipine  10 MG tablet Commonly known as: NORVASC  TAKE 1 TABLET(10 MG) BY MOUTH DAILY    levofloxacin  500 MG tablet Commonly known as: LEVAQUIN  Take 1 tablet (500 mg total) by mouth daily for 14 days.   lisinopril  10 MG tablet Commonly known as: ZESTRIL  Take 1 tablet (10 mg total) by mouth daily.   rosuvastatin  5 MG tablet Commonly known as: Crestor  Take 1 tablet (5 mg total) by mouth daily.   sulfamethoxazole -trimethoprim  800-160 MG tablet Commonly known as: BACTRIM  DS Take 1 tablet by mouth 2 (two) times daily for 28 days.   Wegovy  0.5 MG/0.5ML Soaj Generic drug: Semaglutide -Weight Management Inject 0.5 mg into the skin once a week.        Allergies:  No Known Allergies  Family History: Family History  Problem Relation Age of Onset   Cancer Father 24       colon   Prostate cancer Neg Hx    Kidney cancer Neg Hx     Social History:   reports that he has never smoked. He has never been exposed to tobacco smoke. He has never used smokeless tobacco. He reports that he does not drink alcohol and does not use drugs.  Physical Exam: BP 132/87   Pulse 73   Ht 5\' 10"  (1.778 m)   Wt 257 lb 12.8 oz (116.9 kg)   BMI 36.99 kg/m   Constitutional:  Alert and oriented, no acute distress, nontoxic appearing HEENT: Jeffrey Rivers, AT Cardiovascular: No clubbing, cyanosis, or edema Respiratory: Normal respiratory effort, no increased work of breathing Skin: No  rashes, bruises or suspicious lesions Neurologic: Grossly intact, no focal deficits, moving all 4 extremities Psychiatric: Normal mood and affect  Laboratory Data: Results for orders placed or performed in visit on 11/13/23  Microscopic Examination   Collection Time: 11/13/23  2:38 PM   Urine  Result Value Ref Range   WBC, UA 11-30 (A) 0 - 5 /hpf   RBC, Urine 11-30 (A) 0 - 2 /hpf   Epithelial Cells (non renal) 0-10 0 - 10 /hpf   Bacteria, UA Moderate (A) None seen/Few  Urinalysis, Complete   Collection Time: 11/13/23  2:38 PM  Result Value Ref Range   Specific Gravity, UA 1.030 1.005 - 1.030   pH, UA 6.0 5.0  - 7.5   Color, UA Yellow Yellow   Appearance Ur Clear Clear   Leukocytes,UA Trace (A) Negative   Protein,UA 1+ (A) Negative/Trace   Glucose, UA Negative Negative   Ketones, UA Negative Negative   RBC, UA 2+ (A) Negative   Bilirubin, UA Negative Negative   Urobilinogen, Ur 0.2 0.2 - 1.0 mg/dL   Nitrite, UA Negative Negative   Microscopic Examination See below:   BLADDER SCAN AMB NON-IMAGING   Collection Time: 11/13/23  2:52 PM  Result Value Ref Range   Scan Result 0ml   CULTURE, URINE COMPREHENSIVE   Collection Time: 11/13/23  3:39 PM   Specimen: Urine   UR  Result Value Ref Range   Urine Culture, Comprehensive Preliminary report    Organism ID, Bacteria Comment    Urethral Sounding  Due to postop voiding symptoms patient is present today for urethral sounding.  Patient was cleaned and prepped in a sterile fashion with betadine and 2% lidocaine  jelly was instilled into the urethra. A 24 FR Silastic foley catheter was inserted to a depth of approximately 8cm; significant resistance was met just inside the meatus and at a depth of approximately 2-3cm. The catheter was able to advance past these points of resistance with some force and I felt a notable pop with this. Catheter was retracted and readvanced with minimal difficulty and patient noted improvement in insertion at that point. The catheter was then removed and scant bleeding from the meatus was noted. Patient tolerated well.  Performed by: Jeffrey Calame, PA-C   Assessment & Plan:   1. Urinary frequency (Primary) We discussed that I anticipate urgency/frequency/leakage this close following HOLEP, however his discomfort is not normal. 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 I asked him to send me a MyChart message early next week to tell me if his symptoms have improved. If not, cysto will be the next step. He may complete the last 2 days of Levaquin . - BLADDER SCAN AMB NON-IMAGING - Urinalysis, Complete - CULTURE, URINE COMPREHENSIVE   Return for Patient to contact me via MyChart with results next week.  Kathreen Pare, PA-C  [facility] [place] [address] [place], [place] [address] [phone]

## 2023-11-17 ENCOUNTER — Telehealth: Payer: Self-pay | Admitting: *Deleted

## 2023-11-17 LAB — CULTURE, URINE COMPREHENSIVE

## 2023-11-17 NOTE — Telephone Encounter (Signed)
 Patient called in today and states he is still have pain and pressure in his rectal area. He states his symptoms has not changed since his last visit with you. He states he can't send a my chart message.  Please advise next step . No fever or chills.

## 2023-11-19 NOTE — Telephone Encounter (Signed)
 Please schedule him for cystoscopy with Dr. Estanislao Heimlich, soonest available.

## 2023-11-20 NOTE — Telephone Encounter (Signed)
 LVM for pt to return call

## 2023-11-23 NOTE — Telephone Encounter (Signed)
 Cysto scheduled for 12/16/23 (first available date) with Dr. Estanislao Heimlich in [place]. Patient said he is still dealing with pain daily, and is asking what can be done in the meantime to help him deal with it. Please advise patient.

## 2023-11-25 ENCOUNTER — Other Ambulatory Visit: Payer: Self-pay | Admitting: Internal Medicine

## 2023-11-25 DIAGNOSIS — I10 Essential (primary) hypertension: Secondary | ICD-10-CM

## 2023-11-26 NOTE — Telephone Encounter (Signed)
 Rx refused  - requested too soon.

## 2023-12-16 ENCOUNTER — Other Ambulatory Visit
Admission: RE | Admit: 2023-12-16 | Discharge: 2023-12-16 | Disposition: A | Discharge status: Home / Self Care | Attending: Urology | Admitting: Urology

## 2023-12-16 ENCOUNTER — Ambulatory Visit (INDEPENDENT_AMBULATORY_CARE_PROVIDER_SITE_OTHER): Admitting: Urology

## 2023-12-16 VITALS — BP 148/91 | HR 66 | Ht 70.0 in | Wt 253.0 lb

## 2023-12-16 DIAGNOSIS — R350 Frequency of micturition: Secondary | ICD-10-CM

## 2023-12-16 DIAGNOSIS — R300 Dysuria: Secondary | ICD-10-CM | POA: Diagnosis not present

## 2023-12-16 LAB — URINALYSIS, COMPLETE (UACMP) WITH MICROSCOPIC
Bacteria, UA: NONE SEEN
Bilirubin Urine: NEGATIVE
Glucose, UA: NEGATIVE mg/dL
Ketones, ur: NEGATIVE mg/dL
Leukocytes,Ua: NEGATIVE
Nitrite: NEGATIVE
Protein, ur: NEGATIVE mg/dL
RBC / HPF: >50 RBC/hpf (ref 0–5)
Specific Gravity, Urine: 1.015 (ref 1.005–1.030)
Squamous Epithelial / HPF: NONE SEEN /HPF (ref 0–5)
WBC, UA: NONE SEEN WBC/hpf (ref 0–5)
pH: 5.5 (ref 5.0–8.0)

## 2023-12-16 MED ORDER — MIRABEGRON ER 25 MG PO TB24: 25.0000 mg | ORAL_TABLET | Freq: Every day | ORAL | Status: DC

## 2023-12-16 MED ORDER — DOXYCYCLINE HYCLATE 100 MG PO CAPS
100.0000 mg | ORAL_CAPSULE | Freq: Two times a day (BID) | ORAL | 0 refills | Status: DC
Start: 2023-12-16 — End: 2024-01-15

## 2023-12-16 MED ORDER — LIDOCAINE HCL URETHRAL/MUCOSAL 2 % EX GEL
1.0000 | Freq: Once | CUTANEOUS | Status: AC
  Administered 2023-12-16: 1 via URETHRAL

## 2023-12-16 MED ORDER — CELECOXIB 200 MG PO CAPS: 200.0000 mg | ORAL_CAPSULE | Freq: Two times a day (BID) | ORAL | 0 refills | Status: AC

## 2023-12-16 NOTE — Progress Notes (Signed)
 Cystoscopy Procedure Note:  Indication: Irritative urinary symptoms  Status post HOLEP 09/25/2023 with removal of 32 g benign tissue.  Primary preop symptoms were weak stream.  He has had persistent dysuria, pelvic discomfort, urgency since that time.  He was trialed on a course of antibiotics by Samantha Vaillancourt, he has been taking ibuprofen which he finds helpful.  He was set up for cystoscopy for further evaluation.  After informed consent and discussion of the procedure and its risks, Jeffrey Rivers was positioned and prepped in the standard fashion. Cystoscopy was performed with a flexible cystoscope. The urethra, bladder neck and entire bladder was visualized in a standard fashion. The prostatic fossa was open consistent with prior HOLEP.  There was some mild bullous edema at the bladder neck.  No abnormalities within the bladder, mucosa normal throughout.  No abnormalities on retroflexion.  The ureteral orifices were visualized in their normal location and orientation.  Urethra grossly normal throughout, no evidence of stricture.  Findings: Essentially normal cystoscopy, mild inflammation of bladder neck, wide open prostatic fossa, normal urethra  ------------------------------------------------------------------------------------------  Assessment and Plan: 53 year old male with persistent irritative urinary symptoms after HOLEP on 09/25/2023.  Cystoscopy essentially benign today.  Possible etiologies include residual inflammation/healing, infection, pelvic floor dysfunction, standard postop adjustment after long history of obstruction.  I recommended a trial of doxycycline and Celebrex, as well as samples of Myrbetriq x 1 month to see if he gets any improvement Reassurance provided regarding normal anatomy RTC 4 to 6 weeks symptom check  Redell Burnet, MD 12/16/2023

## 2023-12-16 NOTE — Patient Instructions (Signed)
 You can take Azo(Pyridium ) over-the-counter for burning with urination.  Do not take this longer than 7 days.  Avoid diet drinks, carbonated drinks, spicy foods, as these can make your urinary symptoms worse

## 2023-12-17 LAB — URINE CULTURE: Culture: NO GROWTH

## 2024-01-15 ENCOUNTER — Ambulatory Visit: Admitting: Internal Medicine

## 2024-01-15 ENCOUNTER — Encounter: Payer: Self-pay | Admitting: Internal Medicine

## 2024-01-15 VITALS — BP 124/82 | HR 68 | Temp 97.8°F | Resp 16 | Ht 70.0 in | Wt 255.6 lb

## 2024-01-15 DIAGNOSIS — I10 Essential (primary) hypertension: Secondary | ICD-10-CM | POA: Diagnosis not present

## 2024-01-15 DIAGNOSIS — E66812 Obesity, class 2: Secondary | ICD-10-CM | POA: Diagnosis not present

## 2024-01-15 DIAGNOSIS — Z6837 Body mass index (BMI) 37.0-37.9, adult: Secondary | ICD-10-CM

## 2024-01-15 DIAGNOSIS — E782 Mixed hyperlipidemia: Secondary | ICD-10-CM | POA: Diagnosis not present

## 2024-01-15 DIAGNOSIS — R7303 Prediabetes: Secondary | ICD-10-CM

## 2024-01-15 LAB — POCT GLYCOSYLATED HEMOGLOBIN (HGB A1C): Hemoglobin A1C: 5.8 % — AB (ref 4.0–5.6)

## 2024-01-15 MED ORDER — ROSUVASTATIN CALCIUM 5 MG PO TABS: 5.0000 mg | ORAL_TABLET | Freq: Every day | ORAL | 1 refills | Status: AC

## 2024-01-15 MED ORDER — LISINOPRIL 10 MG PO TABS: 10.0000 mg | ORAL_TABLET | Freq: Every day | ORAL | 1 refills | Status: AC

## 2024-01-15 MED ORDER — WEGOVY 0.5 MG/0.5ML ~~LOC~~ SOAJ
0.5000 mg | SUBCUTANEOUS | 2 refills | Status: DC
Start: 2024-01-15 — End: 2024-04-18

## 2024-01-15 MED ORDER — AMLODIPINE BESYLATE 10 MG PO TABS: ORAL_TABLET | ORAL | 1 refills | Status: AC

## 2024-01-15 NOTE — Progress Notes (Signed)
 Established Patient Office Visit  Subjective   Patient ID: ZIMRI BRENNEN, male    DOB: Jan 21, 1971  Age: 53 y.o. MRN: [id number]  Chief Complaint  Patient presents with   Medical Management of Chronic Issues    HPI  Develle Sievers Eliasen presents for follow up on chronic medical conditions.   Discussed the use of AI scribe software for clinical note transcription with the patient, who gave verbal consent to proceed.  History of Present Illness KAMARE CASPERS is a 53 year old male with hypertension and hyperlipidemia who presents for medication management and follow-up.  He is currently out of lisinopril  and unsure if he is taking amlodipine  for hypertension. He is also out of Crestor  for hyperlipidemia due to a refill issue. He has not taken Wegovy  0.5 mg for weight management for a month due to refill difficulties, after being on it for two to three months.  He experiences nocturia, urinating three to four times a night, and is not taking Flomax  or any other medication for this issue. His urologist advised against tamsulosin .  He consumes Celsius energy drinks two to three times a week, usually in the morning or after walking. His exercise routine varies weekly, influenced by his work schedule and weather conditions.   Hypertension: -Medications: Lisinopril  10 mg, Amlodipine  10 mg -Patient is mostly compliant with above medications and reports no side effects generally. Sometimes forgets to take medications so he is taking them all at once at night, which sometime causes dizziness, fatigue. -Checking BP at home (average): not checking -Denies any SOB, CP, vision changes, LE edema  HLD: -Medications: Crestor  5 mg - not taking currently, didn't have it available to pick up at pharmacy  -Last lipid panel: Lipid Panel     Component Value Date/Time   CHOL 194 04/09/2023 1518   CHOL 217 (H) 11/05/2015 1518   TRIG 206 (H) 04/09/2023 1518   HDL 54 04/09/2023 1518   HDL 59 11/05/2015  1518   CHOLHDL 3.6 04/09/2023 1518   VLDL 11 02/02/2017 0838   LDLCALC 108 (H) 04/09/2023 1518   LABVLDL 12 11/05/2015 1518   The 10-year ASCVD risk score (Arnett DK, et al., 2019) is: 11.6%   Values used to calculate the score:     Age: 27 years     Clincally relevant sex: Male     Is Non-Hispanic African American: Yes     Diabetic: No     Tobacco smoker: No     Systolic Blood Pressure: 148 mmHg     Is BP treated: Yes     HDL Cholesterol: 54 mg/dL     Total Cholesterol: 194 mg/dL  Pre-Diabetes/Obesity: -Last A1c: 1/25 6.0% -Increased Wegovy  to 0.5 mg at LOV but due to a pharmacy error has been out of the medication for about 1 month -Denies side effects other than mild heart burn symptoms  BPH: -No longer on Flomax  but having more nocturia lately and increased urinary urgency  -Denies dysuria, hematuria -Prostate biopsy negative   Health Maintenance: -Blood work UTD -Colon cancer screening: colonoscopy 2/23, repeat in 5 years   Patient Active Problem List   Diagnosis Date Noted   Low testosterone  in male 06/12/2020   History of snoring 06/12/2020   Prediabetes 05/21/2018   Class 2 severe obesity with serious comorbidity and body mass index (BMI) of 37.0 to 37.9 in adult [facility]) 03/22/2016   Hyperlipidemia 03/17/2016   Hesitancy 12/19/2015   Benign prostatic hyperplasia with urinary obstruction  11/29/2015   Essential hypertension, benign 11/05/2015   LVH (left ventricular hypertrophy) 11/05/2015   Past Medical History:  Diagnosis Date   Benign prostatic hyperplasia with urinary obstruction 08/2023   Class 2 severe obesity with serious comorbidity in adult [facility])    Essential hypertension, benign 11/05/2015   Hyperlipidemia    Pre-diabetes    Past Surgical History:  Procedure Laterality Date   APPENDECTOMY  1987   COLONOSCOPY WITH PROPOFOL  N/A 08/13/2021   Procedure: COLONOSCOPY WITH PROPOFOL ;  Surgeon: Therisa Bi, MD;  Location: [facility] ENDOSCOPY;  Service:  Gastroenterology;  Laterality: N/A;   HOLEP-LASER ENUCLEATION OF THE PROSTATE WITH MORCELLATION N/A 09/25/2023   Procedure: ENUCLEATION, PROSTATE, USING LASER, WITH MORCELLATION;  Surgeon: Francisca Redell BROCKS, MD;  Location: [facility] ORS;  Service: Urology;  Laterality: N/A;   INGUINAL HERNIA REPAIR Right 06/05/2014   Social History   Tobacco Use   Smoking status: Never    Passive exposure: Never   Smokeless tobacco: Never  Vaping Use   Vaping status: Never Used  Substance Use Topics   Alcohol use: No   Drug use: No   Social History   Socioeconomic History   Marital status: Married    Spouse name: Dorothe   Number of children: 1   Years of education: Not on file   Highest education level: Not on file  Occupational History   Not on file  Tobacco Use   Smoking status: Never    Passive exposure: Never   Smokeless tobacco: Never  Vaping Use   Vaping status: Never Used  Substance and Sexual Activity   Alcohol use: No   Drug use: No   Sexual activity: Not Currently  Other Topics Concern   Not on file  Social History Narrative   Not on file   Social Drivers of Health   Financial Resource Strain: Low Risk  (08/09/2020)   Overall Financial Resource Strain (CARDIA)    Difficulty of Paying Living Expenses: Not hard at all  Food Insecurity: No Food Insecurity (08/09/2020)   Hunger Vital Sign    Worried About Running Out of Food in the Last Year: Never true    Ran Out of Food in the Last Year: Never true  Transportation Needs: No Transportation Needs (08/09/2020)   PRAPARE - Administrator, Civil Service (Medical): No    Lack of Transportation (Non-Medical): No  Physical Activity: Insufficiently Active (08/09/2020)   Exercise Vital Sign    Days of Exercise per Week: 3 days    Minutes of Exercise per Session: 40 min  Stress: Not on file (04/23/2023)  Social Connections: Socially Integrated (08/09/2020)   Social Connection and Isolation Panel    Frequency of Communication  with Friends and Family: More than three times a week    Frequency of Social Gatherings with Friends and Family: More than three times a week    Attends Religious Services: More than 4 times per year    Active Member of [employer] or Organizations: No    Attends Engineer, structural: More than 4 times per year    Marital Status: Married  Catering manager Violence: Not At Risk (08/09/2020)   Humiliation, Afraid, Rape, and Kick questionnaire    Fear of Current or Ex-Partner: No    Emotionally Abused: No    Physically Abused: No    Sexually Abused: No   Family Status  Relation Name Status   Mother  Alive   Father  Deceased   Neg Hx  (  Not Specified)  No partnership data on file   Family History  Problem Relation Age of Onset   Cancer Father 84       colon   Prostate cancer Neg Hx    Kidney cancer Neg Hx    No Known Allergies    Review of Systems  Genitourinary:  Positive for frequency and urgency. Negative for dysuria.  All other systems reviewed and are negative.     Objective:     BP 124/82 (Cuff Size: Large)   Pulse 68   Temp 97.8 F (36.6 C) (Oral)   Resp 16   Ht 5' 10 (1.778 m)   Wt 255 lb 9.6 oz (115.9 kg)   SpO2 99%   BMI 36.67 kg/m  BP Readings from Last 3 Encounters:  12/16/23 (!) 148/91  11/13/23 132/87  10/23/23 126/87   Wt Readings from Last 3 Encounters:  12/16/23 253 lb (114.8 kg)  11/13/23 257 lb 12.8 oz (116.9 kg)  10/23/23 255 lb 9.6 oz (115.9 kg)      Physical Exam Constitutional:      Appearance: Normal appearance.  HENT:     Head: Normocephalic and atraumatic.  Eyes:     Conjunctiva/sclera: Conjunctivae normal.  Cardiovascular:     Rate and Rhythm: Normal rate and regular rhythm.  Pulmonary:     Effort: Pulmonary effort is normal.     Breath sounds: Normal breath sounds.  Skin:    General: Skin is warm and dry.  Neurological:     General: No focal deficit present.     Mental Status: He is alert. Mental status is at  baseline.  Psychiatric:        Mood and Affect: Mood normal.      No results found for any visits on 01/15/24.   Last CBC Lab Results  Component Value Date   WBC 4.0 09/18/2023   HGB 14.1 09/18/2023   HCT 42.0 09/18/2023   MCV 81.7 09/18/2023   MCH 27.4 09/18/2023   RDW 13.3 09/18/2023   PLT 204 09/18/2023   Last metabolic panel Lab Results  Component Value Date   GLUCOSE 102 (H) 09/18/2023   NA 138 09/18/2023   K 3.7 09/18/2023   CL 103 09/18/2023   CO2 27 09/18/2023   BUN 18 09/18/2023   CREATININE 0.95 09/18/2023   EGFR 87 04/09/2023   CALCIUM  9.3 09/18/2023   PROT 6.7 04/09/2023   ALBUMIN 4.3 02/02/2017   BILITOT 0.3 04/09/2023   ALKPHOS 43 02/02/2017   AST 21 04/09/2023   ALT 27 04/09/2023   ANIONGAP 8 09/18/2023   Last lipids Lab Results  Component Value Date   CHOL 194 04/09/2023   HDL 54 04/09/2023   LDLCALC 108 (H) 04/09/2023   TRIG 206 (H) 04/09/2023   CHOLHDL 3.6 04/09/2023   Last hemoglobin A1c Lab Results  Component Value Date   HGBA1C 6.0 (A) 07/13/2023   Last thyroid functions Lab Results  Component Value Date   TSH 1.45 12/12/2021   Last vitamin D  Lab Results  Component Value Date   VD25OH 34 12/12/2021   Last vitamin B12 and Folate Lab Results  Component Value Date   VITAMINB12 710 02/25/2019      The 10-year ASCVD risk score (Arnett DK, et al., 2019) is: 11.6%    Assessment & Plan:   Assessment & Plan Pre-Diabetes A1c level is 5.8% today. - Perform finger stick to check A1c today. - Plan for fasting labs in three months.  Essential hypertension Blood pressure is well-controlled at 124/82 mmHg on lisinopril  and amlodipine . - Refill lisinopril  and amlodipine  prescriptions.  Hyperlipidemia Currently out of Crestor  (rosuvastatin ) due to refill issues. - Refill rosuvastatin  prescription. - Plan to recheck fasting labs at follow up.   Obesity Previously on Wegovy  0.5 mg but has been off for a month due to refill  issues. Discussed potential nausea with restarting 0.5 mg dose. - Refill Wegovy  prescription at 0.5 mg dose. - Monitor weight weekly for three months. - Reassess in three months to determine if dose adjustment is needed.  Nocturia Experiencing nocturia, possibly related to bladder strength post-procedure. Discussed saw palmetto as a supplement option. - Consider restarting Flomax  to reduce nocturia. - Discuss saw palmetto supplement as an option for nocturia management. - Advise reducing fluid intake 3-4 hours before bedtime.  - POCT HgB A1C - Semaglutide -Weight Management (WEGOVY ) 0.5 MG/0.5ML SOAJ; Inject 0.5 mg into the skin once a week.  Dispense: 2 mL; Refill: 2 - amLODipine  (NORVASC ) 10 MG tablet; TAKE 1 TABLET(10 MG) BY MOUTH DAILY  Dispense: 90 tablet; Refill: 1 - lisinopril  (ZESTRIL ) 10 MG tablet; Take 1 tablet (10 mg total) by mouth daily.  Dispense: 90 tablet; Refill: 1 - rosuvastatin  (CRESTOR ) 5 MG tablet; Take 1 tablet (5 mg total) by mouth daily.  Dispense: 90 tablet; Refill: 1 - Semaglutide -Weight Management (WEGOVY ) 0.5 MG/0.5ML SOAJ; Inject 0.5 mg into the skin once a week.  Dispense: 2 mL; Refill: 2   Return in about 3 months (around 04/16/2024).    Sharyle Fischer, DO

## 2024-01-20 ENCOUNTER — Ambulatory Visit: Admitting: Urology

## 2024-01-28 ENCOUNTER — Ambulatory Visit: Admitting: Urology

## 2024-02-03 ENCOUNTER — Ambulatory Visit (INDEPENDENT_AMBULATORY_CARE_PROVIDER_SITE_OTHER): Admitting: Urology

## 2024-02-03 ENCOUNTER — Encounter: Payer: Self-pay | Admitting: Urology

## 2024-02-03 VITALS — BP 133/86 | HR 62 | Ht 70.0 in | Wt 250.0 lb

## 2024-02-03 DIAGNOSIS — R399 Unspecified symptoms and signs involving the genitourinary system: Secondary | ICD-10-CM | POA: Diagnosis not present

## 2024-02-03 DIAGNOSIS — R339 Retention of urine, unspecified: Secondary | ICD-10-CM | POA: Diagnosis not present

## 2024-02-03 LAB — BLADDER SCAN AMB NON-IMAGING

## 2024-02-03 NOTE — Patient Instructions (Signed)
 I would recommend a sleep apnea study, this is a very common cause of urination overnight and can completely resolve with a CPAP machine if you do have sleep apnea.  Nocturia refers to the need to wake up during the night to urinate, which can disrupt your sleep and impact your overall well-being. Fortunately, there are several strategies you can employ to help prevent or manage nocturia. It's important to consult with your healthcare provider before making any significant changes to your routine. Here are some helpful strategies to consider:  Limit Fluid Intake Before Bed: Avoid drinking large amounts of fluids in the evening, especially within a few hours of bedtime. Consume most of your daily fluid intake earlier in the day to reduce the need to urinate at night.  Monitor Your Diet: Limit your intake of caffeine and alcohol, as these substances can increase urine production and irritate the bladder.  Avoid diet, zero calorie, and artificially sweetened drinks, especially sodas, in the afternoon or evening. Be mindful of consuming foods and drinks with high water content before bedtime, such as watermelon and herbal teas.  Time Your Medications: If you're taking medications that contribute to increased urination, consult your healthcare provider about adjusting the timing of these medications to minimize their impact during the night.  Practice Double Voiding: Before going to bed, make an effort to empty your bladder twice within a short period. This can help reduce the amount of urine left in your bladder before sleep.  Bladder Training: Gradually increase the time between bathroom visits during the day to train your bladder to hold larger volumes of urine. Over time, this can help reduce the frequency of nighttime awakenings to urinate.  Elevate Your Legs During the Day: Elevating your legs during the day can help minimize fluid retention in your lower extremities, which might reduce  nighttime urination.  Pelvic Floor Exercises: Strengthening your pelvic floor muscles through Kegel exercises can help improve bladder control and potentially reduce the urge to urinate at night.  Create a Relaxing Bedtime Routine: Stress and anxiety can exacerbate nocturia. Engage in calming activities before bed, such as reading, listening to soothing music, or practicing relaxation techniques.  Stay Active: Engage in regular physical activity, but avoid intense exercise close to bedtime, as this can increase your body's demand for fluids.  Maintain a Healthy Weight: Excess weight can compress the bladder and contribute to bladder and urinary issues. Aim to achieve and maintain a healthy weight through a balanced diet and regular exercise.  Remember that every individual is unique, and the effectiveness of these strategies may vary. It's important to work with your healthcare provider to develop a plan that suits your specific needs and addresses any underlying causes of nocturia.

## 2024-02-03 NOTE — Progress Notes (Signed)
   02/03/2024 3:12 PM   Jeffrey Rivers 1971/03/19 [id number]  Reason for visit: Follow up BPH/LUTS/nocturia status post HOLEP  HPI: 53 year old male with obstructive urinary symptoms of weak stream and nocturia, underwent uncomplicated HOLEP in April 2025 with removal of 32 g of benign tissue.  He was reporting persistent dysuria, pelvic discomfort, and urgency since surgery and was set up for a cystoscopy in July 2025.  This was benign with an open prostatic fossa, we opted for trial of doxycycline , Celebrex , and 1 month of Myrbetriq  samples.  He reports complete resolution of his symptoms over the last month.  Denies any pain, pressure, or dysuria.  No problems with urination, rare urgency.  Nocturia 2-3 times per night.  Has not been tested for sleep apnea.  No incontinence.  PVR today normal at 1ml.  Consumes a fair amount of diet drinks and energy drinks during the day, recommended decreasing intake of bladder irritants.  Recommended sleep apnea evaluation Behavioral strategies discussed regarding bladder irritants RTC 9 months PVR  Jeffrey JAYSON Burnet, MD  [facility] [address] [place], [place] [address] [phone]

## 2024-02-05 ENCOUNTER — Telehealth: Payer: Self-pay

## 2024-02-05 ENCOUNTER — Telehealth: Payer: Self-pay | Admitting: Pharmacy Technician

## 2024-02-05 ENCOUNTER — Other Ambulatory Visit (HOSPITAL_COMMUNITY): Payer: Self-pay

## 2024-02-05 NOTE — Telephone Encounter (Signed)
 Insurance companies are becoming increasingly stricter about requiring thorough documentation of lifestyle modifications in the patient's chart at each visit. This includes detailed records of diet recommendations (caloric intake, etc), exercise plans (amount of time/wk, etc), and an emphasis on the patient's commitment to continuing these efforts while on medication.  Without this additional documentation in the chart notes, a prior authorization will most likely be denied.     I have searched his chart over and can't find that he has lost greater than or equal to 5% of baseline body weight. Wegovy  0.25 mg was first dispensed 06/22/2023 and his weight was 266 lbs on 06/23/2023 and today his weight is currently 255 lbs. Is there a medical reason he hasn't lost the required 5% of baseline weight that I can submit with this PA request??

## 2024-02-05 NOTE — Telephone Encounter (Signed)
 Left detailed vm

## 2024-02-05 NOTE — Telephone Encounter (Signed)
 PA request has been Started. New Encounter has been or will be created for follow up. For additional info see Pharmacy Prior Auth telephone encounter from 02/05/24.

## 2024-02-05 NOTE — Telephone Encounter (Signed)
 FYI pt does not qualify has not not lost 5%of weight since starting in jan.

## 2024-02-05 NOTE — Telephone Encounter (Signed)
 Copied from CRM [id number]. Topic: Clinical - Prescription Issue >> Feb 05, 2024  9:11 AM Willma SAUNDERS wrote: Reason for CRM: Patient states went to the pharmacy to get his prescription for Semaglutide -Weight Management (WEGOVY ) 0.5 MG/0.5ML SOAJ and they advised his insurance no longer covers this prescription. Would like to discuss an alternative.  Patient can be reached at [phone]

## 2024-02-08 ENCOUNTER — Other Ambulatory Visit (HOSPITAL_COMMUNITY): Payer: Self-pay

## 2024-02-12 ENCOUNTER — Ambulatory Visit: Payer: Self-pay | Admitting: Internal Medicine

## 2024-02-12 NOTE — Telephone Encounter (Signed)
 Called and lvm notifying

## 2024-02-12 NOTE — Telephone Encounter (Signed)
 ccCopied from CRM #[id number]. Topic: Clinical - Medication Question >> Feb 12, 2024 10:50 AM Willma SAUNDERS wrote: Reason for CRM: Patient prior auth request for prescription Semaglutide -Weight Management (WEGOVY ) 0.5 MG/0.5ML SOAJ was denied based on their weight loss requirement.  Patient is inquiring if there is any other medication he can try.  Patient can be reached at [phone]

## 2024-02-19 ENCOUNTER — Other Ambulatory Visit (HOSPITAL_COMMUNITY): Payer: Self-pay

## 2024-03-07 ENCOUNTER — Ambulatory Visit: Payer: Self-pay | Admitting: Urology

## 2024-04-18 ENCOUNTER — Encounter: Payer: Self-pay | Admitting: Internal Medicine

## 2024-04-18 ENCOUNTER — Ambulatory Visit (INDEPENDENT_AMBULATORY_CARE_PROVIDER_SITE_OTHER): Admitting: Internal Medicine

## 2024-04-18 ENCOUNTER — Other Ambulatory Visit: Payer: Self-pay

## 2024-04-18 VITALS — BP 124/82 | HR 91 | Temp 97.9°F | Resp 16 | Ht 70.0 in | Wt 258.1 lb

## 2024-04-18 DIAGNOSIS — E782 Mixed hyperlipidemia: Secondary | ICD-10-CM | POA: Diagnosis not present

## 2024-04-18 DIAGNOSIS — I10 Essential (primary) hypertension: Secondary | ICD-10-CM

## 2024-04-18 DIAGNOSIS — R7303 Prediabetes: Secondary | ICD-10-CM

## 2024-04-18 NOTE — Patient Instructions (Signed)
Pneumococcal Conjugate Vaccine (PCV20) Injection What is this medication? PNEUMOCOCCAL CONJUGATE VACCINE (NEU mo KOK al kon ju gate vak SEEN) reduces the risk of pneumococcal disease, such as pneumonia. It does not treat pneumococcal disease. It is still possible to get pneumococcal disease after receiving this vaccine, but the symptoms may be less severe or not last as long. It works by helping your immune system learn how to fight off a future infection. This medicine may be used for other purposes; ask your health care provider or pharmacist if you have questions. COMMON BRAND NAME(S): Prevnar 20 What should I tell my care team before I take this medication? They need to know if you have any of these conditions: Bleeding disorder Fever Immune system problems An unusual or allergic reaction to pneumococcal vaccine, diphtheria toxoid, other vaccines, other medications, foods, dyes, or preservatives Pregnant or trying to get pregnant Breastfeeding How should I use this medication? This vaccine is injected into a muscle. It is given by your care team. A copy of Vaccine Information Statements will be given before each vaccination. Be sure to read this information carefully each time. This sheet may change often. Talk to your care team about the use of this medication in children. While it may be given to children as young as 6 weeks for selected conditions, precautions do apply. Overdosage: If you think you have taken too much of this medicine contact a poison control center or emergency room at once. NOTE: This medicine is only for you. Do not share this medicine with others. What if I miss a dose? This does not apply. This medication is not for regular use. What may interact with this medication? Medications for cancer chemotherapy Medications that suppress your immune function Steroid medications, such as prednisone or cortisone This list may not describe all possible interactions. Give  your health care provider a list of all the medicines, herbs, non-prescription drugs, or dietary supplements you use. Also tell them if you smoke, drink alcohol, or use illegal drugs. Some items may interact with your medicine. What should I watch for while using this medication? Visit your care team regularly. Report any side effects to your care team right away. This vaccine, like all vaccines, may not fully protect everyone. What side effects may I notice from receiving this medication? Side effects that you should report to your care team as soon as possible: Allergic reactions--skin rash, itching, hives, swelling of the face, lips, tongue, or throat Side effects that usually do not require medical attention (report these to your care team if they continue or are bothersome): Fatigue Fever Headache Joint pain Muscle pain Pain, redness, or irritation at injection site This list may not describe all possible side effects. Call your doctor for medical advice about side effects. You may report side effects to FDA at 1-800-FDA-1088. Where should I keep my medication? This vaccine is only given by your care team. It will not be stored at home. NOTE: This sheet is a summary. It may not cover all possible information. If you have questions about this medicine, talk to your doctor, pharmacist, or health care provider.  2024 Elsevier/Gold Standard (2021-11-13 00:00:00)

## 2024-04-18 NOTE — Progress Notes (Signed)
 Established Patient Office Visit  Subjective   Patient ID: Jeffrey Rivers, male    DOB: 1971/04/26  Age: 53 y.o. MRN: [id number]  Chief Complaint  Patient presents with   Medical Management of Chronic Issues    HPI  Jeffrey Rivers presents for follow up on chronic medical conditions.   Discussed the use of AI scribe software for clinical note transcription with the patient, who gave verbal consent to proceed.  History of Present Illness  Jeffrey Rivers is a 53 year old male who presents with concerns about weight management and insurance coverage for Wegovy .  He has been using Wegovy  for weight management, which was effective until his insurance, [employer], stopped covering it. His weight decreased from 266 pounds last year to 250 pounds in August but has since increased to 258 pounds. He has not contacted the insurance company regarding the discontinuation of coverage. He sometimes forgets to take his medication as prescribed and is frustrated with the cost of the medication without insurance, estimated at $499 per month with a savings program.  He exercises at least three times a week, primarily walking and some weightlifting. He focuses on a healthy diet, including high-protein breakfasts and avoiding high-carb foods. He has prediabetes.   Hypertension: -Medications: Lisinopril  10 mg, Amlodipine  10 mg -Patient is mostly compliant with above medications and reports no side effects generally. Sometimes forgets to take medications so he is taking them all at once at night, which sometime causes dizziness, fatigue. -Checking BP at home (average): not checking -Denies any SOB, CP, vision changes, LE edema  HLD: -Medications: Crestor  5 mg -Last lipid panel: Lipid Panel     Component Value Date/Time   CHOL 194 04/09/2023 1518   CHOL 217 (H) 11/05/2015 1518   TRIG 206 (H) 04/09/2023 1518   HDL 54 04/09/2023 1518   HDL 59 11/05/2015 1518   CHOLHDL 3.6 04/09/2023  1518   VLDL 11 02/02/2017 0838   LDLCALC 108 (H) 04/09/2023 1518   LABVLDL 12 11/05/2015 1518   The 10-year ASCVD risk score (Arnett DK, et al., 2019) is: 8.4%   Values used to calculate the score:     Age: 49 years     Clincally relevant sex: Male     Is Non-Hispanic African American: Yes     Diabetic: No     Tobacco smoker: No     Systolic Blood Pressure: 124 mmHg     Is BP treated: Yes     HDL Cholesterol: 54 mg/dL     Total Cholesterol: 194 mg/dL  Pre-Diabetes/Obesity: -Last A1c: 5.8% 8/25 -Increased Wegovy  to 0.5 mg at LOV but just found out that insurance will no longer cover -Denies side effects other than mild heart burn symptoms  BPH: -No longer on Flomax  but having more nocturia lately and increased urinary urgency  -Denies dysuria, hematuria -Prostate biopsy negative   Health Maintenance: -Blood work due -Colon cancer screening: colonoscopy 2/23, repeat in 5 years  -Declines flu and Prevnar 20 today but info printed   Patient Active Problem List   Diagnosis Date Noted   Low testosterone  in male 06/12/2020   History of snoring 06/12/2020   Prediabetes 05/21/2018   Class 2 severe obesity with serious comorbidity and body mass index (BMI) of 37.0 to 37.9 in adult 03/22/2016   Hyperlipidemia 03/17/2016   Hesitancy 12/19/2015   Benign prostatic hyperplasia with urinary obstruction 11/29/2015   Essential hypertension, benign 11/05/2015   LVH (left  ventricular hypertrophy) 11/05/2015   Past Medical History:  Diagnosis Date   Benign prostatic hyperplasia with urinary obstruction 08/2023   Class 2 severe obesity with serious comorbidity in adult    Essential hypertension, benign 11/05/2015   Hyperlipidemia    Pre-diabetes    Past Surgical History:  Procedure Laterality Date   APPENDECTOMY  1987   COLONOSCOPY WITH PROPOFOL  N/A 08/13/2021   Procedure: COLONOSCOPY WITH PROPOFOL ;  Surgeon: Therisa Bi, MD;  Location: [facility] ENDOSCOPY;  Service: Gastroenterology;   Laterality: N/A;   HOLEP-LASER ENUCLEATION OF THE PROSTATE WITH MORCELLATION N/A 09/25/2023   Procedure: ENUCLEATION, PROSTATE, USING LASER, WITH MORCELLATION;  Surgeon: Francisca Redell BROCKS, MD;  Location: [facility] ORS;  Service: Urology;  Laterality: N/A;   INGUINAL HERNIA REPAIR Right 06/05/2014   Social History   Tobacco Use   Smoking status: Never    Passive exposure: Never   Smokeless tobacco: Never  Vaping Use   Vaping status: Never Used  Substance Use Topics   Alcohol use: No   Drug use: No   Social History   Socioeconomic History   Marital status: Married    Spouse name: Jeffrey Rivers   Number of children: 1   Years of education: Not on file   Highest education level: Not on file  Occupational History   Not on file  Tobacco Use   Smoking status: Never    Passive exposure: Never   Smokeless tobacco: Never  Vaping Use   Vaping status: Never Used  Substance and Sexual Activity   Alcohol use: No   Drug use: No   Sexual activity: Not Currently  Other Topics Concern   Not on file  Social History Narrative   Not on file   Social Drivers of Health   Financial Resource Strain: Low Risk  (08/09/2020)   Overall Financial Resource Strain (CARDIA)    Difficulty of Paying Living Expenses: Not hard at all  Food Insecurity: No Food Insecurity (08/09/2020)   Hunger Vital Sign    Worried About Running Out of Food in the Last Year: Never true    Ran Out of Food in the Last Year: Never true  Transportation Needs: No Transportation Needs (08/09/2020)   PRAPARE - Administrator, Civil Service (Medical): No    Lack of Transportation (Non-Medical): No  Physical Activity: Insufficiently Active (08/09/2020)   Exercise Vital Sign    Days of Exercise per Week: 3 days    Minutes of Exercise per Session: 40 min  Stress: Not on file (04/23/2023)  Social Connections: Socially Integrated (08/09/2020)   Social Connection and Isolation Panel    Frequency of Communication with Friends and  Family: More than three times a week    Frequency of Social Gatherings with Friends and Family: More than three times a week    Attends Religious Services: More than 4 times per year    Active Member of [employer] or Organizations: No    Attends Engineer, Structural: More than 4 times per year    Marital Status: Married  Catering Manager Violence: Not At Risk (08/09/2020)   Humiliation, Afraid, Rape, and Kick questionnaire    Fear of Current or Ex-Partner: No    Emotionally Abused: No    Physically Abused: No    Sexually Abused: No   Family Status  Relation Name Status   Mother  Alive   Father  Deceased   Neg Hx  (Not Specified)  No partnership data on file   Family  History  Problem Relation Age of Onset   Cancer Father 30       colon   Prostate cancer Neg Hx    Kidney cancer Neg Hx    No Known Allergies    Review of Systems  All other systems reviewed and are negative.     Objective:     BP 124/82 (Cuff Size: Large)   Pulse 91   Temp 97.9 F (36.6 C) (Oral)   Resp 16   Ht 5' 10 (1.778 m)   Wt 258 lb 1.6 oz (117.1 kg)   SpO2 99%   BMI 37.03 kg/m  BP Readings from Last 3 Encounters:  04/18/24 124/82  02/03/24 133/86  01/15/24 124/82   Wt Readings from Last 3 Encounters:  04/18/24 258 lb 1.6 oz (117.1 kg)  02/03/24 250 lb (113.4 kg)  01/15/24 255 lb 9.6 oz (115.9 kg)      Physical Exam Constitutional:      Appearance: Normal appearance.  HENT:     Head: Normocephalic and atraumatic.  Eyes:     Conjunctiva/sclera: Conjunctivae normal.  Cardiovascular:     Rate and Rhythm: Normal rate and regular rhythm.  Pulmonary:     Effort: Pulmonary effort is normal.     Breath sounds: Normal breath sounds.  Skin:    General: Skin is warm and dry.  Neurological:     General: No focal deficit present.     Mental Status: He is alert. Mental status is at baseline.  Psychiatric:        Mood and Affect: Mood normal.      No results found for any  visits on 04/18/24.   Last CBC Lab Results  Component Value Date   WBC 4.0 09/18/2023   HGB 14.1 09/18/2023   HCT 42.0 09/18/2023   MCV 81.7 09/18/2023   MCH 27.4 09/18/2023   RDW 13.3 09/18/2023   PLT 204 09/18/2023   Last metabolic panel Lab Results  Component Value Date   GLUCOSE 102 (H) 09/18/2023   NA 138 09/18/2023   K 3.7 09/18/2023   CL 103 09/18/2023   CO2 27 09/18/2023   BUN 18 09/18/2023   CREATININE 0.95 09/18/2023   EGFR 87 04/09/2023   CALCIUM  9.3 09/18/2023   PROT 6.7 04/09/2023   ALBUMIN 4.3 02/02/2017   BILITOT 0.3 04/09/2023   ALKPHOS 43 02/02/2017   AST 21 04/09/2023   ALT 27 04/09/2023   ANIONGAP 8 09/18/2023   Last lipids Lab Results  Component Value Date   CHOL 194 04/09/2023   HDL 54 04/09/2023   LDLCALC 108 (H) 04/09/2023   TRIG 206 (H) 04/09/2023   CHOLHDL 3.6 04/09/2023   Last hemoglobin A1c Lab Results  Component Value Date   HGBA1C 5.8 (A) 01/15/2024   Last thyroid functions Lab Results  Component Value Date   TSH 1.45 12/12/2021   Last vitamin D  Lab Results  Component Value Date   VD25OH 34 12/12/2021   Last vitamin B12 and Folate Lab Results  Component Value Date   VITAMINB12 710 02/25/2019      The 10-year ASCVD risk score (Arnett DK, et al., 2019) is: 8.4%    Assessment & Plan:   Assessment & Plan  Obesity Weight increased to 258 lbs. Previous weight loss with Wegovy , but insurance no longer covers it. Discussed high cost and savings options. Emphasized diet's role in weight management. - Call insurance to discuss Wegovy  coverage options. - Explore savings options for Wegovy  through  drug company programs. - Encouraged dietary modifications: high protein, low carbohydrate. - Continue regular exercise, at least three times a week.  Prediabetes Lifestyle modifications crucial to prevent diabetes. Emphasized diet and exercise for glucose management. Discussed calorie deficit and age-related hormonal  challenges. - Ordered labs for cholesterol and A1c. - Encouraged dietary modifications: reduce carbohydrates, increase protein. - Promoted regular exercise for glucose management.  Hyperlipidemia - Stable, recheck labs and continue statin.   General Health Maintenance Discussed pneumonia vaccine benefits and potential side effects. Addressed concerns about side effects similar to flu vaccine. - Provided information on pneumonia vaccine. - Encouraged regular flu vaccinations despite previous adverse reactions.  - CBC w/Diff/Platelet - Comprehensive Metabolic Panel (CMET) - Lipid Profile - HgB A1c  Return in about 6 months (around 10/16/2024).    Sharyle Fischer, DO

## 2024-04-19 ENCOUNTER — Ambulatory Visit: Payer: Self-pay | Admitting: Internal Medicine

## 2024-04-19 LAB — CBC WITH DIFFERENTIAL/PLATELET
Absolute Lymphocytes: 2071 {cells}/uL (ref 850–3900)
Absolute Monocytes: 239 {cells}/uL (ref 200–950)
Basophils Absolute: 8 {cells}/uL (ref 0–200)
Basophils Relative: 0.2 %
Eosinophils Absolute: 50 {cells}/uL (ref 15–500)
Eosinophils Relative: 1.2 %
HCT: 41.8 % (ref 38.5–50.0)
Hemoglobin: 13.8 g/dL (ref 13.2–17.1)
MCH: 27.7 pg (ref 27.0–33.0)
MCHC: 33 g/dL (ref 32.0–36.0)
MCV: 83.9 fL (ref 80.0–100.0)
MPV: 11.5 fL (ref 7.5–12.5)
Monocytes Relative: 5.7 %
Neutro Abs: 1831 {cells}/uL (ref 1500–7800)
Neutrophils Relative %: 43.6 %
Platelets: 203 Thousand/uL (ref 140–400)
RBC: 4.98 Million/uL (ref 4.20–5.80)
RDW: 13 % (ref 11.0–15.0)
Total Lymphocyte: 49.3 %
WBC: 4.2 Thousand/uL (ref 3.8–10.8)

## 2024-04-19 LAB — COMPREHENSIVE METABOLIC PANEL WITH GFR
AG Ratio: 2 (calc) (ref 1.0–2.5)
ALT: 28 U/L (ref 9–46)
AST: 26 U/L (ref 10–35)
Albumin: 4.5 g/dL (ref 3.6–5.1)
Alkaline phosphatase (APISO): 37 U/L (ref 35–144)
BUN: 10 mg/dL (ref 7–25)
CO2: 28 mmol/L (ref 20–32)
Calcium: 9.3 mg/dL (ref 8.6–10.3)
Chloride: 103 mmol/L (ref 98–110)
Creat: 0.99 mg/dL (ref 0.70–1.30)
Globulin: 2.2 g/dL (ref 1.9–3.7)
Glucose, Bld: 121 mg/dL — ABNORMAL HIGH (ref 65–99)
Potassium: 4.3 mmol/L (ref 3.5–5.3)
Sodium: 140 mmol/L (ref 135–146)
Total Bilirubin: 0.3 mg/dL (ref 0.2–1.2)
Total Protein: 6.7 g/dL (ref 6.1–8.1)
eGFR: 92 mL/min/1.73m2 (ref >=60)

## 2024-04-19 LAB — LIPID PANEL
Cholesterol: 144 mg/dL (ref <200)
HDL: 52 mg/dL (ref >=40)
LDL Cholesterol (Calc): 73 mg/dL
Non-HDL Cholesterol (Calc): 92 mg/dL (ref <130)
Total CHOL/HDL Ratio: 2.8 (calc) (ref <5.0)
Triglycerides: 105 mg/dL (ref <150)

## 2024-04-19 LAB — HEMOGLOBIN A1C
Hgb A1c MFr Bld: 6 % — ABNORMAL HIGH (ref <5.7)
Mean Plasma Glucose: 126 mg/dL
eAG (mmol/L): 7 mmol/L

## 2024-10-17 ENCOUNTER — Ambulatory Visit: Admitting: Internal Medicine

## 2024-11-02 ENCOUNTER — Ambulatory Visit: Admitting: Urology
